# Patient Record
Sex: Female | Born: 1935 | Race: White | Hispanic: No | Smoking: Former smoker
Health system: Southern US, Community
[De-identification: ages and names within clinical notes are randomized; demographics above are authoritative.]

## PROBLEM LIST (undated history)

## (undated) DIAGNOSIS — Q2112 Patent foramen ovale: Secondary | ICD-10-CM

## (undated) DIAGNOSIS — M48061 Spinal stenosis, lumbar region without neurogenic claudication: Secondary | ICD-10-CM

## (undated) DIAGNOSIS — R55 Syncope and collapse: Secondary | ICD-10-CM

## (undated) DIAGNOSIS — I509 Heart failure, unspecified: Secondary | ICD-10-CM

## (undated) DIAGNOSIS — I499 Cardiac arrhythmia, unspecified: Secondary | ICD-10-CM

## (undated) DIAGNOSIS — E039 Hypothyroidism, unspecified: Secondary | ICD-10-CM

## (undated) DIAGNOSIS — K219 Gastro-esophageal reflux disease without esophagitis: Secondary | ICD-10-CM

## (undated) DIAGNOSIS — E119 Type 2 diabetes mellitus without complications: Secondary | ICD-10-CM

## (undated) DIAGNOSIS — I099 Rheumatic heart disease, unspecified: Secondary | ICD-10-CM

## (undated) DIAGNOSIS — Q211 Atrial septal defect: Secondary | ICD-10-CM

## (undated) DIAGNOSIS — I4891 Unspecified atrial fibrillation: Secondary | ICD-10-CM

## (undated) DIAGNOSIS — J449 Chronic obstructive pulmonary disease, unspecified: Secondary | ICD-10-CM

## (undated) HISTORY — PX: FOOT SURGERY: SHX648

## (undated) HISTORY — DX: Rheumatic heart disease, unspecified: I09.9

## (undated) HISTORY — DX: Unspecified atrial fibrillation: I48.91

## (undated) HISTORY — DX: Heart failure, unspecified: I50.9

## (undated) HISTORY — PX: EXPLORATORY LAPAROTOMY: SUR591

## (undated) HISTORY — DX: Chronic obstructive pulmonary disease, unspecified: J44.9

## (undated) HISTORY — DX: Patent foramen ovale: Q21.12

## (undated) HISTORY — DX: Atrial septal defect: Q21.1

## (undated) HISTORY — PX: ABDOMINAL HYSTERECTOMY: SHX81

## (undated) HISTORY — DX: Hypothyroidism, unspecified: E03.9

## (undated) HISTORY — PX: CHOLECYSTECTOMY: SHX55

## (undated) HISTORY — DX: Spinal stenosis, lumbar region without neurogenic claudication: M48.061

## (undated) HISTORY — PX: COLON SURGERY: SHX602

## (undated) HISTORY — DX: Cardiac arrhythmia, unspecified: I49.9

## (undated) HISTORY — DX: Gastro-esophageal reflux disease without esophagitis: K21.9

## (undated) HISTORY — PX: MITRAL VALVE REPLACEMENT: SHX147

---

## 1991-12-21 HISTORY — PX: PERCUTANEOUS BALLOON VALVULOPLASTY: SHX270

## 2006-10-11 ENCOUNTER — Ambulatory Visit: Payer: Self-pay | Admitting: Cardiology

## 2006-11-21 ENCOUNTER — Ambulatory Visit: Payer: Self-pay | Admitting: Cardiology

## 2006-11-24 ENCOUNTER — Ambulatory Visit: Payer: Self-pay | Admitting: Cardiology

## 2006-12-01 ENCOUNTER — Ambulatory Visit: Payer: Self-pay | Admitting: Cardiology

## 2006-12-08 ENCOUNTER — Ambulatory Visit: Payer: Self-pay | Admitting: Cardiology

## 2006-12-19 ENCOUNTER — Ambulatory Visit: Payer: Self-pay | Admitting: Cardiology

## 2007-01-02 ENCOUNTER — Ambulatory Visit: Payer: Self-pay | Admitting: Cardiology

## 2007-01-09 ENCOUNTER — Ambulatory Visit: Payer: Self-pay | Admitting: Cardiology

## 2007-01-17 ENCOUNTER — Ambulatory Visit: Payer: Self-pay | Admitting: Cardiology

## 2007-01-19 ENCOUNTER — Ambulatory Visit: Payer: Self-pay | Admitting: Cardiology

## 2007-01-30 ENCOUNTER — Ambulatory Visit: Payer: Self-pay | Admitting: Cardiology

## 2007-02-07 ENCOUNTER — Ambulatory Visit: Payer: Self-pay | Admitting: Cardiology

## 2007-02-15 ENCOUNTER — Ambulatory Visit: Payer: Self-pay | Admitting: Cardiology

## 2007-02-21 ENCOUNTER — Ambulatory Visit: Payer: Self-pay | Admitting: Cardiology

## 2007-03-01 ENCOUNTER — Ambulatory Visit: Payer: Self-pay | Admitting: Cardiology

## 2007-03-14 ENCOUNTER — Ambulatory Visit: Payer: Self-pay | Admitting: Cardiology

## 2007-03-17 ENCOUNTER — Ambulatory Visit: Payer: Self-pay | Admitting: Cardiology

## 2007-03-27 ENCOUNTER — Ambulatory Visit: Payer: Self-pay | Admitting: Cardiology

## 2007-04-11 ENCOUNTER — Ambulatory Visit: Payer: Self-pay | Admitting: Cardiology

## 2007-04-27 ENCOUNTER — Ambulatory Visit: Payer: Self-pay | Admitting: Cardiology

## 2007-05-04 ENCOUNTER — Ambulatory Visit: Payer: Self-pay | Admitting: Cardiology

## 2007-05-22 ENCOUNTER — Ambulatory Visit: Payer: Self-pay | Admitting: Cardiology

## 2007-05-31 ENCOUNTER — Ambulatory Visit: Payer: Self-pay | Admitting: Cardiology

## 2007-06-14 ENCOUNTER — Ambulatory Visit: Payer: Self-pay | Admitting: Cardiology

## 2007-06-20 ENCOUNTER — Ambulatory Visit: Payer: Self-pay | Admitting: Cardiology

## 2007-07-04 ENCOUNTER — Ambulatory Visit: Payer: Self-pay | Admitting: Cardiology

## 2007-07-11 ENCOUNTER — Ambulatory Visit: Payer: Self-pay | Admitting: Cardiology

## 2007-08-04 ENCOUNTER — Ambulatory Visit: Payer: Self-pay | Admitting: Cardiology

## 2007-08-30 ENCOUNTER — Ambulatory Visit: Payer: Self-pay | Admitting: Cardiology

## 2007-09-01 ENCOUNTER — Ambulatory Visit: Payer: Self-pay | Admitting: Cardiology

## 2007-09-27 ENCOUNTER — Ambulatory Visit: Payer: Self-pay | Admitting: Cardiology

## 2007-10-18 ENCOUNTER — Ambulatory Visit: Payer: Self-pay | Admitting: Cardiology

## 2007-11-14 ENCOUNTER — Ambulatory Visit: Payer: Self-pay | Admitting: Cardiology

## 2007-11-28 ENCOUNTER — Ambulatory Visit: Payer: Self-pay | Admitting: Cardiology

## 2007-12-20 ENCOUNTER — Ambulatory Visit: Payer: Self-pay | Admitting: Cardiology

## 2008-01-01 ENCOUNTER — Ambulatory Visit: Payer: Self-pay | Admitting: Cardiology

## 2008-01-02 ENCOUNTER — Inpatient Hospital Stay (HOSPITAL_COMMUNITY): Admission: AD | Admit: 2008-01-02 | Discharge: 2008-01-05 | Payer: Self-pay | Admitting: Internal Medicine

## 2008-01-02 ENCOUNTER — Ambulatory Visit: Payer: Self-pay | Admitting: Cardiology

## 2008-01-08 ENCOUNTER — Ambulatory Visit: Payer: Self-pay | Admitting: Internal Medicine

## 2008-01-12 ENCOUNTER — Ambulatory Visit: Payer: Self-pay | Admitting: Cardiology

## 2008-01-19 ENCOUNTER — Ambulatory Visit: Payer: Self-pay | Admitting: Cardiology

## 2008-02-02 ENCOUNTER — Ambulatory Visit: Payer: Self-pay | Admitting: Cardiology

## 2008-02-23 ENCOUNTER — Ambulatory Visit: Payer: Self-pay | Admitting: Cardiology

## 2008-03-18 ENCOUNTER — Ambulatory Visit: Payer: Self-pay | Admitting: Cardiology

## 2008-04-03 ENCOUNTER — Ambulatory Visit: Payer: Self-pay | Admitting: Cardiology

## 2008-05-01 ENCOUNTER — Ambulatory Visit: Payer: Self-pay | Admitting: Cardiology

## 2008-05-30 ENCOUNTER — Ambulatory Visit: Payer: Self-pay | Admitting: Cardiology

## 2008-07-18 ENCOUNTER — Ambulatory Visit: Payer: Self-pay | Admitting: Cardiology

## 2008-07-26 ENCOUNTER — Ambulatory Visit: Payer: Self-pay | Admitting: Cardiology

## 2008-08-16 ENCOUNTER — Ambulatory Visit: Payer: Self-pay | Admitting: Cardiology

## 2008-08-19 ENCOUNTER — Ambulatory Visit: Payer: Self-pay | Admitting: Cardiology

## 2008-09-03 ENCOUNTER — Encounter: Payer: Self-pay | Admitting: Physician Assistant

## 2008-09-03 ENCOUNTER — Ambulatory Visit: Payer: Self-pay | Admitting: Cardiology

## 2008-09-13 ENCOUNTER — Ambulatory Visit: Payer: Self-pay | Admitting: Cardiology

## 2008-09-26 ENCOUNTER — Ambulatory Visit: Payer: Self-pay | Admitting: Cardiology

## 2008-10-04 ENCOUNTER — Ambulatory Visit: Payer: Self-pay | Admitting: Cardiology

## 2008-10-08 ENCOUNTER — Ambulatory Visit: Payer: Self-pay | Admitting: Cardiology

## 2008-10-15 ENCOUNTER — Ambulatory Visit: Payer: Self-pay | Admitting: Cardiology

## 2008-10-29 ENCOUNTER — Ambulatory Visit: Payer: Self-pay | Admitting: Cardiology

## 2008-11-08 ENCOUNTER — Ambulatory Visit: Payer: Self-pay | Admitting: Cardiology

## 2008-11-26 ENCOUNTER — Ambulatory Visit: Payer: Self-pay | Admitting: Cardiology

## 2008-12-10 ENCOUNTER — Ambulatory Visit: Payer: Self-pay | Admitting: Cardiology

## 2009-01-03 ENCOUNTER — Ambulatory Visit: Payer: Self-pay | Admitting: Cardiology

## 2009-01-10 ENCOUNTER — Ambulatory Visit: Payer: Self-pay | Admitting: Cardiology

## 2009-01-30 ENCOUNTER — Encounter: Payer: Self-pay | Admitting: Cardiology

## 2009-02-04 ENCOUNTER — Ambulatory Visit: Payer: Self-pay | Admitting: Cardiology

## 2009-02-28 ENCOUNTER — Ambulatory Visit: Payer: Self-pay | Admitting: Cardiology

## 2009-03-17 ENCOUNTER — Ambulatory Visit: Payer: Self-pay | Admitting: Cardiology

## 2009-04-01 ENCOUNTER — Ambulatory Visit: Payer: Self-pay | Admitting: Cardiology

## 2009-04-03 ENCOUNTER — Ambulatory Visit: Payer: Self-pay | Admitting: Cardiology

## 2009-04-18 ENCOUNTER — Ambulatory Visit: Payer: Self-pay | Admitting: Cardiology

## 2009-04-24 ENCOUNTER — Ambulatory Visit: Payer: Self-pay | Admitting: Cardiology

## 2009-04-30 ENCOUNTER — Inpatient Hospital Stay (HOSPITAL_BASED_OUTPATIENT_CLINIC_OR_DEPARTMENT_OTHER): Admission: RE | Admit: 2009-04-30 | Discharge: 2009-04-30 | Payer: Self-pay | Admitting: Cardiology

## 2009-04-30 ENCOUNTER — Ambulatory Visit: Payer: Self-pay | Admitting: Cardiology

## 2009-05-02 ENCOUNTER — Encounter: Payer: Self-pay | Admitting: Cardiology

## 2009-05-06 ENCOUNTER — Ambulatory Visit: Payer: Self-pay | Admitting: Cardiology

## 2009-05-12 ENCOUNTER — Encounter
Admission: RE | Admit: 2009-05-12 | Discharge: 2009-05-12 | Payer: Self-pay | Admitting: Thoracic Surgery (Cardiothoracic Vascular Surgery)

## 2009-05-12 ENCOUNTER — Ambulatory Visit: Payer: Self-pay | Admitting: Thoracic Surgery (Cardiothoracic Vascular Surgery)

## 2009-05-12 ENCOUNTER — Encounter: Payer: Self-pay | Admitting: Cardiology

## 2009-05-15 ENCOUNTER — Ambulatory Visit: Payer: Self-pay | Admitting: Cardiology

## 2009-05-20 ENCOUNTER — Encounter: Payer: Self-pay | Admitting: Thoracic Surgery (Cardiothoracic Vascular Surgery)

## 2009-05-20 ENCOUNTER — Ambulatory Visit
Admission: RE | Admit: 2009-05-20 | Discharge: 2009-05-20 | Payer: Self-pay | Admitting: Thoracic Surgery (Cardiothoracic Vascular Surgery)

## 2009-05-20 ENCOUNTER — Ambulatory Visit (HOSPITAL_COMMUNITY)
Admission: RE | Admit: 2009-05-20 | Discharge: 2009-05-20 | Payer: Self-pay | Admitting: Thoracic Surgery (Cardiothoracic Vascular Surgery)

## 2009-05-20 ENCOUNTER — Encounter: Payer: Self-pay | Admitting: Cardiology

## 2009-05-23 ENCOUNTER — Ambulatory Visit: Payer: Self-pay | Admitting: Thoracic Surgery (Cardiothoracic Vascular Surgery)

## 2009-05-23 ENCOUNTER — Inpatient Hospital Stay (HOSPITAL_COMMUNITY)
Admission: RE | Admit: 2009-05-23 | Discharge: 2009-05-30 | Payer: Self-pay | Admitting: Thoracic Surgery (Cardiothoracic Vascular Surgery)

## 2009-05-23 ENCOUNTER — Encounter: Payer: Self-pay | Admitting: Thoracic Surgery (Cardiothoracic Vascular Surgery)

## 2009-05-24 ENCOUNTER — Encounter: Payer: Self-pay | Admitting: Cardiology

## 2009-05-26 ENCOUNTER — Encounter: Payer: Self-pay | Admitting: Cardiology

## 2009-05-27 ENCOUNTER — Encounter: Payer: Self-pay | Admitting: Cardiology

## 2009-05-29 ENCOUNTER — Encounter: Payer: Self-pay | Admitting: Cardiology

## 2009-05-30 ENCOUNTER — Encounter: Payer: Self-pay | Admitting: Cardiology

## 2009-06-10 ENCOUNTER — Encounter: Payer: Self-pay | Admitting: Cardiology

## 2009-06-10 ENCOUNTER — Ambulatory Visit: Payer: Self-pay | Admitting: Cardiology

## 2009-06-10 ENCOUNTER — Telehealth: Payer: Self-pay | Admitting: Cardiology

## 2009-06-13 ENCOUNTER — Encounter
Admission: RE | Admit: 2009-06-13 | Discharge: 2009-06-13 | Payer: Self-pay | Admitting: Thoracic Surgery (Cardiothoracic Vascular Surgery)

## 2009-06-13 ENCOUNTER — Ambulatory Visit: Payer: Self-pay | Admitting: Thoracic Surgery (Cardiothoracic Vascular Surgery)

## 2009-06-17 ENCOUNTER — Ambulatory Visit: Payer: Self-pay | Admitting: Cardiology

## 2009-06-20 ENCOUNTER — Ambulatory Visit: Payer: Self-pay | Admitting: Cardiology

## 2009-06-24 ENCOUNTER — Ambulatory Visit: Payer: Self-pay | Admitting: Cardiology

## 2009-07-15 ENCOUNTER — Ambulatory Visit: Payer: Self-pay | Admitting: Cardiology

## 2009-07-21 ENCOUNTER — Ambulatory Visit: Payer: Self-pay | Admitting: Thoracic Surgery (Cardiothoracic Vascular Surgery)

## 2009-08-01 ENCOUNTER — Ambulatory Visit: Payer: Self-pay | Admitting: Cardiology

## 2009-08-01 LAB — CONVERTED CEMR LAB: POC INR: 1.5

## 2009-08-04 ENCOUNTER — Encounter: Payer: Self-pay | Admitting: *Deleted

## 2009-08-08 ENCOUNTER — Ambulatory Visit: Payer: Self-pay | Admitting: Cardiology

## 2009-08-08 LAB — CONVERTED CEMR LAB: Prothrombin Time: 16.4 s

## 2009-08-20 DIAGNOSIS — R0989 Other specified symptoms and signs involving the circulatory and respiratory systems: Secondary | ICD-10-CM | POA: Insufficient documentation

## 2009-08-26 ENCOUNTER — Ambulatory Visit: Payer: Self-pay | Admitting: Cardiology

## 2009-09-19 ENCOUNTER — Ambulatory Visit: Payer: Self-pay | Admitting: Cardiology

## 2009-09-19 LAB — CONVERTED CEMR LAB: POC INR: 2.1

## 2009-10-06 ENCOUNTER — Encounter: Payer: Self-pay | Admitting: Cardiology

## 2009-10-07 ENCOUNTER — Telehealth (INDEPENDENT_AMBULATORY_CARE_PROVIDER_SITE_OTHER): Payer: Self-pay | Admitting: *Deleted

## 2009-10-07 ENCOUNTER — Encounter: Payer: Self-pay | Admitting: Physician Assistant

## 2009-10-12 ENCOUNTER — Encounter: Payer: Self-pay | Admitting: Cardiology

## 2009-10-13 ENCOUNTER — Ambulatory Visit: Payer: Self-pay | Admitting: Cardiology

## 2009-10-13 ENCOUNTER — Encounter: Payer: Self-pay | Admitting: Cardiology

## 2009-10-17 ENCOUNTER — Ambulatory Visit: Payer: Self-pay | Admitting: Cardiology

## 2009-10-17 LAB — CONVERTED CEMR LAB: POC INR: 1.3

## 2009-10-23 ENCOUNTER — Telehealth (INDEPENDENT_AMBULATORY_CARE_PROVIDER_SITE_OTHER): Payer: Self-pay | Admitting: *Deleted

## 2009-10-31 ENCOUNTER — Ambulatory Visit: Payer: Self-pay | Admitting: Cardiology

## 2009-11-10 ENCOUNTER — Ambulatory Visit: Payer: Self-pay | Admitting: Cardiology

## 2009-11-10 DIAGNOSIS — M4800 Spinal stenosis, site unspecified: Secondary | ICD-10-CM

## 2009-11-10 DIAGNOSIS — Z9889 Other specified postprocedural states: Secondary | ICD-10-CM

## 2009-11-10 DIAGNOSIS — J4489 Other specified chronic obstructive pulmonary disease: Secondary | ICD-10-CM | POA: Insufficient documentation

## 2009-11-10 DIAGNOSIS — E039 Hypothyroidism, unspecified: Secondary | ICD-10-CM | POA: Insufficient documentation

## 2009-11-10 DIAGNOSIS — I099 Rheumatic heart disease, unspecified: Secondary | ICD-10-CM | POA: Insufficient documentation

## 2009-11-10 DIAGNOSIS — I509 Heart failure, unspecified: Secondary | ICD-10-CM | POA: Insufficient documentation

## 2009-11-10 DIAGNOSIS — Z8679 Personal history of other diseases of the circulatory system: Secondary | ICD-10-CM | POA: Insufficient documentation

## 2009-11-10 DIAGNOSIS — J449 Chronic obstructive pulmonary disease, unspecified: Secondary | ICD-10-CM

## 2009-11-12 IMAGING — CR DG CHEST 2V
2 series · 2 of 2 positions shown · non-contrast
Comparison: Chest x-ray of 05/29/2009

CLINICAL DATA: Status post mitral valve replacement, follow

CHEST - 2 VIEW

[w chest ap]
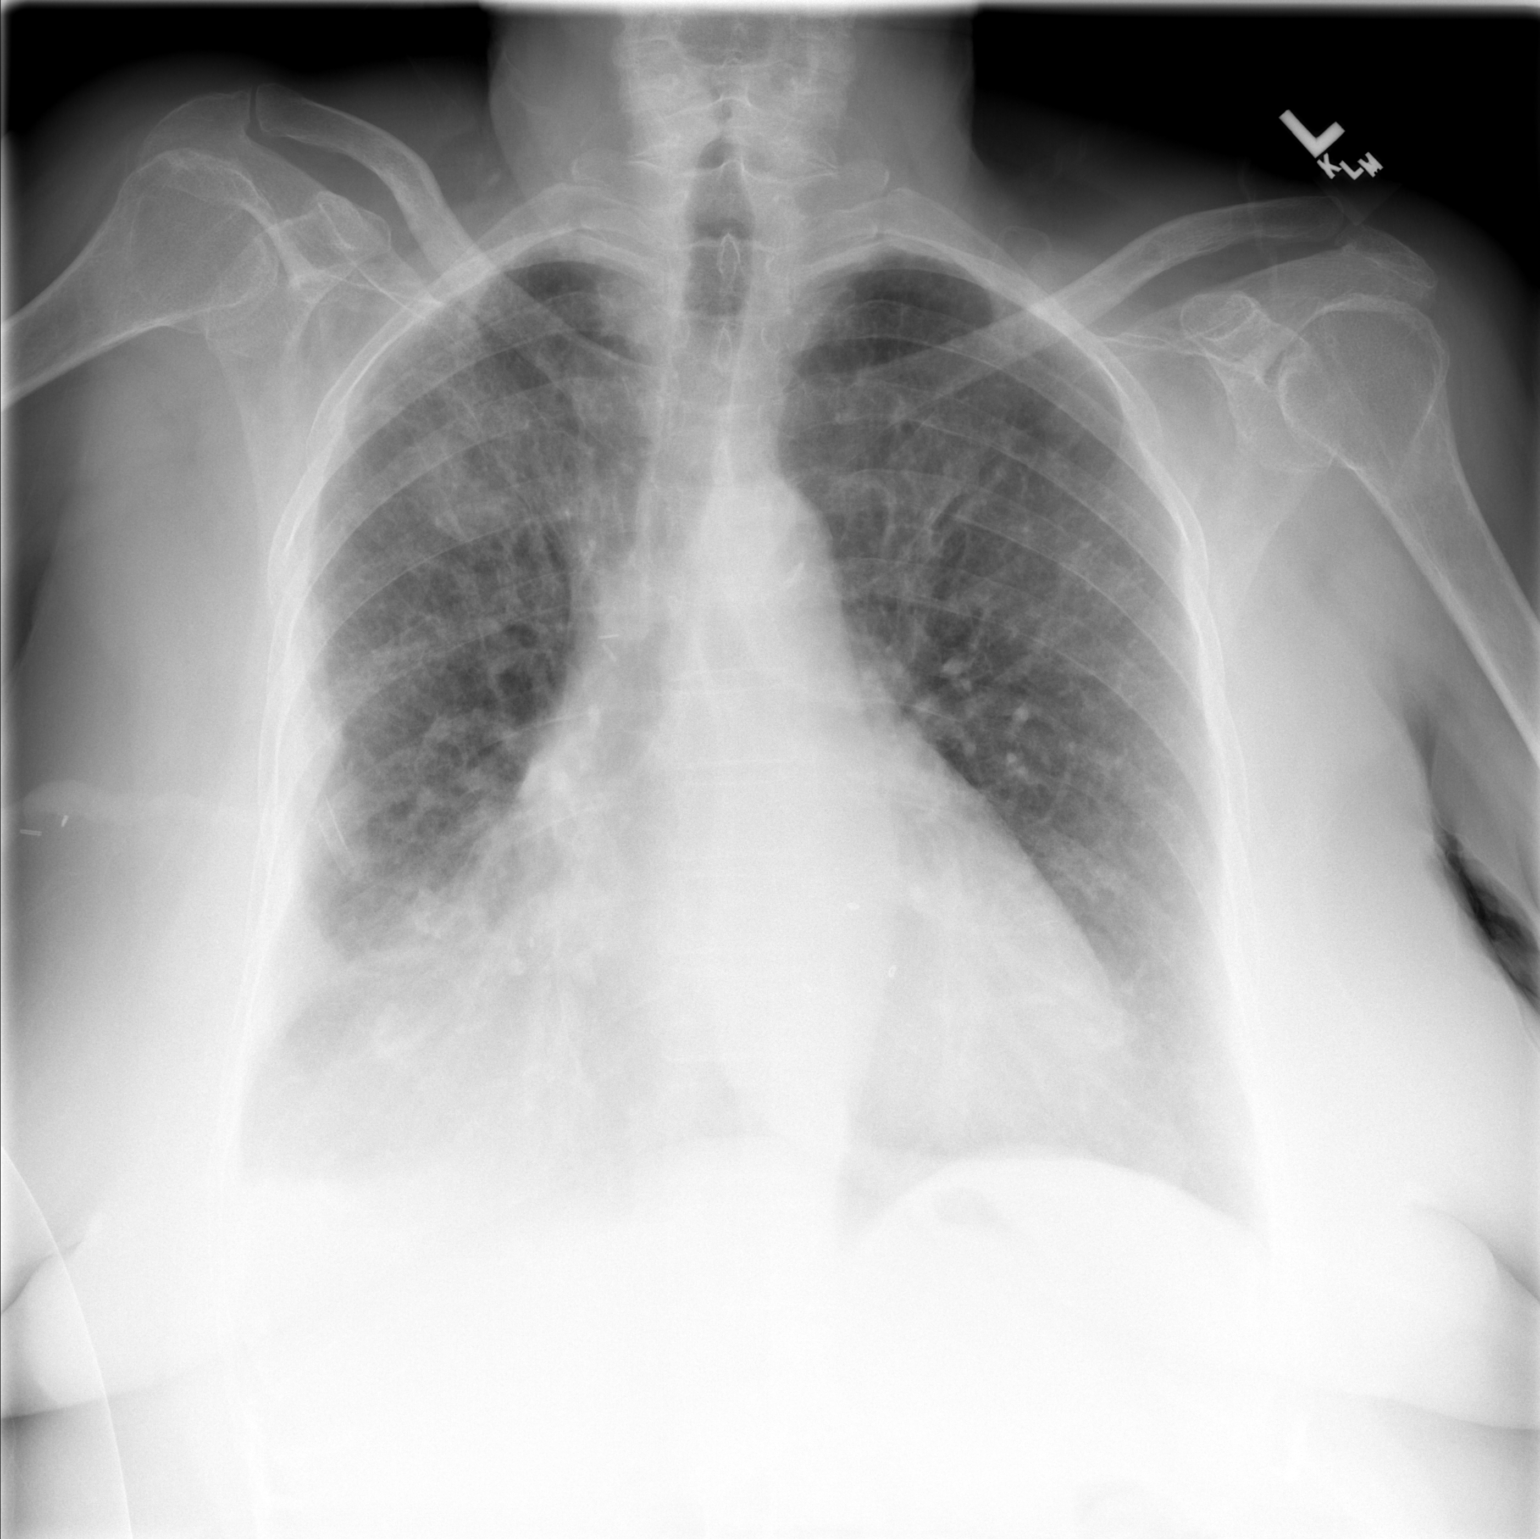

[w chest lat]
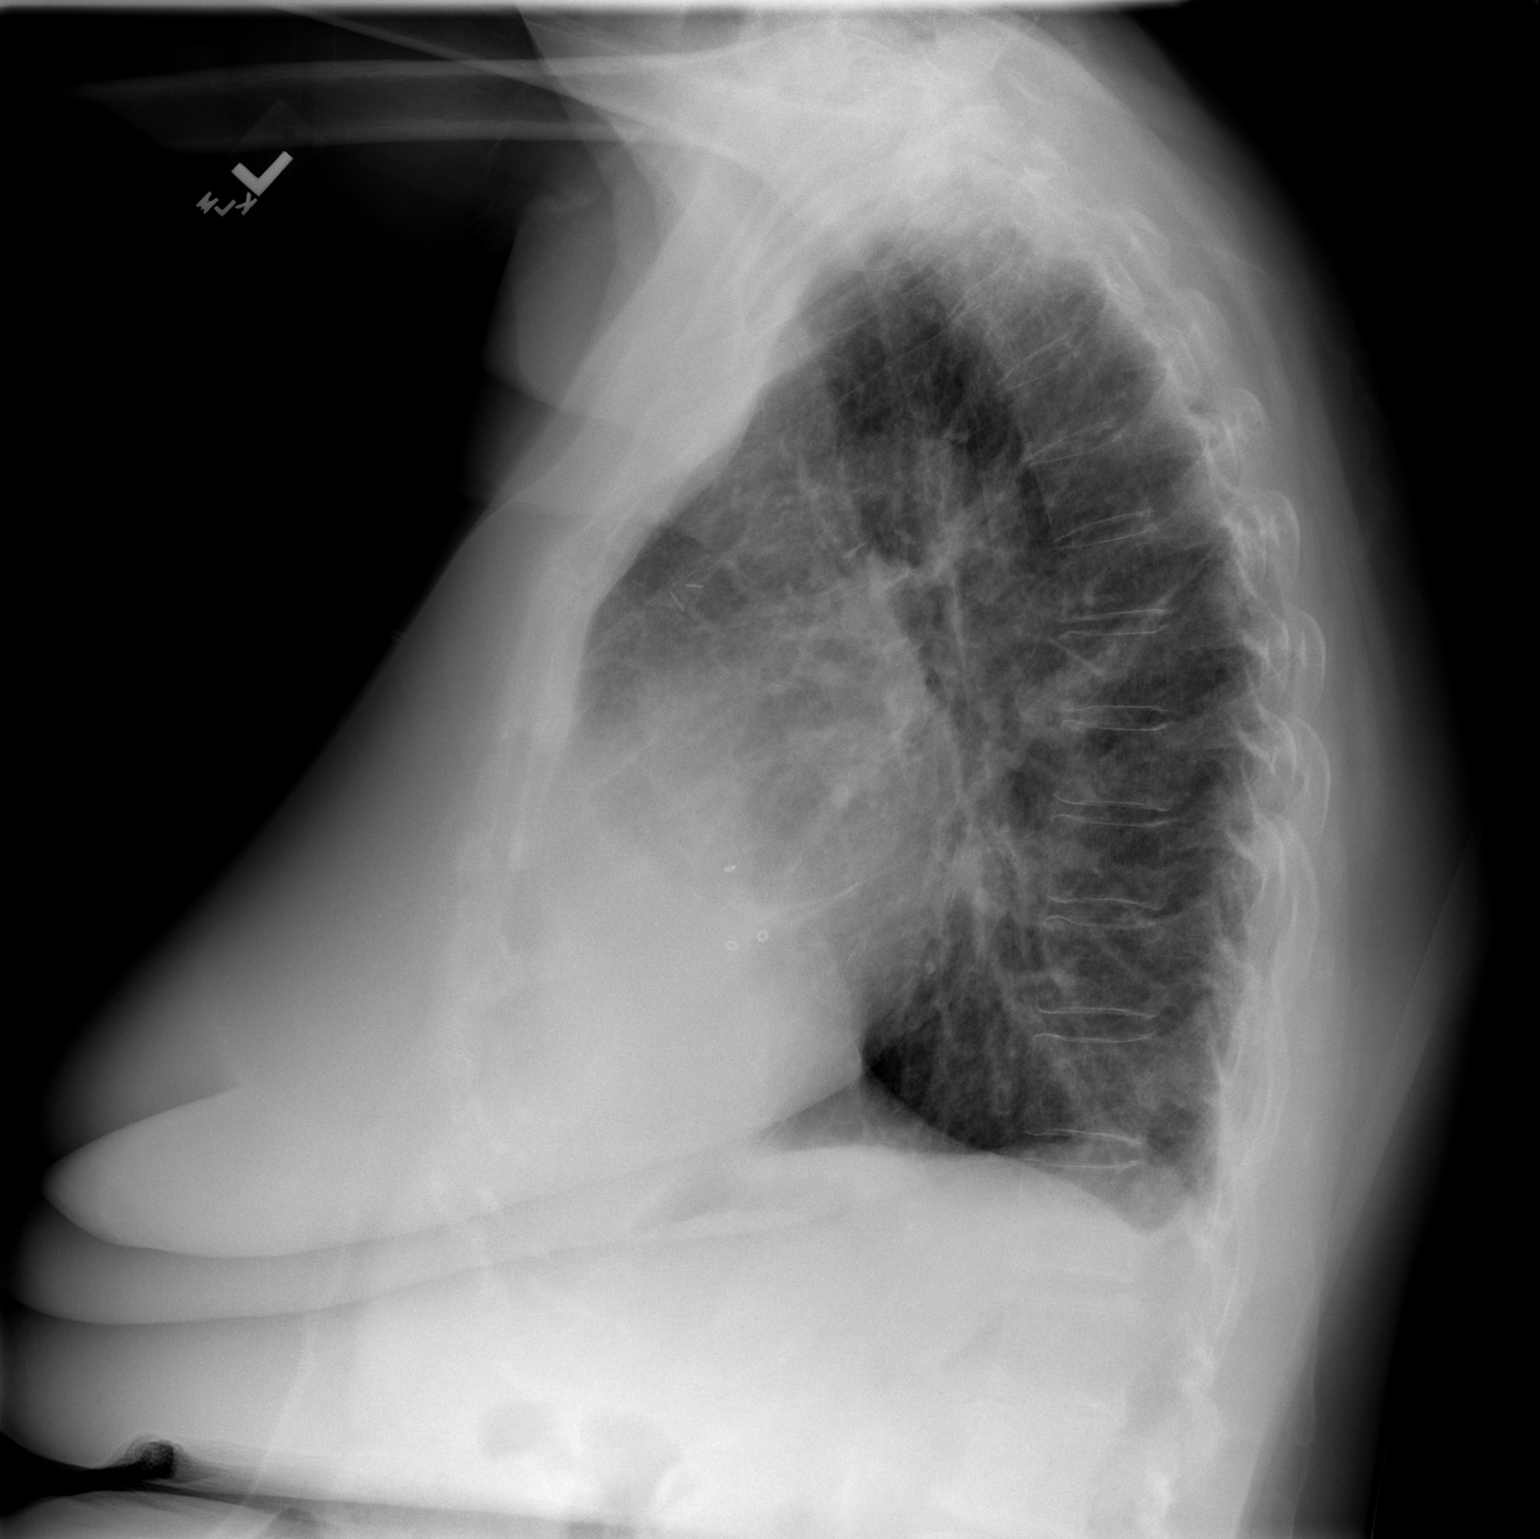

[2 of 2 positions shown; findings below may reference images not displayed]

FINDINGS: The edema pattern has improved although moderate
pulmonary vascular congestion remains.  Only small pleural
effusions are present.  Cardiomegaly is stable.  The bones are
osteopenic.
IMPRESSION: Improvement in pulmonary vascular congestion with decreasing small
effusions.

## 2009-11-16 ENCOUNTER — Encounter: Payer: Self-pay | Admitting: Cardiology

## 2009-11-21 ENCOUNTER — Encounter: Payer: Self-pay | Admitting: Cardiology

## 2009-11-26 ENCOUNTER — Telehealth (INDEPENDENT_AMBULATORY_CARE_PROVIDER_SITE_OTHER): Payer: Self-pay | Admitting: *Deleted

## 2009-12-02 ENCOUNTER — Ambulatory Visit: Payer: Self-pay | Admitting: Cardiology

## 2009-12-02 LAB — CONVERTED CEMR LAB: POC INR: 1.6

## 2009-12-23 ENCOUNTER — Ambulatory Visit: Payer: Self-pay | Admitting: Cardiology

## 2009-12-23 LAB — CONVERTED CEMR LAB: POC INR: 1.5

## 2010-01-02 ENCOUNTER — Ambulatory Visit: Payer: Self-pay | Admitting: Cardiology

## 2010-01-02 LAB — CONVERTED CEMR LAB: POC INR: 2.9

## 2010-01-23 ENCOUNTER — Ambulatory Visit: Payer: Self-pay | Admitting: Cardiology

## 2010-02-09 ENCOUNTER — Ambulatory Visit: Payer: Self-pay | Admitting: Thoracic Surgery (Cardiothoracic Vascular Surgery)

## 2010-02-14 ENCOUNTER — Encounter: Payer: Self-pay | Admitting: Cardiology

## 2010-02-20 ENCOUNTER — Ambulatory Visit: Payer: Self-pay | Admitting: Cardiology

## 2010-03-20 ENCOUNTER — Ambulatory Visit: Payer: Self-pay | Admitting: Cardiology

## 2010-04-14 ENCOUNTER — Ambulatory Visit: Payer: Self-pay | Admitting: Cardiology

## 2010-04-20 ENCOUNTER — Ambulatory Visit: Payer: Self-pay | Admitting: Cardiology

## 2010-04-20 DIAGNOSIS — F341 Dysthymic disorder: Secondary | ICD-10-CM | POA: Insufficient documentation

## 2010-05-12 ENCOUNTER — Ambulatory Visit: Payer: Self-pay | Admitting: Cardiology

## 2010-05-20 ENCOUNTER — Encounter (INDEPENDENT_AMBULATORY_CARE_PROVIDER_SITE_OTHER): Payer: Self-pay | Admitting: *Deleted

## 2010-06-12 ENCOUNTER — Ambulatory Visit: Payer: Self-pay | Admitting: Cardiology

## 2010-06-12 LAB — CONVERTED CEMR LAB: POC INR: 1.8

## 2010-07-07 ENCOUNTER — Ambulatory Visit: Payer: Self-pay | Admitting: Cardiology

## 2010-07-07 LAB — CONVERTED CEMR LAB: POC INR: 2

## 2010-08-04 ENCOUNTER — Ambulatory Visit: Payer: Self-pay | Admitting: Cardiology

## 2010-08-25 ENCOUNTER — Ambulatory Visit: Payer: Self-pay | Admitting: Cardiology

## 2010-08-25 LAB — CONVERTED CEMR LAB: POC INR: 2.7

## 2010-09-22 ENCOUNTER — Ambulatory Visit: Payer: Self-pay | Admitting: Cardiology

## 2010-09-22 LAB — CONVERTED CEMR LAB: POC INR: 2

## 2010-10-20 ENCOUNTER — Ambulatory Visit: Payer: Self-pay | Admitting: Cardiology

## 2010-10-20 LAB — CONVERTED CEMR LAB: POC INR: 1.9

## 2010-11-17 ENCOUNTER — Ambulatory Visit: Payer: Self-pay | Admitting: Cardiology

## 2010-12-18 ENCOUNTER — Ambulatory Visit: Payer: Self-pay | Admitting: Cardiology

## 2010-12-18 LAB — CONVERTED CEMR LAB: POC INR: 1.8

## 2011-01-11 ENCOUNTER — Encounter: Payer: Self-pay | Admitting: Thoracic Surgery (Cardiothoracic Vascular Surgery)

## 2011-01-12 ENCOUNTER — Ambulatory Visit: Admission: RE | Admit: 2011-01-12 | Discharge: 2011-01-12 | Payer: Self-pay | Source: Home / Self Care

## 2011-01-19 NOTE — Medication Information (Signed)
Summary: ccr-lr  Anticoagulant Therapy  Managed by: Vashti Hey, RN Referring MD: Lewayne Bunting PCP: Laural Roes MD: Andee Lineman MD, Michelle Piper Indication 1: Atrial Fibrillation (ICD-427.31) [facility] Site: [name] INR POC 2.2  Dietary changes: no    Health status changes: no    Bleeding/hemorrhagic complications: no    Recent/future hospitalizations: no    Any changes in medication regimen? no    Recent/future dental: no  Any missed doses?: no       Is patient compliant with meds? yes       Allergies: 1)  Sulfa 2)  Asa  Anticoagulation Management History:      The patient is taking warfarin and comes in today for a routine follow up visit.  Positive risk factors for bleeding include an age of 75 years or older.  The bleeding index is 'intermediate risk'.  Positive CHADS2 values include History of CHF and Age > 98 years old.  The start date was 11/19/2006.  Anticoagulation responsible provider: Andee Lineman MD, Michelle Piper.  INR POC: 2.2.  Cuvette Lot#: [id number].  Exp: 10/11.    Anticoagulation Management Assessment/Plan:      The patient's current anticoagulation dose is Warfarin sodium 5 mg tabs: Take by mouth as directed.  The target INR is 2.0-3.0.  The next INR is due 06/09/2010.  Anticoagulation instructions were given to patient.  Results were reviewed/authorized by Vashti Hey, RN.  She was notified by Vashti Hey RN.         Prior Anticoagulation Instructions: INR 3.5 Hold coumadin tonight then decrease dose to 5mg  once daily except 2.5mg  on Fridays  Current Anticoagulation Instructions: INR 2.2 Continue coumadin 5mg  once daily except 2.5mg  on Fridays

## 2011-01-19 NOTE — Medication Information (Signed)
Summary: ccr-lr  Anticoagulant Therapy  Managed by: Vashti Hey, RN Referring MD: Lewayne Bunting PCP: Laural Roes MD: Antoine Poche MD, Fayrene Fearing Indication 1: Atrial Fibrillation (ICD-427.31) Lab [facility] Site: [name] INR POC 2.7  Dietary changes: no    Health status changes: no    Bleeding/hemorrhagic complications: no    Recent/future hospitalizations: no    Any changes in medication regimen? no    Recent/future dental: no  Any missed doses?: no       Is patient compliant with meds? yes       Allergies: 1)  Sulfa 2)  Asa  Anticoagulation Management History:      The patient is taking warfarin and comes in today for a routine follow up visit.  Positive risk factors for bleeding include an age of 48 years or older.  The bleeding index is 'intermediate risk'.  Positive CHADS2 values include History of CHF and Age > 58 years old.  The start date was 11/19/2006.  Anticoagulation responsible provider: Antoine Poche MD, Fayrene Fearing.  INR POC: 2.7.  Cuvette Lot#: [id number].  Exp: 10/11.    Anticoagulation Management Assessment/Plan:      The patient's current anticoagulation dose is Warfarin sodium 5 mg tabs: Take by mouth as directed.  The target INR is 2.0-3.0.  The next INR is due 09/22/2010.  Anticoagulation instructions were given to patient.  Results were reviewed/authorized by Vashti Hey, RN.  She was notified by Vashti Hey RN.         Prior Anticoagulation Instructions: INR 1.6 Increase coumadin to 5mg  once daily except 7.5mg  on Tuesdays and Fridays  Current Anticoagulation Instructions: INR 2.7 Continue coumadin 5mg  once daily except 7.5mg  on Tuesdays and Fridays Prescriptions: WARFARIN SODIUM 5 MG TABS (WARFARIN SODIUM) Take by mouth as directed  #45 x 2   Entered by:   Vashti Hey RN   Authorized by:   Lewayne Bunting, MD, [facility]   Signed by:   Vashti Hey RN on 08/25/2010   Method used:   Electronically to        [facility] (retail)       [address]       [place], [place]  [address]       Ph: [phone]       Fax: [phone]   RxID:   [id number]

## 2011-01-19 NOTE — Medication Information (Signed)
Summary: ccr-lr  Anticoagulant Therapy  Managed by: Vashti Hey, RN Referring MD: Lewayne Bunting PCP: Laural Roes MD: Antoine Poche MD, Fayrene Fearing Indication 1: Atrial Fibrillation (ICD-427.31) Lab [facility] Site: [name] INR POC 1.6  Dietary changes: no    Health status changes: no    Bleeding/hemorrhagic complications: no    Recent/future hospitalizations: no    Any changes in medication regimen? no    Recent/future dental: no  Any missed doses?: no       Is patient compliant with meds? yes       Allergies: 1)  Sulfa 2)  Asa  Anticoagulation Management History:      The patient is taking warfarin and comes in today for a routine follow up visit.  Positive risk factors for bleeding include an age of 17 years or older.  The bleeding index is 'intermediate risk'.  Positive CHADS2 values include History of CHF and Age > 64 years old.  The start date was 11/19/2006.  Anticoagulation responsible Aaren [place]: Antoine Poche MD, Fayrene Fearing.  INR POC: 1.6.  Cuvette Lot#: [id number].  Exp: 10/11.    Anticoagulation Management Assessment/Plan:      The patient's current anticoagulation dose is Warfarin sodium 5 mg tabs: Take by mouth as directed.  The target INR is 2.0-3.0.  The next INR is due 08/25/2010.  Anticoagulation instructions were given to patient.  Results were reviewed/authorized by Vashti Hey, RN.  She was notified by Vashti Hey RN.         Prior Anticoagulation Instructions: INR 2.0 Continue coumadin 5mg  once daily   Current Anticoagulation Instructions: INR 1.6 Increase coumadin to 5mg  once daily except 7.5mg  on Tuesdays and Fridays

## 2011-01-19 NOTE — Medication Information (Signed)
Summary: ccr-lr  Anticoagulant Therapy  Managed by: Vashti Hey, RN Referring MD: Lewayne Bunting PCP: Laural Roes MD: Diona Browner MD, Remi Deter Indication 1: Atrial Fibrillation (ICD-427.31) [facility] Site: [name] INR POC 2.3  Dietary changes: no    Health status changes: no    Bleeding/hemorrhagic complications: no    Recent/future hospitalizations: no    Any changes in medication regimen? no    Recent/future dental: no  Any missed doses?: no       Is patient compliant with meds? yes       Allergies: 1)  Sulfa 2)  Asa  Anticoagulation Management History:      The patient is taking warfarin and comes in today for a routine follow up visit.  Positive risk factors for bleeding include an age of 75 years or older.  The bleeding index is 'intermediate risk'.  Positive CHADS2 values include History of CHF and Age > 75 years old.  The start date was 11/19/2006.  Anticoagulation responsible provider: Diona Browner MD, Remi Deter.  INR POC: 2.3.  Cuvette Lot#: [id number].  Exp: 10/11.    Anticoagulation Management Assessment/Plan:      The patient's current anticoagulation dose is Warfarin sodium 5 mg tabs: Take by mouth as directed.  The target INR is 2.0-3.0.  The next INR is due 02/20/2010.  Anticoagulation instructions were given to patient.  Results were reviewed/authorized by Vashti Hey, RN.  She was notified by Vashti Hey RN.         Prior Anticoagulation Instructions: INR 2.9 Continue coumadin 5mg  once daily   Current Anticoagulation Instructions: INR 2.3 Continue coumadin 5mg  once daily

## 2011-01-19 NOTE — Medication Information (Signed)
Summary: ccr-lr  Anticoagulant Therapy  Managed by: Vashti Hey, RN Referring MD: Lewayne Bunting PCP: Laural Roes MD: Andee Lineman MD, Michelle Piper Indication 1: Atrial Fibrillation (ICD-427.31) Lab [facility] [place] Site: [name] INR POC 2.2  Dietary changes: no    Health status changes: yes       Details: has shingles  Bleeding/hemorrhagic complications: no    Recent/future hospitalizations: no    Any changes in medication regimen? yes       Details: started on zovirax 800mg  1 tablet 5 x day  Recent/future dental: no  Any missed doses?: no       Is patient compliant with meds? yes       Allergies: 1)  Sulfa 2)  Asa  Anticoagulation Management History:      The patient is taking warfarin and comes in today for a routine follow up visit.  Positive risk factors for bleeding include an age of 75 years or older.  The bleeding index is 'intermediate risk'.  Positive CHADS2 values include History of CHF and Age > 31 years old.  The start date was 11/19/2006.  Anticoagulation responsible provider: Andee Lineman MD, Michelle Piper.  INR POC: 2.2.  Cuvette Lot#: [id number].  Exp: 10/11.    Anticoagulation Management Assessment/Plan:      The patient's current anticoagulation dose is Warfarin sodium 5 mg tabs: Take by mouth as directed.  The target INR is 2.0-3.0.  The next INR is due 03/20/2010.  Anticoagulation instructions were given to patient.  Results were reviewed/authorized by Vashti Hey, RN.  She was notified by Vashti Hey RN.         Prior Anticoagulation Instructions: INR 2.3 Continue coumadin 5mg  once daily   Current Anticoagulation Instructions: INR 2.2 Continue coumadin 5mg  once daily

## 2011-01-19 NOTE — Medication Information (Signed)
Summary: ccr-lr  Anticoagulant Therapy  Managed by: Vashti Hey, RN Referring MD: Lewayne Bunting PCP: Laural Roes MD: Andee Lineman MD, Michelle Piper Indication 1: Atrial Fibrillation (ICD-427.31) Lab [facility] Site: [name] INR POC 3.5  Dietary changes: no    Health status changes: no    Bleeding/hemorrhagic complications: no    Recent/future hospitalizations: no    Any changes in medication regimen? no    Recent/future dental: no  Any missed doses?: no       Is patient compliant with meds? yes       Allergies: 1)  Sulfa 2)  Asa  Anticoagulation Management History:      The patient is taking warfarin and comes in today for a routine follow up visit.  Positive risk factors for bleeding include an age of 75 years or older.  The bleeding index is 'intermediate risk'.  Positive CHADS2 values include History of CHF and Age > 75 years old.  The start date was 11/19/2006.  Anticoagulation responsible provider: Andee Lineman MD, Michelle Piper.  INR POC: 3.5.  Exp: 10/11.    Anticoagulation Management Assessment/Plan:      The patient's current anticoagulation dose is Warfarin sodium 5 mg tabs: Take by mouth as directed.  The target INR is 2.0-3.0.  The next INR is due 05/12/2010.  Anticoagulation instructions were given to patient.  Results were reviewed/authorized by Vashti Hey, RN.  She was notified by Vashti Hey RN.         Prior Anticoagulation Instructions: INR 3.4 Hold coumadin tonight then resume 5mg  once daily   Current Anticoagulation Instructions: INR 3.5 Hold coumadin tonight then decrease dose to 5mg  once daily except 2.5mg  on Fridays

## 2011-01-19 NOTE — Medication Information (Signed)
Summary: ccr-lr  Anticoagulant Therapy  Managed by: Weston Brass, PharmD Referring MD: Lewayne Bunting PCP: Laural Roes MD: Andee Lineman MD, Michelle Piper Indication 1: Atrial Fibrillation (ICD-427.31) [facility]  Site: [name] INR POC 1.8  Dietary changes: no    Health status changes: no    Bleeding/hemorrhagic complications: no    Recent/future hospitalizations: no    Any changes in medication regimen? no    Recent/future dental: no  Any missed doses?: no       Is patient compliant with meds? yes       Allergies: 1)  Sulfa 2)  Asa  Anticoagulation Management History:      The patient is taking warfarin and comes in today for a routine follow up visit.  Positive risk factors for bleeding include an age of 75 years or older.  The bleeding index is 'intermediate risk'.  Positive CHADS2 values include History of CHF and Age > 69 years old.  The start date was 11/19/2006.  Anticoagulation responsible provider: Andee Lineman MD, Michelle Piper.  INR POC: 1.8.  Cuvette Lot#: [id number].  Exp: 10/11.    Anticoagulation Management Assessment/Plan:      The patient's current anticoagulation dose is Warfarin sodium 5 mg tabs: Take by mouth as directed.  The target INR is 2.0-3.0.  The next INR is due 07/03/2010.  Anticoagulation instructions were given to patient.  Results were reviewed/authorized by Weston Brass, PharmD.  She was notified by Weston Brass PharmD.         Prior Anticoagulation Instructions: INR 2.2 Continue coumadin 5mg  once daily except 2.5mg  on Fridays  Current Anticoagulation Instructions: INR 1.8  Increase dose to 1 tablet every day.  Recheck in 3 weeks.

## 2011-01-19 NOTE — Medication Information (Signed)
Summary: ccr-lr  Anticoagulant Therapy  Managed by: Vashti Hey, RN Referring MD: Lewayne Bunting PCP: Laural Roes MD: Diona Browner MD, Remi Deter Indication 1: Atrial Fibrillation (ICD-427.31) Lab [facility] [place] [facility] Site: [name] INR POC 3.4  Dietary changes: no    Health status changes: no    Bleeding/hemorrhagic complications: no    Recent/future hospitalizations: no    Any changes in medication regimen? no    Recent/future dental: no  Any missed doses?: no       Is patient compliant with meds? yes       Allergies: 1)  Sulfa 2)  Asa  Anticoagulation Management History:      The patient is taking warfarin and comes in today for a routine follow up visit.  Positive risk factors for bleeding include an age of 75 years or older.  The bleeding index is 'intermediate risk'.  Positive CHADS2 values include History of CHF and Age > 72 years old.  The start date was 11/19/2006.  Anticoagulation responsible provider: Diona Browner MD, Remi Deter.  INR POC: 3.4.  Cuvette Lot#: [id number].  Exp: 10/11.    Anticoagulation Management Assessment/Plan:      The patient's current anticoagulation dose is Warfarin sodium 5 mg tabs: Take by mouth as directed.  The target INR is 2.0-3.0.  The next INR is due 04/10/2010.  Anticoagulation instructions were given to patient.  Results were reviewed/authorized by Vashti Hey, RN.  She was notified by Vashti Hey RN.         Prior Anticoagulation Instructions: INR 2.2 Continue coumadin 5mg  once daily   Current Anticoagulation Instructions: INR 3.4 Hold coumadin tonight then resume 5mg  once daily

## 2011-01-19 NOTE — Medication Information (Signed)
Summary: ccr-lr  Anticoagulant Therapy  Managed by: Vashti Hey, RN Referring MD: Lewayne Bunting PCP: Laural Roes MD: Andee Lineman MD, Michelle Piper Indication 1: Atrial Fibrillation (ICD-427.31) Lab [facility] [place] Site: [name] INR POC 1.5  Dietary changes: no    Health status changes: no    Bleeding/hemorrhagic complications: no    Recent/future hospitalizations: no    Any changes in medication regimen? no    Recent/future dental: no  Any missed doses?: yes     Details: pt denies missing doses but INR continues to drop inspite of increasing dose  Is patient compliant with meds? yes       Allergies: 1)  Sulfa 2)  Asa  Anticoagulation Management History:      The patient is taking warfarin and comes in today for a routine follow up visit.  Positive risk factors for bleeding include an age of 75 years or older.  The bleeding index is 'intermediate risk'.  Positive CHADS2 values include History of CHF and Age > 53 years old.  The start date was 11/19/2006.  Anticoagulation responsible provider: Andee Lineman MD, Michelle Piper.  INR POC: 1.5.  Cuvette Lot#: [id number].  Exp: 10/11.    Anticoagulation Management Assessment/Plan:      The patient's current anticoagulation dose is Warfarin sodium 5 mg tabs: Take by mouth as directed.  The target INR is 2.0-3.0.  The next INR is due 12/30/2009.  Anticoagulation instructions were given to patient.  Results were reviewed/authorized by Vashti Hey, RN.  She was notified by Vashti Hey RN.         Prior Anticoagulation Instructions: INR 1.6 Take coumadin 7.5mg  tonight then increase dose to 5mg  once daily except 2.5mg  on Mondays and Fridays  Current Anticoagulation Instructions: INR 1.5 Take coumadin 7.5mg  x 2 days then increase dose to 5mg  once daily and recheck 12/30/09

## 2011-01-19 NOTE — Medication Information (Signed)
Summary: ccr-lr  Anticoagulant Therapy  Managed by: Vashti Hey, RN Referring MD: Lewayne Bunting PCP: Laural Roes MD: Shirlee Latch MD, Dalton Indication 1: Atrial Fibrillation (ICD-427.31) [facility] [place] Site: [name] INR POC 1.9  Dietary changes: no    Health status changes: no    Bleeding/hemorrhagic complications: no    Recent/future hospitalizations: no    Any changes in medication regimen? no    Recent/future dental: no  Any missed doses?: no       Is patient compliant with meds? yes       Allergies: 1)  Sulfa 2)  Asa  Anticoagulation Management History:      The patient is taking warfarin and comes in today for a routine follow up visit.  Positive risk factors for bleeding include an age of 75 years or older.  The bleeding index is 'intermediate risk'.  Positive CHADS2 values include History of CHF and Age > 80 years old.  The start date was 11/19/2006.  Anticoagulation responsible provider: Shirlee Latch MD, [place].  INR POC: 1.9.  Cuvette Lot#: [id number].  Exp: 10/11.    Anticoagulation Management Assessment/Plan:      The patient's current anticoagulation dose is Warfarin sodium 5 mg tabs: Take by mouth as directed.  The target INR is 2.0-3.0.  The next INR is due 12/18/2010.  Anticoagulation instructions were given to patient.  Results were reviewed/authorized by Vashti Hey, RN.  She was notified by Vashti Hey RN.         Prior Anticoagulation Instructions: INR 1.9 Take coumadin 2 tablets tonight then resume 1 tablet once daily except 1 1/2 tablets on Tuesdays and Fridays  Current Anticoagulation Instructions: INR 1.9 Take coumadin 2 tablets tonight then increase dose to 1 tablet once daily except 1 1/2 tablets on T,Th,Sat

## 2011-01-19 NOTE — Medication Information (Signed)
Summary: rov/sp  Anticoagulant Therapy  Managed by: Vashti Hey, RN Referring MD: Lewayne Bunting PCP: Laural Roes MD: Myrtis Ser MD, Tinnie Gens Indication 1: Atrial Fibrillation (ICD-427.31) Lab [facility] [place] Site: [name] INR POC 2.0  Dietary changes: no    Health status changes: no    Bleeding/hemorrhagic complications: no    Recent/future hospitalizations: no    Any changes in medication regimen? no    Recent/future dental: no  Any missed doses?: no       Is patient compliant with meds? yes       Allergies: 1)  Sulfa 2)  Asa  Anticoagulation Management History:      The patient is taking warfarin and comes in today for a routine follow up visit.  Positive risk factors for bleeding include an age of 75 years or older.  The bleeding index is 'intermediate risk'.  Positive CHADS2 values include History of CHF and Age > 69 years old.  The start date was 11/19/2006.  Anticoagulation responsible provider: Myrtis Ser MD, Tinnie Gens.  INR POC: 2.0.  Cuvette Lot#: [id number].  Exp: 10/11.    Anticoagulation Management Assessment/Plan:      The patient's current anticoagulation dose is Warfarin sodium 5 mg tabs: Take by mouth as directed.  The target INR is 2.0-3.0.  The next INR is due 08/04/2010.  Anticoagulation instructions were given to patient.  Results were reviewed/authorized by Vashti Hey, RN.  She was notified by Vashti Hey RN.         Prior Anticoagulation Instructions: INR 1.8  Increase dose to 1 tablet every day.  Recheck in 3 weeks.   Current Anticoagulation Instructions: INR 2.0 Continue coumadin 5mg  once daily

## 2011-01-19 NOTE — Letter (Signed)
Summary: Appointment- Rescheduled  [facility] [address]   [place], [place] [address]   Phone: [phone]  Fax: [phone]     May 20, 2010 MRN: [id number]     [name] [address] [place], [place]  [address]     Dear [name],   Due to a change in our office schedule, your appointment on June 09, 2010 at 8:30 a.m. must be changed.  Your new appointment is June 12, 2010 at 8:30 a.m.   We look forward to participating in your health care needs.        Sincerely,  Glass blower/designer

## 2011-01-19 NOTE — Medication Information (Signed)
Summary: ccr-lr  Anticoagulant Therapy  Managed by: Vashti Hey, RN Referring MD: Lewayne Bunting PCP: Laural Roes MD: Myrtis Ser MD, Tinnie Gens Indication 1: Atrial Fibrillation (ICD-427.31) Lab [facility] [place] Site: [name] INR POC 2.0  Dietary changes: no    Health status changes: no    Bleeding/hemorrhagic complications: no    Recent/future hospitalizations: no    Any changes in medication regimen? no    Recent/future dental: no  Any missed doses?: no       Is patient compliant with meds? yes       Allergies: 1)  Sulfa 2)  Asa  Anticoagulation Management History:      The patient is taking warfarin and comes in today for a routine follow up visit.  Positive risk factors for bleeding include an age of 75 years or older.  The bleeding index is 'intermediate risk'.  Positive CHADS2 values include History of CHF and Age > 45 years old.  The start date was 11/19/2006.  Anticoagulation responsible provider: Myrtis Ser MD, Tinnie Gens.  INR POC: 2.0.  Cuvette Lot#: [id number].  Exp: 10/11.    Anticoagulation Management Assessment/Plan:      The patient's current anticoagulation dose is Warfarin sodium 5 mg tabs: Take by mouth as directed.  The target INR is 2.0-3.0.  The next INR is due 10/20/2010.  Anticoagulation instructions were given to patient.  Results were reviewed/authorized by Vashti Hey, RN.  She was notified by Vashti Hey RN.         Prior Anticoagulation Instructions: INR 2.7 Continue coumadin 5mg  once daily except 7.5mg  on Tuesdays and Fridays  Current Anticoagulation Instructions: INR 2.0 Continue coumadin 5mg  once daily except 7.5mg  on Tuesdays and Fridays

## 2011-01-19 NOTE — Assessment & Plan Note (Signed)
Summary: 6 MONTH FU   Visit Type:  Follow-up Primary Provider:  Hasanaj  CC:  follow-up visit.  History of Present Illness: the patient is an 77 year old female with a history of rheumatic heart disease, status post mitral valve replacement and Cox Maze procedure for atrial fibrillation. The patient has remained in normal sinus rhythm on amiodarone. She denies any chest pain, palpitations orthopnea or PND. She does state that she gets short of breath  or minimal exertion. She feels very depressed. She has frequent crying spells. She also significant insomnia. She has lost a son in [place] and another son in [place].   She denies any syncopal or syncopal spells. She reports that is becoming increasingly difficult for her to go to her doctor's visits due to limited financial means. She stated she is unable to drive to [facility] to see her cardiac surgeon in followup. She also states she will have difficulty coming to [facility].I told herr her that maybe we could consider a house call in the future. I also suggested to her to get a primary care physician in [place]. She does not want to see her doctor in [place] anymore.    Preventive Screening-Counseling & Management  Alcohol-Tobacco     Smoking Status: quit     Year Quit: 1990  Current Medications (verified): 1)  Warfarin Sodium 5 Mg Tabs (Warfarin Sodium) .... Take By Mouth As Directed 2)  Metoprolol Tartrate 25 Mg Tabs (Metoprolol Tartrate) .... Take 1/2 Tablet By Mouth Two Times A Day 3)  Synthroid 100 Mcg Tabs (Levothyroxine Sodium) .... Take 1 Tablet By Mouth Once A Day 4)  Fish Oil 1000 Mg Caps (Omega-3 Fatty Acids) .... Take 1 Tablet By Mouth Once A Day 5)  Lasix 40 Mg Tabs (Furosemide) .... Take 1 Tablet By Mouth Once A Day 6)  Humulin N 100 Unit/ml Susp (Insulin Isophane Human) .... Use As Directed 7)  Klor-Con 10 10 Meq Cr-Tabs (Potassium Chloride) .... Take 1 Tablet By Mouth Once A Day 8)  Ativan 0.5 Mg Tabs (Lorazepam) .... Take  1 Tablet By Mouth Two Times A Day 9)  Vitamin D 400 Unit Caps (Cholecalciferol) .... Take 2 Tablet By Mouth Once A Day 10)  Ranitidine Hcl 300 Mg Caps (Ranitidine Hcl) .... Take 1 Tablet By Mouth Two Times A Day 11)  Citalopram Hydrobromide 20 Mg Tabs (Citalopram Hydrobromide) .... Take 1 Tablet By Mouth Every Morning  Allergies (verified): 1)  Sulfa 2)  Asa  Comments:  Nurse/Medical Assistant: The patient's medications and allergies were reviewed with the patient and were updated in the Medication and Allergy Lists. List reviewed.  Past History:  Past Medical History: Last updated: 11/10/2009 1. Rheumatic heart disease status post right thoracotomy and mitral     valve replacement with a Medtronic Mosaic porcine tissue valve. 2. Status post Cox CryoMaze procedure with closure of patent foramen     ovale. 3. History of congestive heart failure. 4. Spinal stenosis with severe degenerative disk disease of the     lumbosacral spine. 5. Hypothyroidism. 6. Chronic obstructive pulmonary disease. 7. Gastroesophageal reflux disease. 8. Status post balloon mitral valvuloplasty in 1993. 9. Sinus tachycardia.  Past Surgical History: Last updated: 08/20/2009 Exploratory laparotomy Left foot surgery Balloon valvuloplasty, mitral valve 1993  Family History: Last updated: 11/10/2009 Negative FH of Diabetes, Hypertension, or Coronary Artery Disease  Social History: Last updated: 08/20/2009 Widowed  Tobacco Use - Former.  Alcohol Use - no  Risk Factors: Smoking Status: quit (04/20/2010)  Review of Systems       The patient complains of fatigue and depression.  The patient denies malaise, fever, weight gain/loss, vision loss, decreased hearing, hoarseness, chest pain, palpitations, shortness of breath, prolonged cough, wheezing, sleep apnea, coughing up blood, abdominal pain, blood in stool, nausea, vomiting, diarrhea, heartburn, incontinence, blood in urine, muscle weakness,  joint pain, leg swelling, rash, skin lesions, headache, fainting, dizziness, anxiety, enlarged lymph nodes, easy bruising or bleeding, and environmental allergies.    Vital Signs:  Patient profile:   77 year old female Height:      63 inches Weight:      165 pounds Pulse rate:   64 / minute BP sitting:   135 / 73  (left arm) Cuff size:   regular  Vitals Entered By: Carlye Grippe (Apr 20, 2010 8:56 AM) CC: follow-up visit   Physical Exam  Additional Exam:  General: Well-developed, well-nourished in no distress head: Normocephalic and atraumatic eyes PERRLA/EOMI intact, conjunctiva and lids normal nose: No deformity or lesions mouth normal dentition, normal posterior pharynx neck: Supple, no JVD.  No masses, thyromegaly or abnormal cervical nodes lungs: Normal breath sounds bilaterally without wheezing.  Normal percussion heart: regular rate and rhythm with normal S1 and S2, no S3 or S4.  PMI is normal.  No pathological murmurs abdomen: Normal bowel sounds, abdomen is soft and nontender without masses, organomegaly or hernias noted.  No hepatosplenomegaly musculoskeletal: Back normal, normal gait muscle strength and tone normal pulsus: Pulse is normal in all 4 extremities Extremities: No peripheral pitting edema neurologic: Alert and oriented x 3 skin: Intact without lesions or rashes cervical nodes: No significant adenopathy psychologic: Normal affect    Impression & Recommendations:  Problem # 1:  ATRIAL FIBRILLATION, PAROXYSMAL, HX OF (ICD-V12.50) the patient is status post Maze procedure. We will discontinue amiodarone. Orders: EKG w/ Interpretation (93000)  Problem # 2:  COUMADIN THERAPY (ICD-V58.61) continue Coumadin therapy.  Problem # 3:  SPINAL STENOSIS (ICD-724.00) Assessment: Comment Only  Problem # 4:  DEPRESSION, SITUATIONAL, ACUTE (ICD-300.4) the patient has significant depression. She has gone to hard times having lost 2 sons. Financially also she is  having a hard time. The patient will be started oncitalopram 20 mg p.o. q. daily  Patient Instructions: 1)  Stop Amiodarone 2)  Citalopram 20mg  daily 3)  Follow up in  as needed  Prescriptions: METOPROLOL TARTRATE 25 MG TABS (METOPROLOL TARTRATE) Take 1/2 tablet by mouth two times a day  #90 x 3   Entered by:   Hoover Brunette, LPN   Authorized by:   Lewayne Bunting, MD, [facility]   Signed by:   Hoover Brunette, LPN on 16/09/9603   Method used:   Electronically to        [facility] (retail)       [address]       [place], [place]  [address]       Ph: [phone]       Fax: [phone]   RxID:   [id number] CITALOPRAM HYDROBROMIDE 20 MG TABS (CITALOPRAM HYDROBROMIDE) Take 1 tablet by mouth every morning  #30 x 3   Entered by:   Hoover Brunette, LPN   Authorized by:   Lewayne Bunting, MD, [facility]   Signed by:   Hoover Brunette, LPN on 10/16/2535   Method used:   Electronically to        [facility] (retail)       [address]  [place], [place]  [address]       Ph: [phone]       Fax: [phone]   RxID:   [id number]   Handout requested.

## 2011-01-19 NOTE — Medication Information (Signed)
Summary: ccr-srs  Anticoagulant Therapy  Managed by: Vashti Hey, RN Referring MD: Lewayne Bunting PCP: Laural Roes MD: Diona Browner MD, Remi Deter Indication 1: Atrial Fibrillation (ICD-427.31) [name] Site: [name] INR POC 2.9  Dietary changes: no    Health status changes: no    Bleeding/hemorrhagic complications: no    Recent/future hospitalizations: no    Any changes in medication regimen? yes       Details: Taking tylenol on flu  Recent/future dental: no  Any missed doses?: no       Is patient compliant with meds? yes       Allergies: 1)  Sulfa 2)  Asa  Anticoagulation Management History:      The patient is taking warfarin and comes in today for a routine follow up visit.  Positive risk factors for bleeding include an age of 12 years or older.  The bleeding index is 'intermediate risk'.  Positive CHADS2 values include History of CHF and Age > 18 years old.  The start date was 11/19/2006.  Anticoagulation responsible provider: Diona Browner MD, Remi Deter.  INR POC: 2.9.  Cuvette Lot#: [id number].  Exp: 10/11.    Anticoagulation Management Assessment/Plan:      The patient's current anticoagulation dose is Warfarin sodium 5 mg tabs: Take by mouth as directed.  The target INR is 2.0-3.0.  The next INR is due 01/23/2010.  Anticoagulation instructions were given to patient.  Results were reviewed/authorized by Vashti Hey, RN.  She was notified by Vashti Hey RN.         Prior Anticoagulation Instructions: INR 1.5 Take coumadin 7.5mg  x 2 days then increase dose to 5mg  once daily and recheck 12/30/09  Current Anticoagulation Instructions: INR 2.9 Continue coumadin 5mg  once daily

## 2011-01-19 NOTE — Medication Information (Signed)
Summary: ccr-lr  Anticoagulant Therapy  Managed by: Vashti Hey, RN Referring MD: Lewayne Bunting PCP: Laural Roes MD: Andee Lineman MD, Michelle Piper Indication 1: Atrial Fibrillation (ICD-427.31) [facility] Site: [name] INR POC 1.9  Dietary changes: no    Health status changes: no    Bleeding/hemorrhagic complications: no    Recent/future hospitalizations: no    Any changes in medication regimen? no    Recent/future dental: no  Any missed doses?: no       Is patient compliant with meds? yes       Allergies: 1)  Sulfa 2)  Asa  Anticoagulation Management History:      The patient is taking warfarin and comes in today for a routine follow up visit.  Positive risk factors for bleeding include an age of 75 years or older.  The bleeding index is 'intermediate risk'.  Positive CHADS2 values include History of CHF and Age > 75 years old.  The start date was 11/19/2006.  Anticoagulation responsible provider: Andee Lineman MD, Michelle Piper.  INR POC: 1.9.  Cuvette Lot#: [id number].  Exp: 10/11.    Anticoagulation Management Assessment/Plan:      The patient's current anticoagulation dose is Warfarin sodium 5 mg tabs: Take by mouth as directed.  The target INR is 2.0-3.0.  The next INR is due 11/17/2010.  Anticoagulation instructions were given to patient.  Results were reviewed/authorized by Vashti Hey, RN.  She was notified by Vashti Hey RN.         Prior Anticoagulation Instructions: INR 2.0 Continue coumadin 5mg  once daily except 7.5mg  on Tuesdays and Fridays  Current Anticoagulation Instructions: INR 1.9 Take coumadin 2 tablets tonight then resume 1 tablet once daily except 1 1/2 tablets on Tuesdays and Fridays

## 2011-01-21 NOTE — Medication Information (Signed)
Summary: ccr-lr  Anticoagulant Therapy  Managed by: Vashti Hey, RN Referring MD: Lewayne Bunting PCP: Laural Roes MD: Diona Browner MD, Remi Deter Indication 1: Atrial Fibrillation (ICD-427.31) Lab [facility] Site: [name] INR POC 1.8  Dietary changes: no    Health status changes: no    Bleeding/hemorrhagic complications: no    Recent/future hospitalizations: no    Any changes in medication regimen? no    Recent/future dental: no  Any missed doses?: no       Is patient compliant with meds? yes       Allergies: 1)  Sulfa 2)  Asa  Anticoagulation Management History:      The patient is taking warfarin and comes in today for a routine follow up visit.  Positive risk factors for bleeding include an age of 23 years or older.  The bleeding index is 'intermediate risk'.  Positive CHADS2 values include History of CHF and Age > 81 years old.  The start date was 11/19/2006.  Anticoagulation responsible Rodrigus Kilker: Diona Browner MD, Remi Deter.  INR POC: 1.8.  Cuvette Lot#: [id number].  Exp: 10/11.    Anticoagulation Management Assessment/Plan:      The patient's current anticoagulation dose is Warfarin sodium 5 mg tabs: Take by mouth as directed.  The target INR is 2.0-3.0.  The next INR is due 01/12/2011.  Anticoagulation instructions were given to patient.  Results were reviewed/authorized by Vashti Hey, RN.  She was notified by Vashti Hey RN.         Prior Anticoagulation Instructions: INR 1.9 Take coumadin 2 tablets tonight then increase dose to 1 tablet once daily except 1 1/2 tablets on T,Th,Sat  Current Anticoagulation Instructions: INR 1.8 Take coumadin 2 tablets tonight then increase dose to 7.5mg  once daily except 5mg  on Mondays and Thursdays

## 2011-01-21 NOTE — Medication Information (Signed)
Summary: ccr-lr  Anticoagulant Therapy  Managed by: Vashti Hey, RN Referring MD: Lewayne Bunting PCP: Laural Roes MD: Diona Browner MD, Remi Deter Indication 1: Atrial Fibrillation (ICD-427.31) Lab [facility] Site: [name] INR POC 3.5  Dietary changes: no    Health status changes: no    Bleeding/hemorrhagic complications: no    Recent/future hospitalizations: no    Any changes in medication regimen? no    Recent/future dental: no  Any missed doses?: no       Is patient compliant with meds? yes       Allergies: 1)  Sulfa 2)  Asa  Anticoagulation Management History:      The patient is taking warfarin and comes in today for a routine follow up visit.  Positive risk factors for bleeding include an age of 75 years or older.  The bleeding index is 'intermediate risk'.  Positive CHADS2 values include History of CHF and Age > 19 years old.  The start date was 11/19/2006.  Anticoagulation responsible provider: Diona Browner MD, Remi Deter.  INR POC: 3.5.  Cuvette Lot#: [id number].  Exp: 10/11.    Anticoagulation Management Assessment/Plan:      The patient's current anticoagulation dose is Warfarin sodium 5 mg tabs: Take by mouth as directed.  The target INR is 2.0-3.0.  The next INR is due 02/09/2011.  Anticoagulation instructions were given to patient.  Results were reviewed/authorized by Vashti Hey, RN.  She was notified by Vashti Hey RN.         Prior Anticoagulation Instructions: INR 1.8 Take coumadin 2 tablets tonight then increase dose to 7.5mg  once daily except 5mg  on Mondays and Thursdays  Current Anticoagulation Instructions: INR 3.5 Hold coumadin tonight then decrease dose to 7.5mg  once daily except 5mg  on M,W,F

## 2011-02-09 ENCOUNTER — Encounter: Payer: Self-pay | Admitting: Cardiology

## 2011-02-09 ENCOUNTER — Encounter (INDEPENDENT_AMBULATORY_CARE_PROVIDER_SITE_OTHER): Payer: MEDICARE

## 2011-02-09 DIAGNOSIS — I4891 Unspecified atrial fibrillation: Secondary | ICD-10-CM

## 2011-02-09 LAB — CONVERTED CEMR LAB: POC INR: 4.2

## 2011-02-16 NOTE — Medication Information (Signed)
Summary: [facility]  Anticoagulant Therapy  Managed by: Vashti Hey, RN Referring MD: Lewayne Bunting PCP: Laural Roes MD: Diona Browner MD, Remi Deter Indication 1: Atrial Fibrillation (ICD-427.31) Lab [facility] Site: [name] INR POC 4.2  Dietary changes: no    Health status changes: no    Bleeding/hemorrhagic complications: no    Recent/future hospitalizations: no    Any changes in medication regimen? no    Recent/future dental: no  Any missed doses?: no       Is patient compliant with meds? yes       Allergies: 1)  Sulfa 2)  Asa  Anticoagulation Management History:      The patient is taking warfarin and comes in today for a routine follow up visit.  Positive risk factors for bleeding include an age of 45 years or older.  The bleeding index is 'intermediate risk'.  Positive CHADS2 values include History of CHF and Age > 76 years old.  The start date was 11/19/2006.  Anticoagulation responsible provider: Diona Browner MD, Remi Deter.  INR POC: 4.2.  Cuvette Lot#: [id number].  Exp: 10/11.    Anticoagulation Management Assessment/Plan:      The patient's current anticoagulation dose is Warfarin sodium 5 mg tabs: Take by mouth as directed.  The target INR is 2.0-3.0.  The next INR is due 02/23/2011.  Anticoagulation instructions were given to patient.  Results were reviewed/authorized by Vashti Hey, RN.  She was notified by Vashti Hey RN.         Prior Anticoagulation Instructions: INR 3.5 Hold coumadin tonight then decrease dose to 7.5mg  once daily except 5mg  on M,W,F  Current Anticoagulation Instructions: INR 4.2 Hold coumadin tonight then decrease dose to 5mg  once daily except 7.5mg  on Tuesdays Pt wants to start having coumadin managed at Dr Bartholomew Crews office due to co-pay Arranged with Dr Bartholomew Crews office    Next INR 02/23/11

## 2011-02-16 NOTE — Medication Information (Signed)
Summary: [facility]  Anticoagulant Therapy  Managed by: Inactive Referring MD: Lewayne Bunting PCP: Laural Roes MD: Diona Browner MD, Remi Deter Indication 1: Atrial Fibrillation (ICD-427.31) Lab [facility] Site: [name]          Comments: INR will start being managed by Dr Bartholomew Crews office due to co-pay.  Allergies: 1)  Sulfa 2)  Asa  Anticoagulation Management History:      Positive risk factors for bleeding include an age of 73 years or older.  The bleeding index is 'intermediate risk'.  Positive CHADS2 values include History of CHF and Age > 44 years old.  The start date was 11/19/2006.  Anticoagulation responsible provider: Diona Browner MD, Remi Deter.  Exp: 10/11.    Anticoagulation Management Assessment/Plan:      The patient's current anticoagulation dose is Warfarin sodium 5 mg tabs: Take by mouth as directed.  The target INR is 2.0-3.0.  The next INR is due 02/23/2011.  Anticoagulation instructions were given to patient.  Results were reviewed/authorized by Inactive.         Prior Anticoagulation Instructions: INR 4.2 Hold coumadin tonight then decrease dose to 5mg  once daily except 7.5mg  on Tuesdays Pt wants to start having coumadin managed at Dr Bartholomew Crews office due to co-pay Arranged with Dr Bartholomew Crews office    Next INR 02/23/11

## 2011-03-24 ENCOUNTER — Other Ambulatory Visit: Payer: Self-pay | Admitting: *Deleted

## 2011-03-24 MED ORDER — WARFARIN SODIUM 5 MG PO TABS: 5.0000 mg | ORAL_TABLET | ORAL | Status: DC

## 2011-03-29 LAB — GLUCOSE, CAPILLARY
Glucose-Capillary: 111 mg/dL — ABNORMAL HIGH (ref 70–99)
Glucose-Capillary: 116 mg/dL — ABNORMAL HIGH (ref 70–99)
Glucose-Capillary: 118 mg/dL — ABNORMAL HIGH (ref 70–99)
Glucose-Capillary: 119 mg/dL — ABNORMAL HIGH (ref 70–99)
Glucose-Capillary: 119 mg/dL — ABNORMAL HIGH (ref 70–99)
Glucose-Capillary: 121 mg/dL — ABNORMAL HIGH (ref 70–99)
Glucose-Capillary: 121 mg/dL — ABNORMAL HIGH (ref 70–99)
Glucose-Capillary: 126 mg/dL — ABNORMAL HIGH (ref 70–99)
Glucose-Capillary: 136 mg/dL — ABNORMAL HIGH (ref 70–99)
Glucose-Capillary: 137 mg/dL — ABNORMAL HIGH (ref 70–99)
Glucose-Capillary: 140 mg/dL — ABNORMAL HIGH (ref 70–99)
Glucose-Capillary: 144 mg/dL — ABNORMAL HIGH (ref 70–99)
Glucose-Capillary: 152 mg/dL — ABNORMAL HIGH (ref 70–99)
Glucose-Capillary: 156 mg/dL — ABNORMAL HIGH (ref 70–99)
Glucose-Capillary: 158 mg/dL — ABNORMAL HIGH (ref 70–99)
Glucose-Capillary: 164 mg/dL — ABNORMAL HIGH (ref 70–99)
Glucose-Capillary: 166 mg/dL — ABNORMAL HIGH (ref 70–99)
Glucose-Capillary: 174 mg/dL — ABNORMAL HIGH (ref 70–99)
Glucose-Capillary: 189 mg/dL — ABNORMAL HIGH (ref 70–99)
Glucose-Capillary: 87 mg/dL (ref 70–99)
Glucose-Capillary: 89 mg/dL (ref 70–99)

## 2011-03-29 LAB — TYPE AND SCREEN
ABO/RH(D): O POS
ABO/RH(D): O POS
Antibody Screen: NEGATIVE

## 2011-03-29 LAB — PREPARE FRESH FROZEN PLASMA

## 2011-03-29 LAB — CBC
HCT: 29 % — ABNORMAL LOW (ref 36.0–46.0)
HCT: 29.4 % — ABNORMAL LOW (ref 36.0–46.0)
HCT: 31.7 % — ABNORMAL LOW (ref 36.0–46.0)
HCT: 32.8 % — ABNORMAL LOW (ref 36.0–46.0)
Hemoglobin: 10.4 g/dL — ABNORMAL LOW (ref 12.0–15.0)
Hemoglobin: 10.8 g/dL — ABNORMAL LOW (ref 12.0–15.0)
Hemoglobin: 11.1 g/dL — ABNORMAL LOW (ref 12.0–15.0)
Hemoglobin: 9.9 g/dL — ABNORMAL LOW (ref 12.0–15.0)
Hemoglobin: 9.9 g/dL — ABNORMAL LOW (ref 12.0–15.0)
MCHC: 33.8 g/dL (ref 30.0–36.0)
MCHC: 34 g/dL (ref 30.0–36.0)
MCHC: 34 g/dL (ref 30.0–36.0)
MCHC: 34.1 g/dL (ref 30.0–36.0)
MCHC: 34.4 g/dL (ref 30.0–36.0)
MCHC: 34.7 g/dL (ref 30.0–36.0)
MCV: 93.4 fL (ref 78.0–100.0)
MCV: 93.5 fL (ref 78.0–100.0)
MCV: 95.1 fL (ref 78.0–100.0)
MCV: 95.3 fL (ref 78.0–100.0)
MCV: 95.7 fL (ref 78.0–100.0)
MCV: 97.4 fL (ref 78.0–100.0)
Platelets: 128 10*3/uL — ABNORMAL LOW (ref 150–400)
Platelets: 128 10*3/uL — ABNORMAL LOW (ref 150–400)
Platelets: 158 10*3/uL (ref 150–400)
Platelets: 159 10*3/uL (ref 150–400)
Platelets: 89 10*3/uL — ABNORMAL LOW (ref 150–400)
Platelets: 92 10*3/uL — ABNORMAL LOW (ref 150–400)
RBC: 3.04 MIL/uL — ABNORMAL LOW (ref 3.87–5.11)
RBC: 3.09 MIL/uL — ABNORMAL LOW (ref 3.87–5.11)
RBC: 3.21 MIL/uL — ABNORMAL LOW (ref 3.87–5.11)
RBC: 3.33 MIL/uL — ABNORMAL LOW (ref 3.87–5.11)
RBC: 3.46 MIL/uL — ABNORMAL LOW (ref 3.87–5.11)
RBC: 3.51 MIL/uL — ABNORMAL LOW (ref 3.87–5.11)
RDW: 14.4 % (ref 11.5–15.5)
RDW: 14.9 % (ref 11.5–15.5)
RDW: 15.6 % — ABNORMAL HIGH (ref 11.5–15.5)
RDW: 16.1 % — ABNORMAL HIGH (ref 11.5–15.5)
RDW: 16.5 % — ABNORMAL HIGH (ref 11.5–15.5)
WBC: 10.2 10*3/uL (ref 4.0–10.5)
WBC: 6.8 10*3/uL (ref 4.0–10.5)
WBC: 6.8 10*3/uL (ref 4.0–10.5)
WBC: 7.7 10*3/uL (ref 4.0–10.5)
WBC: 9.1 10*3/uL (ref 4.0–10.5)

## 2011-03-29 LAB — BASIC METABOLIC PANEL
BUN: 14 mg/dL (ref 6–23)
BUN: 18 mg/dL (ref 6–23)
CO2: 24 mEq/L (ref 19–32)
CO2: 30 mEq/L (ref 19–32)
CO2: 32 mEq/L (ref 19–32)
CO2: 33 mEq/L — ABNORMAL HIGH (ref 19–32)
Calcium: 8.3 mg/dL — ABNORMAL LOW (ref 8.4–10.5)
Calcium: 8.5 mg/dL (ref 8.4–10.5)
Calcium: 8.8 mg/dL (ref 8.4–10.5)
Chloride: 101 mEq/L (ref 96–112)
Chloride: 105 mEq/L (ref 96–112)
Chloride: 113 mEq/L — ABNORMAL HIGH (ref 96–112)
Chloride: 99 mEq/L (ref 96–112)
Creatinine, Ser: 0.78 mg/dL (ref 0.4–1.2)
Creatinine, Ser: 0.87 mg/dL (ref 0.4–1.2)
Creatinine, Ser: 0.91 mg/dL (ref 0.4–1.2)
Creatinine, Ser: 0.99 mg/dL (ref 0.4–1.2)
GFR calc Af Amer: >60 mL/min (ref >60)
GFR calc Af Amer: >60 mL/min (ref >60)
GFR calc Af Amer: >60 mL/min (ref >60)
GFR calc Af Amer: >60 mL/min (ref >60)
GFR calc non Af Amer: 54 mL/min — ABNORMAL LOW (ref >60)
GFR calc non Af Amer: 59 mL/min — ABNORMAL LOW (ref >60)
GFR calc non Af Amer: >60 mL/min (ref >60)
Glucose, Bld: 141 mg/dL — ABNORMAL HIGH (ref 70–99)
Glucose, Bld: 142 mg/dL — ABNORMAL HIGH (ref 70–99)
Glucose, Bld: 180 mg/dL — ABNORMAL HIGH (ref 70–99)
Potassium: 3.7 mEq/L (ref 3.5–5.1)
Potassium: 3.8 mEq/L (ref 3.5–5.1)
Potassium: 4.4 mEq/L (ref 3.5–5.1)
Sodium: 137 mEq/L (ref 135–145)
Sodium: 138 mEq/L (ref 135–145)
Sodium: 139 mEq/L (ref 135–145)
Sodium: 141 mEq/L (ref 135–145)
Sodium: 142 mEq/L (ref 135–145)

## 2011-03-29 LAB — PROTIME-INR
INR: 1 (ref 0.00–1.49)
INR: 1.2 (ref 0.00–1.49)
INR: 1.3 (ref 0.00–1.49)
INR: 1.3 (ref 0.00–1.49)
INR: 1.3 (ref 0.00–1.49)
INR: 1.5 (ref 0.00–1.49)
INR: 1.5 (ref 0.00–1.49)
Prothrombin Time: 16 seconds — ABNORMAL HIGH (ref 11.6–15.2)
Prothrombin Time: 16.8 seconds — ABNORMAL HIGH (ref 11.6–15.2)
Prothrombin Time: 16.8 seconds — ABNORMAL HIGH (ref 11.6–15.2)
Prothrombin Time: 18.6 seconds — ABNORMAL HIGH (ref 11.6–15.2)

## 2011-03-29 LAB — COMPREHENSIVE METABOLIC PANEL
ALT: 28 U/L (ref 0–35)
ALT: 36 U/L — ABNORMAL HIGH (ref 0–35)
AST: 30 U/L (ref 0–37)
AST: 31 U/L (ref 0–37)
Albumin: 2.7 g/dL — ABNORMAL LOW (ref 3.5–5.2)
Alkaline Phosphatase: 52 U/L (ref 39–117)
Alkaline Phosphatase: 64 U/L (ref 39–117)
BUN: 16 mg/dL (ref 6–23)
CO2: 22 mEq/L (ref 19–32)
CO2: 34 mEq/L — ABNORMAL HIGH (ref 19–32)
Calcium: 7.9 mg/dL — ABNORMAL LOW (ref 8.4–10.5)
Calcium: 9.1 mg/dL (ref 8.4–10.5)
Chloride: 110 mEq/L (ref 96–112)
Chloride: 98 mEq/L (ref 96–112)
Creatinine, Ser: 0.87 mg/dL (ref 0.4–1.2)
GFR calc Af Amer: >60 mL/min (ref >60)
GFR calc Af Amer: >60 mL/min (ref >60)
GFR calc non Af Amer: 56 mL/min — ABNORMAL LOW (ref >60)
GFR calc non Af Amer: >60 mL/min (ref >60)
Glucose, Bld: 119 mg/dL — ABNORMAL HIGH (ref 70–99)
Glucose, Bld: 87 mg/dL (ref 70–99)
Potassium: 3.4 mEq/L — ABNORMAL LOW (ref 3.5–5.1)
Sodium: 137 mEq/L (ref 135–145)
Sodium: 140 mEq/L (ref 135–145)
Total Bilirubin: 0.5 mg/dL (ref 0.3–1.2)
Total Bilirubin: 1.1 mg/dL (ref 0.3–1.2)
Total Protein: 5.3 g/dL — ABNORMAL LOW (ref 6.0–8.3)

## 2011-03-29 LAB — URINALYSIS, ROUTINE W REFLEX MICROSCOPIC
Bilirubin Urine: NEGATIVE
Glucose, UA: NEGATIVE mg/dL
Hgb urine dipstick: NEGATIVE
Specific Gravity, Urine: 1.016 (ref 1.005–1.030)
Urobilinogen, UA: 1 mg/dL (ref 0.0–1.0)
pH: 7 (ref 5.0–8.0)

## 2011-03-29 LAB — POCT I-STAT, CHEM 8
BUN: 15 mg/dL (ref 6–23)
Chloride: 103 mEq/L (ref 96–112)
Creatinine, Ser: 1 mg/dL (ref 0.4–1.2)
Potassium: 3.7 mEq/L (ref 3.5–5.1)
Sodium: 141 mEq/L (ref 135–145)

## 2011-03-29 LAB — POCT I-STAT 4, (NA,K, GLUC, HGB,HCT)
Glucose, Bld: 122 mg/dL — ABNORMAL HIGH (ref 70–99)
Hemoglobin: 9.5 g/dL — ABNORMAL LOW (ref 12.0–15.0)
Potassium: 3 mEq/L — ABNORMAL LOW (ref 3.5–5.1)

## 2011-03-29 LAB — POCT I-STAT 3, ART BLOOD GAS (G3+)
Bicarbonate: 19.7 mEq/L — ABNORMAL LOW (ref 20.0–24.0)
Bicarbonate: 21.7 mEq/L (ref 20.0–24.0)
O2 Saturation: 98 %
Patient temperature: 37.2
TCO2: 21 mmol/L (ref 0–100)
TCO2: 24 mmol/L (ref 0–100)
pCO2 arterial: 28.7 mmHg — ABNORMAL LOW (ref 35.0–45.0)
pCO2 arterial: 34 mmHg — ABNORMAL LOW (ref 35.0–45.0)
pH, Arterial: 7.372 (ref 7.350–7.400)
pH, Arterial: 7.375 (ref 7.350–7.400)
pO2, Arterial: 77 mmHg — ABNORMAL LOW (ref 80.0–100.0)

## 2011-03-29 LAB — URINE MICROSCOPIC-ADD ON

## 2011-03-29 LAB — PREPARE PLATELETS

## 2011-03-29 LAB — HEMOGLOBIN A1C: Hgb A1c MFr Bld: 5.9 % (ref 4.6–6.1)

## 2011-03-29 LAB — BLOOD GAS, ARTERIAL
Acid-Base Excess: 0.8 mmol/L (ref 0.0–2.0)
Drawn by: 313941
FIO2: 0.21 %
O2 Saturation: 97.6 %
pO2, Arterial: 88.9 mmHg (ref 80.0–100.0)

## 2011-03-29 LAB — MAGNESIUM: Magnesium: 2.1 mg/dL (ref 1.5–2.5)

## 2011-03-29 LAB — CREATININE, SERUM: GFR calc Af Amer: >60 mL/min (ref >60)

## 2011-03-29 LAB — APTT: aPTT: 35 seconds (ref 24–37)

## 2011-03-30 LAB — POCT I-STAT 3, VENOUS BLOOD GAS (G3P V)
Acid-base deficit: 2 mmol/L (ref 0.0–2.0)
Bicarbonate: 22.7 mEq/L (ref 20.0–24.0)
pCO2, Ven: 39.8 mmHg — ABNORMAL LOW (ref 45.0–50.0)
pH, Ven: 7.348 — ABNORMAL HIGH (ref 7.250–7.300)
pH, Ven: 7.372 — ABNORMAL HIGH (ref 7.250–7.300)
pO2, Ven: 34 mmHg (ref 30.0–45.0)

## 2011-03-30 LAB — POCT I-STAT GLUCOSE
Glucose, Bld: 100 mg/dL — ABNORMAL HIGH (ref 70–99)
Operator id: 221371

## 2011-03-30 LAB — POCT I-STAT 3, ART BLOOD GAS (G3+)
O2 Saturation: 93 %
TCO2: 22 mmol/L (ref 0–100)
pCO2 arterial: 35.7 mmHg (ref 35.0–45.0)
pH, Arterial: 7.383 (ref 7.350–7.400)
pO2, Arterial: 66 mmHg — ABNORMAL LOW (ref 80.0–100.0)

## 2011-04-13 ENCOUNTER — Other Ambulatory Visit: Payer: Self-pay | Admitting: *Deleted

## 2011-04-13 MED ORDER — METOPROLOL TARTRATE 25 MG PO TABS: ORAL_TABLET | ORAL | Status: DC

## 2011-05-04 NOTE — Op Note (Signed)
[name], [name]           ACCOUNT NO.:  [id number]   MEDICAL RECORD NO.:  [id number]          PATIENT TYPE:  INP   LOCATION:  2311                         FACILITY:  [facility]   PHYSICIAN:  Salvatore Decent. Cornelius Moras, M.D. DATE OF BIRTH:  Jan 19, 1925   DATE OF PROCEDURE:  05/23/2009  DATE OF DISCHARGE:                               OPERATIVE REPORT   PREOPERATIVE DIAGNOSES:  1. Severe mitral regurgitation.  2. Mild mitral stenosis.  3. Persistent atrial fibrillation, status post direct current      cardioversion.   POSTOPERATIVE DIAGNOSES:  1. Severe mitral regurgitation.  2. Mild mitral stenosis.  3. Persistent atrial fibrillation, status post direct current      cardioversion.   PROCEDURE:  Right thoracotomy for mitral valve replacement (27-mm  Medtronic Mosaic porcine tissue valve) and Cox CryoMaze procedure with  closure of patent foramen ovale.   SURGEON:  Salvatore Decent. Cornelius Moras, MD   ASSISTANT:  Doree Fudge, PA   ANESTHESIA:  General.   BRIEF CLINICAL NOTE:  The patient is an 77 year old female with  longstanding history of rheumatic heart disease with congestive heart  failure and atrial fibrillation.  The patient underwent balloon  valvuloplasty for mitral stenosis in 1993 at [facility].  She developed persistent atrial fibrillation for which she  underwent DC cardioversion on two previous occasions.  Recently, the  patient has presented with progressive symptoms of congestive heart  failure.  Transesophageal echocardiogram reveals mild-to-moderate mitral  stenosis with severe mitral regurgitation.  There is normal left  ventricular function.  Cardiac catheterization demonstrates no  significant coronary artery disease.  Full consultation has been  dictated previously.  The patient and her family have been counseled  regarding the indications, risks, and potential benefits of surgery.  Alternative treatment strategies have been discussed.  They  understand  and accept all potential associated risks and desire to proceed with  surgery as described.   OPERATIVE FINDINGS:  1. Normal left ventricular systolic function.  2. 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  3. Mild-to-moderate mitral stenosis.  4. Severe mitral regurgitation.  5. Small patent foramen ovale with left-to-right shunt.  6. Severe adhesions surrounding the surrounding the right lung      requiring enlarged thoracotomy incision for exposure.   OPERATIVE NOTE IN DETAIL:  The patient was brought to the operating room  on the above-mentioned date and central monitoring was established by  the anesthesia team under the care and direction of Dr. Kipp Brood.  Specifically, a Swan-Ganz catheter was placed through the left internal  jugular approach.  A radial arterial line was placed.  Intravenous  antibiotics were administered.  The patient was placed in the supine  position on the operating table.  General endotracheal anesthesia was  induced uneventfully.  The patient was initially intubated using a dual-  lumen endotracheal tube.  Baseline transesophageal echocardiogram was  performed by Dr. Noreene Larsson.  This confirms the findings of severe  restriction of the anterior and posterior leaflets of the mitral valve  during  both systole and diastole with thickening of the mitral valve  leaflets and foreshortening of the subvalvular apparatus, all consistent  with rheumatic mitral valve disease.  There was mild-to-moderate mitral  stenosis with severe mitral regurgitation.  The aortic valve was normal.  Left ventricular systolic function was normal.  There was a small patent  foramen ovale with left-to-right shunt.  No other abnormalities were  noted.  The patient at the following Foley catheter placement, the  patient was positioned with a soft roll behind the right scapula and the  neck gently  extended and turned towards the left.  The patient's right  neck, chest, abdomen, both groins, and both lower extremities were  prepared and draped in sterile manner.   A small incision was made in the right inguinal crease.  The anterior  surface of the right common femoral artery and right common femoral vein  were dissected through this incision.  Dissection was performed just to  adequately to expose the anterior surface of these vessels.  There were  some chronic scar in the anterior surface of the femoral artery related  to previous cardiac catheterization.  The artery was otherwise normal.   Right anterior thoracotomy incision was performed.  Single lung  ventilation was begun.  The right pleural space was entered through the  fourth intercostal space.  There were adhesions surrounding the lung  between the visceral and parietal pleura.  These adhesions were  painstakingly divided under direct vision using electrocautery and sharp  dissection.  Adhesions were severe enough that the thoracotomy incision  must be extended in both directions to facilitate adequate exposure.  Ultimately, the lung was adequately mobilized to proceed with surgery as  described.  Pledgeted stitch was placed in the dome of the right  hemidiaphragm and retracted inferiorly to facilitate exposure.  A  longitudinal incision was made in the pericardium 2 cm anterior to the  phrenic nerve.  The pericardium was somewhat thickened, but there were  no adhesions within the pericardial sac.   The patient was heparinized systemically.  Pursestring sutures were  placed on the anterior surface of the right common femoral vein.  Two  concentric small pursestring sutures were placed on the anterior surface  of the right common femoral artery.  The right common femoral vein was  cannulated with Seldinger technique and a long flexible guidewire was  advanced up through the femoral vein, through the inferior vena cava,   through the right atrium into the superior vena cava, using  transesophageal echocardiogram for guidance.  The femoral vein was then  dilated with serial dilators and a 22-French femoral venous cannula was  advanced over the guidewire until the tip of the cannula passes through  the right atrium into the superior vena cava.  The right common femoral  artery was now cannulated with a Seldinger technique.  The femoral  artery was dilated with serial dilators and a 16-French femoral arterial  cannula was advanced uneventfully into the femoral artery.  The small  stab incision was made low in the right neck.  The right internal  jugular vein was cannulated through this incision using the Seldinger  technique and a flexible guidewire was advanced into the right atrium.  The internal jugular vein was dilated with serial dilators and a 14-  [place] pediatric femoral venous cannula was advanced over the guidewire  into the superior vena cava.   Cardiopulmonary bypass was begun.  Vacuum-assisted venous drainage was  utilized.  Drainage  was felt to be excellent and exposure was excellent.  The pericardial incision was extended in both directions.  Silk sutures  were placed for pericardial traction, stitches and retracted in either  direction to facilitate exposure.  The ascending aorta was normal in  appearance.  The undersurface of the ascending aorta was dissected away  from the anterior surface of the right pulmonary artery.  Two concentric  pursestring sutures were placed on the anterolateral surface of the  ascending aorta for subsequent placement of antegrade cardioplegic  cannula.  A retrograde cardioplegic cannula was placed through the right  atrium into the coronary sinus, using transesophageal echocardiogram for  guidance.  Antegrade cardioplegic cannula was now placed in the  ascending aorta.   The patient was placed in Trendelenburg position.  The aortic crossclamp  was applied and  cold blood cardioplegia was delivered initially in an  antegrade fashion through the aortic root.  Supplemental cardioplegia  was administered retrograde through the coronary sinus catheter, and the  patient was cooled systemically to 28-degree centigrade.  The initial  cardioplegic arrest was rapid with rapid diastolic arrest.  Repeat doses  of cardioplegia were administered every 20 minutes throughout the  crossclamp portion of the operation retrograde through the coronary  sinus catheter, then completely flat electrocardiogram.  The right chest  was insufflated with carbon dioxide gas throughout the operation through  a 10-mm thoracoscopic port was placed through a separate stab incision  in the inframammary crease.   A left atriotomy incision was performed posteriorly through the  interatrial groove.  The incision was completed partway across the back  wall of the left atrium through the oblique sinus.  The floor of the  left atrium in the mitral valve was exposed using retractor blade, which  was attached to a side arm placed through a small stab incision just to  the right side of the sternum through the fourth intercostal space.  Exposure was felt to be excellent.  The mitral valve was carefully  examined.  The mitral valve has gross appearance, classic goal for  chronic rheumatic mitral valve disease.  There was mild mitral stenosis  with severe mitral regurgitation.  Both the anterior and posterior  leaflets were severely thickened and restricted do not coapt at all in  the middle.  There was moderate thickening and foreshortening of the  subvalvular apparatus.  Mitral valve repair was felt not to be feasible  nor appropriate under the circumstances.   The ATS cryothermy device was utilized to perform the Cox CryoMaze  procedure.  All lesions were completed using 2 minutes freeze.  Initially, a lesion was placed across the dome of the left atrium  extending from the cephalad  apex of the atriotomy incision around the  anterior and cephalad rim of the left-sided pulmonary veins.  The  ellipse around the pulmonary veins was completed by mirror image lesion  placed across the back wall of the left atrium from the caudad apex of  the atriotomy incision to reach the caudad apex of the left-sided  pulmonary veins.  A lesion was then placed from the caudad apex of the  atriotomy incision across the back wall of the left atrium to reach the  mitral valve annulus posteriorly.  A mirror image lesion was placed  along the epicardial surface of the left atrium underneath this to cross  onto the coronary sinus posteriorly.  This completes the left side  lesion set of the Cox CryoMaze procedure.  The left atrial appendage was oversewn from within the left atrium using  a two-layer closure of running 3-0 Prolene suture.   The mitral valve was excised sharply.  Two paddles of the anterior  mitral leaflet with associated chordae tendineae were preserved and  retracted laterally to be reincorporated into the valve replacement  suture line.  The posterior mitral leaflet was preserved and split in  the midline, preserving the majority of the subvalvular apparatus.  The  mitral valve annulus was sized to accept a 27-mm bioprosthetic tissue  valve.  Mitral valve replacement was performed using interrupted 2-0  Ethibond horizontal mattress pledgeted sutures with pledgets in the  supra-annular position.  The Medtronic Mosaic porcine bioprosthetic  tissue valve (serial number [id number]) was secured in place  uneventfully.  After completion of the valve replacement, the left  ventricle was filled with saline solution and the valve appears to be  perfectly competent.  Rewarming was begun.   The left atriotomy incision was closed posteriorly using a two-layer  closure of running 3-0 Prolene suture after placement of the sump drain  across the mitral valve to serve as a left  ventricular vent.  Epicardial  pacing wires were fixed to the undersurface of the right ventricular  free wall.  One final dose of warm retrograde hot shot cardioplegia was  administered.  The aortic crossclamp was removed after total crossclamp  time of 130 minutes.   The heart began to beat spontaneously without need for cardioversion.  The right-sided lesions of the Cox CryoMaze procedure were now performed  along the epicardial surface of the right atrium.  This consists of  longitudinal lesion extending from the superior to the inferior vena  cava laterally and another oblique incision extending onto the acute  margin.  The retrograde cardioplegic cannula was removed.  The left  ventricular vent was removed.  Atrial pacing wires were fixed to the  right atrial appendage.  The patient was rewarmed to 37-degree  centigrade temperature.  The lungs were ventilated and heart allowed to  fill to evacuate any residual carbon dioxide gas bubbles.  At this  juncture, the valve appears to be functioning normally.  The antegrade  cardioplegic cannula was then removed.   The patient was weaned from cardiopulmonary bypass without difficulty.  The patient's rhythm at separation from bypass was a junctional rhythm  with narrow complex.  AV sequential pacing was employed.  No inotropic  support was required.  Total cardiopulmonary bypass time for the  operation was 198 minutes.   Followup transesophageal echocardiogram performed by Dr. Noreene Larsson after  separation from bypass demonstrates normal left ventricular function.  There was a well-seated bioprosthetic tissue valve in the mitral  position.  The valve was functioning normally.  There was no mitral  regurgitation.  There was no sign of any perivalvular leak.  The patent  foramen ovale has been closed.   The mediastinum was drained using a 28-French Bard drain exited through  the anterior port incision.  Pericardium was reapproximated  laterally  using a patch of CorMatrix Bovine submucosa tissue matrix.   The femoral venous cannula was removed uneventfully.  The femoral  arterial cannula was removed uneventfully.  After securing pursestring  sutures, there was a palpable pulse in the distal right femoral artery.  The right internal jugular cannula was removed.  Protamine was administered to reverse the anticoagulation.   The right chest was irrigated with saline solution.  Meticulous  hemostasis was ascertained.  The patient was transfused four packs of  adult platelets and 4 units of fresh frozen plasma due to  thrombocytopenia and coagulopathy.  CoSeal tissue sealant was placed to  the raw surface area of the visceral surface of the lung where previous  adhesions have been divided.  The right chest was now drained using one  chest tube and 1-French Bard drain, each exited through separate stab  incisions under the right breast.  The thoracotomy incision was closed  in multiple layers in routine fashion.  The thoracotomy skin incision  was closed with interrupted vertical mattress Prolene skin sutures.  The  right groin incision was inspect for hemostasis and then subsequently  closed in multiple layers in routine fashion.  The groin incision  closure was completed with the subcuticular skin closure.  The chest  tubes were fixed to closed suction drainage device.   The patient tolerated the procedure well.  The patient was reintubated  in the operating room using a single-lumen endotracheal tube and  subsequently transported to the surgical intensive care unit in stable  condition.  There were no intraoperative complications.  All sponge,  instrument, and needle counts were verified and correct at completion of  the operation.      Salvatore Decent. Cornelius Moras, M.D.  Electronically Signed     CHO/MEDQ  D:  05/23/2009  T:  05/23/2009  Job:  [id number]   cc:   Learta Codding, MD,FACC  Donn Pierini, M.D.

## 2011-05-04 NOTE — Assessment & Plan Note (Signed)
[facility]                          [name] OFFICE NOTE   [name], [name]                  MRN:          [id number]  DATE:03/18/2008                            DOB:          04-26-1925    PRIMARY CARDIOLOGIST:  Learta Codding, MD, [facility].   REASON FOR VISIT:  Scheduled followup.  Please refer to my previous  office note of January 30, for full details.   The patient returns to our clinic now complaining of persistent  intermittent dizziness over the preceding 2 weeks.  She has not  experienced any falls or frank syncope, however.  She denies any tachy  palpitations, which she clearly felt prior to undergoing successful DC  cardioversion in October 2007.  She is on chronic Coumadin and her INR  today is 3.8.   The patient also denies any PND or orthopnea, but does refer to  worsening chronic lower extremity edema.  She is on maintenance Lasix.   She also refers to having had one episode of chest pain last week, at  rest, for which she took one sublingual nitroglycerin with relief.  Her  cardiac catheterization this past January, however, suggested minimal  nonobstructive CAD, but with a 70% ostial stenosis of the diagonal  branch.   Notably, the patient was discharged on an increased dose of Synthroid  when she left [facility] in mid January.  Her TSH at that time was 24,  but she has not had a followup level.   Electrocardiogram in our office today reveals continued maintenance of  normal sinus rhythm at a rate of 80 beats per minute with normal axis,  no ischemic changes.   CURRENT MEDICATIONS:  1  Lasix 40 daily.  1. K-Dur 10 daily.  2. Metoprolol 12.5 b.i.d.  3. Prevacid 30 b.i.d.  4. Humulin 40 units b.i.d.  5. Digoxin 0.125 daily.  6. Coumadin 5 mg as directed.  7. Synthroid 0.1 mg daily.  8. Fish oil 1000 daily.  9. Tricor 145 daily.   PHYSICAL EXAMINATION:  VITAL SIGNS:  Blood pressure 114/66, pulse 82,  regular  weight 131 (up 1).  GENERAL:  An 77 year old female sitting upright in no distress.  HEENT:  Normocephalic, atraumatic.  NECK:  Palpable carotid pulses without bruits.  LUNGS:  Clear to auscultation all fields.  HEART:  Regular rhythm (S1, S2). No significant murmurs.  No rubs.  ABDOMEN:  Soft, nontender, intact bowel sounds.  EXTREMITIES:  2+ bilateral pitting edema.  NEUROLOGIC:  No focal deficit.   IMPRESSION:  1. Intermittent dizziness.      a.     No documented orthostatic changes.      b.     Maintaining normal sinus rhythm by electrocardiogram.  2. Rheumatic heart disease.      a.     Status post remote mitral valve commissurotomy [facility]       [facility]).      b.     Mild mitral stenosis, moderate mitral regurgitation by 2-D       echocardiogram, March 2008.  3. Chronic diastolic heart failure.  4.  History of paroxysmal atrial fibrillation.      a.     Maintaining normal sinus rhythm.      b.     Status post successful TEE - DCCV, October 2007.  5. Chronic Coumadin.  6. Nonobstructive coronary artery disease by cardiac catheterization,      January 2009.  7. Normal ventricular function.  8. Insulin-dependent diabetes mellitus.  9. Dyslipidemia.  10.Hypothyroidism.   PLAN:  1. Extensive blood work with complete metabolic profile, CBC, BMP, and      TSH level.  Regarding the latter, she had a level of approximately      25 when she was at [facility] earlier this year.  Her Synthroid was      up titrated to 100 mcg at that time, but it does not appear that      she has had a followup level.  Will discontinue Digoxin, given her      continued maintenance of NSR and normal left ventricular function.  2. Patient advised to wear compression stockings during the day, when      her chronic lower extremity edema is at its worst.  She is      otherwise to continue on maintenance Lasix.  3. Defer proceeding with a CardioNet monitor at this time to exclude       intermittent dysrhythmia as possible etiology for her dizziness.      She appears to be maintaining normal sinus rhythm by history, as      well as by documented electrocardiogram.  4. Schedule return clinic followup with myself and Dr. Andee Lineman in 2      months for reassessment      of her clinical status.  We will schedule a surveillance 2-D      echocardiogram at that time as well, for continued close monitoring      of her known mitral valve disease.      Gene Serpe, PA-C  Electronically Signed      Learta Codding, MD,FACC  Electronically Signed   GS/MedQ  DD: 03/18/2008  DT: 03/18/2008  Job #: [id number]   cc:   Lia Hopping

## 2011-05-04 NOTE — Assessment & Plan Note (Signed)
[facility]                          [name] NOTE   [name], [name]                  MRN:          [id number]  DATE:01/19/2008                            DOB:          [birth year]    PRIMARY CARDIOLOGIST:  Learta Codding, MD   REASON FOR VISIT:  Post-hospital follow-up.   [name] returns to our clinic after we recently transferred her  from [facility] to [facility] for further evaluation of symptoms  worrisome for unstable angina pectoris.  Of note, her troponins were  marginally elevated with a peak of 0.08.  She had no documented history  of coronary artery disease, but did present with several cardiac risk  factors.  She was otherwise not in congestive heart failure and  continued to maintain normal sinus rhythm, after undergoing successful  TEE-DC cardioversion, by Dr. Andee Lineman, in October 2007.  Coumadin was  placed on hold and she was bridged with unfractionated heparin.   Upon arrival to [facility], the patient continued to report severe chest  pain and follow-up EKGs suggested worsening ST-segment changes.  The  patient was therefore taken directly to the cardiac catheterization lab.  This, however, revealed only mild, nonobstructive CAD with normal left  ventricular function.   The patient was subsequently evaluated for possible pulmonary embolus,  despite having a therapeutic INR level.  A D-dimer was normal and a CT  angiogram of the chest was negative for pulmonary embolus.   The patient was then referred for a GI evaluation and found to have mild  reflux esophagitis with a hiatal hernia, by upper endoscopy.  Recommendations consisted of up-titrating Prevacid to b.i.d. dosing.   The patient has not had any recurrent chest pain since her  hospitalization.  She has not noted any oozing from her right groin  incision site, but does complain of some residual soreness.   She is also concerned that she has  a recurrent UTI.  Of note, I placed  her on ciprofloxacin 250 mg b.i.d. (x3) days at time of her transfer to  [facility], following an admission urinalysis positive for bacteria.   CURRENT MEDICATIONS:  1. Coumadin 5 mg as directed.  2. Synthroid 0.1 mg daily.  3. Tricor 48 mg daily.  4. Digoxin 0.125 mg daily.  5. Humulin 40 units b.i.d.  6. Metoprolol 12.5 mg b.i.d.  7. Lasix 40 mg daily.  8. K-Dur 10 mg daily.  9. Prevacid 30 mg b.i.d.  10.Fish oil 1000 mg daily.   PHYSICAL EXAM:  Blood pressure 118/68, pulse 66, regular, weight 130.  GENERAL:  An 77 year old of female sitting upright, in no distress.  HEENT:  Normocephalic, atraumatic.  NECK:  Palpable bilateral carotid pulses with soft carotid bruits (left  greater right); no JVD at 90 degrees.  LUNGS:  Clear to auscultation in all fields.  HEART:  Regular rate and rhythm (S1 and S2). No significant murmurs.  ABDOMEN:  Soft, nontender.  EXTREMITIES:  Soft right groin with no ecchymosis or hematoma; some  tenderness to palpation; high-pitched bruit on auscultation; no bruit on  the left.  Pedal edema  1+ with minimally palpable right dorsalis pedis  pulse.  NEUROLOGIC:  No focal deficit.   IMPRESSION:  1. Noncardiac chest pain.      a.     Suspect secondary to reflux esophagitis.      b.     Nonobstructive coronary artery disease by recent cardiac       catheterization.      c.     Negative CT angiogram of chest for pulmonary embolus.  2. Rheumatic heart disease.      a.     Status post remote mitral valve commissurotomy [facility]).      b.     Mild mitral stenosis, moderate mitral regurgitation by 2-D       echo, March 2008.  3. Chronic diastolic heart failure.  4. History of paroxysmal atrial fibrillation.      a.     Maintaining normal sinus rhythm.      b.     Status post successful TEE-DC cardioversion, October 2007.  5. Chronic Coumadin.  6. Right groin bruit.  7. Probable recurrent urinary tract infection.  8.  Insulin-dependent diabetes mellitus.  9. Dyslipidemia.  10.Hypothyroidism.      a.     Synthroid recently up titrated, secondary to elevated TSH.   PLAN:  1. Schedule right groin ultrasound today for further evaluation of      bruit and rule out of a pseudoaneurysm.  Check a CBC level, as      well.  2. Repeat urinalysis and rechallenge with a second trial of      ciprofloxacin, this time at 500 mg b.i.d. x3 days.  3. In light of significant financial constraints, I recommended      starting Zantac 300 mg b.i.d. for treatment of GERD, following      completion of current samples of Prevacid which were      provided by Dr. Olena Leatherwood.  4. Schedule return clinic follow-up with myself and Dr. Andee Lineman in 2      months.      Gene Serpe, PA-C  Electronically Signed      Learta Codding, MD,FACC  Electronically Signed   GS/MedQ  DD: 01/19/2008  DT: 01/19/2008  Job #: [id number]   cc:   Lia Hopping

## 2011-05-04 NOTE — Assessment & Plan Note (Signed)
OFFICE VISIT   [name], [name]  DOB:  03-Jul-1925                                        May 20, 2009  CHART #:  [id number]   The patient returns to the office today for further preoperative  discussion prior to elective surgery scheduled for later this week.  She  was originally seen on consultation on May 12, 2009, and a full history  and physical examination was dictated at that time.  Since then, she  underwent CT angiogram of the thoracic and abdominal aorta.  This  reveals no significant flow-limiting atherosclerosis in the descending  thoracic aorta or iliac vessels.  No other significant pathologic  abnormalities are noted.  There was evidence for chronic obstructive  pulmonary disease as well as tiny nodularity in the right lower lobe,  although nothing with any suggestion of pathologic significance.  The  patient has also been seen in followup by Dr. Trey Sailors from  Neurosurgery.  She is known to have significant spinal stenosis that may  ultimately require surgical intervention, but this would need to be  delayed at least 2 or 3 months following her heart surgery.   I again reviewed matters briefly with the patient and her son here in  the short stay area at [facility] this afternoon.  We  tentatively plan to proceed with right miniature thoracotomy for mitral  valve repair or replacement and crown maze procedure on Thursday June 3.  This highly likely that the patient's valve will not be repairable, and  under the circumstances, we will plan to replace her valve using a  bioprosthetic tissue valve.  She understands and accepts all associated  risks of surgery and desires to proceed as described.   Salvatore Decent. Cornelius Moras, M.D.  Electronically Signed   CHO/MEDQ  D:  05/20/2009  T:  05/21/2009  Job:  [id number]   cc:   Learta Codding, MD,FACC  Payton Doughty, M.D.

## 2011-05-04 NOTE — Op Note (Signed)
[name], [name]           ACCOUNT NO.:  [id number]   MEDICAL RECORD NO.:  [id number]          PATIENT TYPE:  INP   LOCATION:  2311                         FACILITY:  [facility]   PHYSICIAN:  Guadalupe Maple, M.D.  DATE OF BIRTH:  1925/01/14   DATE OF PROCEDURE:  05/23/2009  DATE OF DISCHARGE:                               OPERATIVE REPORT   PROCEDURE:  Intraoperative transesophageal echocardiography.   Ms. [name] is an 77 year old white female with a history of  mitral stenosis and severe mitral regurgitation with congestive heart  failure and paroxysmal atrial fibrillation who is scheduled to undergo a  mitral valve repair or replacement by Dr. Cornelius Moras using the minimally  invasive technique.  The intraoperative transesophageal echocardiography  was requested to evaluate the mitral valve and to assist with the  procedure and to determine if any other valvular pathology is present  and to serve as a monitor for intraoperative volume status.   The patient was brought to the operating room of [facility].  General anesthesia was induced without difficulty.  The trachea was  intubated without difficulty.  The transesophageal echocardiography  probe was inserted into the esophagus without difficulty.   IMPRESSION:  Prebypass Findings:  1. Aortic valve.  Aortic valve leaflets were slightly thickened and      open normally.  There was no aortic insufficiency.  No      calcification of leaflets noted.  2. Mitral valve.  The mitral leaflets were thickened, and the valve      appears stenotic with a characteristic hockey stick deformation in      diastole.  Continuous wave Doppler of mitral valve inflow revealed      a mean gradient of 5 mmHg and the mitral valve area of 1.5 cm      squared by pressure halftime.  There was moderate-to-severe mitral      regurgitation with the central jet.  This appeared to be localized      in the area of the anterolateral commissure  and to the posterior      medial commissure to a lesser extent.  3. Left ventricle.  The left ventricular function appeared vigorous      with left ventricular ejection fraction estimated at 65-70%.  Left      ventricular wall thickness measured 1.1 cm, concentric.  Left      ventricular end-diastole diameter measured 5.1 cm at the mid      papillary level.  4. Right ventricle.  The right ventricular walls appeared to have some      increased thickness.  There was good contractility of the right      ventricular free wall and normal appearing right ventricular      systolic function.  5. Tricuspid valve.  The tricuspid valve appeared totally intact.      Leaflets were not thickened and there was no tricuspid      insufficiency that could be appreciated.  6. Interatrial septum.  There was a patent foramen ovale present by      color Doppler, which was in the superior section of  the foramen      ovale.  The foramen ovale also appeared to bow from left to right      suggestive of increased pressure in the left atrium.  7. Left atrium.  The left atrial cavity was enlarged and measured 5.8      cm in superior inferior diameter and 5.6 cm in lateral to medial      dimension.  There was no thrombus noted in the left atrium or left      atrial appendage.  8. Ascending aorta.  The ascending aorta showed increased thickness of      the aortic wall with some calcification noted at the sinotubular      ridge anteriorly.  There were no mobile plaques noted.  9. Descending aorta.  There was mild atheromatous disease of the      descending aorta.  Aortic diameter measured 2.4 to 2.5 cm in      diameter.   Postbypass Findings:  1. Aortic valve.  Aortic valve is unchanged from the prebypass study.      There was no aortic insufficiency and the valve opened normally.  2. Mitral valve.  There was a bioprosthetic valve in the mitral      position, which appeared to open normally.  There was no mitral       insufficiency appreciated.  The sewing ring was well seated.  There      was no perivalvular insufficiency.  The continuous wave Doppler      inflow of the mitral valve revealed a mean gradient of 1-2 mmHg.  3. Left ventricle.  The left ventricular function again showed a good      contractility in all segments interrogated.  Ejection fraction is      65-70%.  4. Right ventricle.  The right ventricular function again showed good      systolic function and good contractility of the right ventricular      free wall.  5. Interatrial septum.  There was no residual patent foramen ovale      that could be appreciated.           ______________________________  Guadalupe Maple, M.D.     DCJ/MEDQ  D:  05/23/2009  T:  05/24/2009  Job:  [id number]

## 2011-05-04 NOTE — Assessment & Plan Note (Signed)
[place] [facility]                          [name] NOTE   [name], [name]                  MRN:          [id number]  DATE:03/18/2008                            DOB:          16-Nov-1925    ADDENDUM   PLAN:  Will discontinue Digoxin, given her continued maintenance of NSR  and normal left ventricular function.      Gene Serpe, PA-C       Bruce R. Juanda Chance, MD, [facility]    GS/MedQ  DD: 03/18/2008  DT: 03/18/2008  Job #: [id number]

## 2011-05-04 NOTE — Assessment & Plan Note (Signed)
[facility]                          [name] NOTE   ELNOR, RENOVATO                  MRN:          [id number]  DATE:07/18/2008                            DOB:          [birth year]    CARDIOLOGIST:  Learta Codding, MD, [facility].   PRIMARY CARE PHYSICIAN:  Dr. Lia Hopping   REASON FOR VISIT:  A 65-month followup.   HISTORY OF PRESENT ILLNESS:  Ms. Larocque is a very pleasant 77 year old  female patient with a history of rheumatic heart disease status post  mitral valve commissurotomy at [facility] in 1993,  who has recently documented mild mitral stenosis and moderate mitral  regurgitation by echocardiogram in March 2008.  She is on chronic  Coumadin therapy secondary to paroxysmal atrial fibrillation.  She  underwent a TEE-guided cardioversion in October 2007.  She has chronic  diastolic congestive heart failure.  She had chest pain back in January  2009 and underwent cardiac catheterization that revealed mild  nonobstructive disease.  She did have a 70% lesion in the ostial  diagonal branch of the LAD.  This was treated medically.   The patient was last seen in the office in March 2009.  At that point in  time, she was doing well.  She did note some intermittent dizziness.  She had no tachy palpitations.  Blood work was performed.  She had a  CMET, TSH, BNP, and a CBC.  Her labs were unremarkable, except for  glucose of 314, BUN 24, sodium 134, and BNP 116.  Of note, her TSH was  normal at 4.31.   During her stay, the patient notes she is doing well.  She denies chest  pain, shortness of breath, syncope, near-syncope, orthopnea, PND, or  pedal edema.   CURRENT MEDICATIONS:  1. Lasix 40 mg daily.  2. K-Dur 10 mEq daily.  3. Metoprolol 25 mg half-tablet b.i.d.  4. Humulin insulin 40 units b.i.d.  5. Coumadin as directed.  6. Synthroid 0.1 mg daily.  7. Fish oil 1 g daily.  8. Tricor 145 mg daily.  9.  Ranitidine 300 mg b.i.d.   PHYSICAL EXAMINATION:  GENERAL:  She is a well-nourished, well-developed  female, in no acute distress.  VITAL SIGNS:  Blood pressure is 104/59, pulse 83, and weight 139.4  pounds.  HEENT:  Normal.  NECK:  Without JVD.  CARDIAC:  Normal S1 and S2.  Regular rate and rhythm.  LUNGS:  Clear to auscultation bilaterally.  ABDOMEN:  Soft and nontender.  EXTREMITIES:  Without edema.  NEUROLOGIC:  She is alert and oriented x3.  Cranial nerves II-XII are  grossly intact.   IMPRESSION:  1. Rheumatic heart disease.      a.     Status post mitral valve commissurotomy at [facility] in 1993.      b.     Mild mitral stenosis and moderate mitral regurgitation by       echocardiogram in March 2008.  2. Chronic diastolic congestive heart failure.  3. Good left ventricular function.  4. Nonobstructive coronary artery disease by catheterization in June  2009.  5. Paroxysmal atrial fibrillation.      a.     Coumadin therapy.  6. Diabetes mellitus.  7. Dyslipidemia.  8. Treated hypothyroidism.   PLAN:  Ms. Holbein is doing well overall.  By her history and exam, she  is maintaining normal sinus rhythm.  She is walking on a daily basis  without symptoms of chest pain or shortness of breath.  No medication  changes will be made today.  She is in need of followup echocardiography  to reassess her mitral valve.  That will be accomplished in the next  couple of months.  I will bring her back in followup with Dr. Andee Lineman in  6 months.  She is to return sooner or p.r.n.      Tereso Newcomer, PA-C  Electronically Signed      Jonelle Sidle, MD  Electronically Signed   SW/MedQ  DD: 07/18/2008  DT: 07/19/2008  Job #: [id number]   cc:   Lia Hopping

## 2011-05-04 NOTE — Assessment & Plan Note (Signed)
[facility]ALTHC[facility]                          [name] NOTE   [name], [name]                  MRN:          [id number]  DATE:12/10/2008                            DOB:          June 05, 1925     __________ with exertion.  Of note, she has gained 11 pounds since her  last visit, but attributes this to increased caloric intake.   With respect to her medications, she has cut back on her Pacerone to  current dose of 200 mg daily, as previously recommended by Dr. Andee Lineman.   The patient does prefer to having reflux disease, and is currently on  Zantac.   CURRENT MEDICATIONS:  1. Lasix 40 mg daily.  2. Pacerone 1 tablet daily.   PHYSICAL EXAMINATION:  NECK:  Bilateral carotid pulses without bruits;  no JVD.  LUNGS:  Clear to auscultation in all fields.  HEART:  Regular rate and rhythm.  No significant murmurs.  No rubs.  ABDOMEN:  Benign.  EXTREMITIES:  Trace pedal edema.  NEURO:  Alert and oriented.   IMPRESSION:  1. Atypical chest discomfort.      a.     Nonobstructive coronary artery disease by cardiac       catheterization, January 2009.  2. Normal left ventricular function.  3. Valvular heart disease.      a.     Moderate mitral stenosis/mitral regurgitation.  4. Chronic Coumadin.  5. History of atrial fibrillation/flutter.      a.     Status post successful direct current cardioversion.  6. Insulin-dependent diabetes mellitus.  7. Dyslipidemia.  8. Gastroesophageal reflux disease.  9. Volume overload.  10.Treated hypothyroidism.   1. Up titrate Lasix to 40 mg b.i.d. x5 days, then resume maintenance      level of 40 mg daily.  I suspect that the patient's chest pressure      that she feels when lying supine maybe due to development of mild      volume overload.  We will also double her potassium dose during      this same time frame.  We will check a followup BMET in 1 week.  2. Add omeprazole 20 mg daily to her regimen, in  the event that this      supine chest discomfort      is, in fact, due to breakthrough, nocturnal reflux.  3. Schedule return clinic followup with myself and Dr. Andee Lineman in 4      months.      Gene Serpe, PA-C       Learta Codding, MD,FACC    GS/MedQ  DD: 12/10/2008  DT: 12/11/2008  Job #: [id number]   cc:   Lia Hopping

## 2011-05-04 NOTE — Assessment & Plan Note (Signed)
OFFICE VISIT   KAELEY, VINJE  DOB:  Mar 15, 1925                                        June 13, 2009  CHART #:  [id number]   The patient returns for routine followup, status post right miniature  thoracotomy for mitral valve replacement with a bioprosthetic tissue  valve and Cox-CryoMaze procedure with closure of patent foramen ovale on  May 23, 2009.  Her postoperative recovery has been uneventful.  She was  discharged to skilled nursing rehab facility in [place] at [facility] where she has continued to recover uneventfully.  Since then  she was seen by a nurse in followup at the [facility]  where her prothrombin time has been checked and Coumadin dose monitored.  She returns to our office today and reports that she is doing quite  well.  She has acute occasional pains across her right chest, but she  has not taken any sort of pain medication and this has really not been  problematic or bothersome at all.  She has no shortness of breath.  Her  appetite is good.  Her physical activity has improved nicely and she is  getting around much better despite her pre-existing limitations.  Overall, she has no complaints.  Medications remain unchanged from the  time of hospital discharge.   PHYSICAL EXAMINATION:  GENERAL:  A well-appearing female.  VITAL SIGNS:  Blood pressure 126/71, pulse 76 and somewhat irregular and  two-channel telemetry rhythm strip demonstrates what appears to be  atrial bigeminy with sinus rhythm and ectopic atrial beats.  This could  represent atrial flutter.  The rate is stable.  Oxygen saturation is 95%  on room air.  CHEST:  All of her surgical incisions are healing nicely.  All of her  skin sutures have been removed.  CARDIOVASCULAR:  Regular rhythm with no murmurs, rubs, or gallops noted.  ABDOMEN:  Soft and nontender.  EXTREMITIES:  Warm and well perfused.  There is no lower extremity  edema.   DIAGNOSTIC  TEST:  Chest x-ray performed today at the [facility] is reviewed.  This demonstrates her chest x-ray is clear with  trivial pleural effusions.  The cardiac silhouette is stable.  No other  abnormalities are noted.   IMPRESSION:  The patient looks quite good at this time.  She is  recovering nicely from surgery.  Her heart rhythm appears to be either  sinus rhythm with atrial bigeminy or perhaps atrial flutter.  Her rate  is well controlled.  She remains on Coumadin and amiodarone.  I think we  should probably continue that indefinitely for now.  She can continue to  increase her physical activity as tolerated without any particular  limitations at this point in time.   PLAN:  I have encouraged the patient to start increasing her activity  and it is possible that she will be able to get out of the skilled  nursing facility and go home within the next week or so.  She may need  some home health physical therapy once she makes that transition.  We  have not made any changes in her current medications and will ultimately  leave that to the discretion of Dr. Andee Lineman and colleagues.  We will plan  to see her back in 6 weeks.   Marilu Favre  Zadie Cleverly, M.D.  Electronically Signed   CHO/MEDQ  D:  06/13/2009  T:  06/14/2009  Job:  [id number]   cc:   Learta Codding, MD,FACC  Donn Pierini, M.D.

## 2011-05-04 NOTE — Assessment & Plan Note (Signed)
[facility]                          [name] OFF[facility] NOTE   [name], [name]                  MRN:          [id number]  DATE:09/26/2008                            DOB:          February 01, 1925    REFERRING PHYSICIAN:  Dr. Olena Leatherwood.   HISTORY OF PRESENT ILLNESS:  The patient is a very pleasant 77 year old  female with a history of atrial fibrillation, flutter, and moderate  mitral stenosis.  She was recently hospitalized with congestive heart  failure with recurrence of atrial fibrillation.  She underwent DE-guided  cardioversion successfully.  There were clear instructions given by me  to place her on amiodarone 400 mg p.o. b.i.d. x5 days and to get back to  400 mg p.o. daily, and this was on September 06, 2008.  Unfortunately,  Dr. Olena Leatherwood on his discharge papers wrote 400 mg p.o. b.i.d. without any  time limit.  Also, the patient on her nursing medication sheet had to  fill in herself the dosage of the medication.  However, it was clearly  written in my last progress note that amiodarone only could be used at  the dose of 400 mg p.o. b.i.d. for 5 days and then to cut to 400 mg a  day for 30 days and then to 200 mg a day.  I am not exactly sure why  this was not done at discharge.  Anyway, I spoke with the patient about  this and told her that we did not write those orders.  Therefore, this  was not our responsibility.   Overall, the patient feels well.  She has no chest pain, no shortness of  breath.   MEDICATIONS:  1. Lorazepam 0.5 mg p.o. t.i.d.  2. Coumadin as directed.  3. Lasix 20 mg p.o. daily.  4. Micro-K.  5. Levothyroxine.  6. Metoprolol 12.5 mg p.o. b.i.d.  7. Fish oil.  8. Humulin N.  9. Pacerone 400 mg p.o. b.i.d.   PHYSICAL EXAMINATION:  VITAL SIGNS:  Blood pressure 121/71, heart rate  53, weight 145 pounds.  NECK:  Normal carotid upstroke, no carotid bruits.  LUNGS:  Clear breath sounds bilaterally.  HEART:   Regular rate and rhythm, was somewhat bradycardic with normal S1  and S2.  Soft diastolic murmur at the left lower sternal border.  ABDOMEN:  Soft and nontender.  No rebound or guarding.  Good bowel  sounds.  EXTREMITIES:  No cyanosis, clubbing, or edema.  NEUROLOGIC:  The patient is alert and oriented, grossly nonfocal.   PROBLEMS:  1. Status post atrial fibrillation and atrial flutter with direct      current cardioversion.  2. Moderate mitral stenosis.  3. Mitral regurgitation.  4. Normal left ventricular ejection fraction.  5. Nonobstructive coronary artery disease.   PLAN:  The patient has been cut back to 200 mg p.o. b.i.d. on her  amiodarone until September 06, 2008, and then she can take 200 mg p.o.  daily.  Cardizem was already previously stopped.  Her metoprolol can be  continued.  We will check the PT/INR today, which fortunately was not  tremendously elevated, but only at  4.1, and we made some minor changes  in her Coumadin regimen.  I reviewed the patient's EKG and she is in  normal sinus rhythm with strong P-wave activity.  The patient can follow  up with [place] in the next couple of months.     Learta Codding, MD,FACC  Electronically Signed    GED/MedQ  DD: 09/26/2008  DT: 09/26/2008  Job #: [id number]   cc:   Lia Hopping

## 2011-05-04 NOTE — Cardiovascular Report (Signed)
NAMECLAUDIE, [name]           ACCOUNT NO.:  [id number]   MEDICAL RECORD NO.:  [id number]          PATIENT TYPE:  OIB   LOCATION:  1966                         FACILITY:  [facility]   PHYSICIAN:  Arturo Morton. Riley Kill, MD, FACCDATE OF BIRTH:  [birth year]   DATE OF PROCEDURE:  DATE OF DISCHARGE:                            CARDIAC CATHETERIZATION   INDICATIONS:  [name] is an 77 year old on Coumadin.  She has had  prior valvuloplasty.  She has had recent TEE suggesting moderate mitral  stenosis and moderate severe mitral regurgitation without evidence of  shunt or PFO.  She has had stable lower extremity edema.  She has had  increasing shortness of breath and needs back surgery.  Current study  was done to assess both coronary anatomy and the severity of her mitral  disease.   PROCEDURES:  1. Right and left heart catheterization.  2. Selective coronary arteriography.  3. Selective left ventriculography.  4. Hand injection in the aortic root.   DESCRIPTION OF PROCEDURE:  The patient was brought to the  catheterization laboratory after informed consent and prepped and draped  in the usual fashion.  Through an anterior puncture, the femoral vein  was entered.  We then placed a 7-French sheath.  A Swan-Ganz catheter  was then taken to the superior vena cava where a saturation was  obtained.  Serial right heart pressures were then recorded and the PA  sat obtained.  Thermodilution cardiac outputs were then performed.  Following this, the right femoral artery was entered and a 4-French  sheath was placed.  A pigtail was then placed in the central aorta and  left ventricle.  Simultaneous wedge LV was then performed.  Ventriculography was then performed in both the RAO and LAO projections.  Following a pressure pullback, hand injection was done into the aorta.  Coronary arteriography was then performed without complication.  The  patient tolerated the procedure well.  She was taken to the  holding area  in satisfactory clinical condition.   HEMODYNAMIC DATA:  1. Aortic saturation 93%.  2. PA saturation 65%.  3. SVC saturation 61%.  4. Right atrial pressure 14/11/10.  5. RV 46/10.  6. Pulmonary artery 46/21, mean 34.  7. Pulmonary capillary wedge 30.  8. V wave 46.  9. Aortic 141/66, mean 96.  10.LV 151/27.  11.Thermodilution cardiac output 3.7 liters per minute.  12.Thermodilution cardiac index 2.1 liters per minute per meter      squared.  13.Fick cardiac output 4.1 liters per minute.  14.Fick cardiac index 2.5 liters per minute per meter squared.  15.Mitral valve area by thermodilution technique 1.05 centimeter      squared.  16.Mitral valve area by thermodilution 1.29 centimeter squared.   ANGIOGRAPHIC DATA:  1. Ventriculography done in the RAO projection reveals vigorous global      systolic function without segmental wall motion abnormality.  This      was done in both the RAO and LAO projections.  There appeared to be      at least 3+ MR with some flow probably into the pulmonary veins.  2. Hand injection to the  aortic root did not reveal significant aortic      regurgitation.  3. The left main is free of critical disease.  4. The left anterior descending artery courses to the apex.  There was      a somewhat diabetic appearance to the vessel.  There was a tiny      first diagonal branch with probably less than 50%, although this      was called 70% in the previous study.  The second diagonal was      large and without significant narrowing.  5. The circumflex probably has about 30% ostial narrowing.  There was      a small marginal that has diabetic appearance, the larger marginal      system without critical narrowing.  6. The right coronary is fairly large-caliber vessel with posterior      descending and posterolateral system.  High-grade focal stenosis is      not noted.   CONCLUSIONS:  1. Moderately-to-severe mitral stenosis and mitral  regurgitation.  2. V wave 46.  3. Scattered coronary irregularities with diabetic appearing arteries      without significant focal high-grade stenosis.   DISPOSITION:  Follow up with Dr. Andee Lineman will be recommended next week.  The patient does have advanced age, so options will have to be  considered.      Arturo Morton. Riley Kill, MD, [facility]  Electronically Signed     TDS/MEDQ  D:  04/30/2009  T:  05/01/2009  Job:  [id number]   cc:   Learta Codding, MD,FACC  Donz[facility] Sprung  [facility]

## 2011-05-04 NOTE — Discharge Summary (Signed)
[name], [name]           ACCOUNT NO.:  [id number]   MEDICAL RECORD NO.:  [id number]          PATIENT TYPE:  INP   LOCATION:  2005                         FACILITY:  [facility]   PHYSICIAN:  Everardo Beals. Juanda Chance, MD, FACCDATE OF BIRTH:  03-23-1925   DATE OF ADMISSION:  01/02/2008  DATE OF DISCHARGE:  01/05/2008                               DISCHARGE SUMMARY   PRIMARY CARDIOLOGIST:  Learta Codding, MD,FACC.   PRIMARY CARE PHYSICIAN:  Dr. Lia Hopping.   DISCHARGE DIAGNOSES:  Chest pain.   SECONDARY DIAGNOSES:  1. Nonobstructive coronary artery disease by catheterization this      admission.  2. Paroxysmal atrial fibrillation, status post cardioversion in      October 2007.  3. Chronic Coumadin anticoagulation.  4. History of mild mitral stenosis.  5. Moderate mitral regurgitation status post mitral valve      commissurotomy in 1993.  6. Insulin-dependent-diabetes mellitus.  7. Hypothyroidism.  8. Chronic diastolic congestive heart failure.  9. Small hiatal hernia documented by esophagogastroduodenoscopy this      admission.  10.Gastroesophageal reflux disease.  11.Mild esophagitis documented by esophagogastroduodenoscopy this      admission.  12.History of esophageal stricture status post dilation.   ALLERGIES:  1. ASPIRIN.  2. CODEINE.  3. SULFA.  4. MORPHINE.   PROCEDURES:  1. Left heart cardiac catheterization.  2. CT angio of the chest which was negative for PE.  3. EGD.   HISTORY OF PRESENT ILLNESS:  An 77 year old Caucasian female with the  above problem list.  She was admitted to [facility] on January 01, 2008, with squeezing type chest discomfort.  This occurred at rest  at home.  She was evaluated by cardiology after having ruled out for MI  and the decision was made to transfer her to [facility] for  further evaluation and cardiac catheterization.   HOSPITAL COURSE:  Shortly after transfer to [facility] on  January 13th, she  complained of 10/10 chest pain and ECG showed more  pronounced anterolateral ST segment depression.  She was taken urgently  to the cardiac cath lab, revealing nonobstructive coronary artery  disease with normal LV function and an EF of 60%.  Post procedure D-  dimer was checked and this was normal.  She continued to have  intermittent epigastric and lower chest discomfort that was actually  reproducible with palpation and we obtained a CT angio of the chest  which was negative for pulmonary embolism.  As her symptoms were  reproducible, we asked for a gastroenterology consult as we felt perhaps  GERD could be playing a role.  An EGD was performed on January 16th,  revealing mild reflux esophagitis as well as a hiatal hernia.  GI  recommended switching her Prevacid from daily to b.i.d.  We will plan to  discharge her home this evening in satisfactory condition.   DISCHARGE LABORATORY:  Hemoglobin 10.2, hematocrit 29.8, WBC 5.8,  platelets 165.  INR 1.1.  Sodium 140, potassium 4.2, chloride 104, CO2  27, BUN 18, creatinine 1.15, glucose 165.  Total bilirubin 0.4, alkaline  phosphatase 41,  AST 21, ALT 18, total protein 5.9, albumin 3.1, calcium  8.9, lipase 13.  CK 46, MB 1.6, troponin I 0.01.  Total cholesterol 139,  triglycerides 336, HDL 26, LDL 46.  TSH 24.783.   DISPOSITION:  The patient will be discharged home today in good  condition.   FOLLOWUP PLANS AND APPOINTMENTS:  1. She is to follow up in the [facility] in      [place] on January 20th at 9:45 a.m.  2. She is to follow up with Dr. Andee Lineman on January 30th at 11:30 a.m.   DISCHARGE MEDICATIONS:  1. Prevacid 30 mg b.i.d. (increased from previous dose).  2. Synthroid 100 mcg daily (increased secondary to elevated TSH).  3. Lasix 40 mg daily.  4. K-Dur 10 mEq daily.  5. Metoprolol 25 mg half a tablet b.i.d.  6. Humulin N 40 units b.i.d.  7. Digoxin 0.125 mg daily.  8. TriCor 48 mg daily.  9. Coumadin 5  mg q.h.s.  10.Fish oil 1 tablet daily.  11.Lorazepam 0.5 mg t.i.d. as previously prescribed.   OUTSTANDING LABORATORY STUDIES:  None.   DURATION OF DISCHARGE ENCOUNTER:  Forty-eight minutes including  physician time.      Nicolasa Ducking, ANP      Bruce R. Juanda Chance, MD, [facility]  Electronically Signed    CB/MEDQ  D:  01/05/2008  T:  01/05/2008  Job:  [id number]   cc:   Lia Hopping

## 2011-05-04 NOTE — Assessment & Plan Note (Signed)
[facility]                          [name]ICE NOTE   [name], [name]                  MRN:          [id number]  DATE:06/17/2009                            DOB:          1925/04/25    REFERRING PHYSICIAN:  Lia Hopping   HISTORY OF PRESENT ILLNESS:  The patient is an 77 year old female with a  history of rheumatic heart disease and prior mitral commissurotomy.  The  patient has developed severe mitral regurgitation and moderate mitral  stenosis.  The patient also was persistent atrial fibrillation and she  was referred for mitral valve replacement and Cox maze procedure.  The  patient received a right thoracotomy for mitral valve replacement in 27  mm Medtronic Mosaic porcine tissue valve.  She also received a Cox  CryoMaze procedure closure of patent foramen ovale.  She has done  extremely well postoperatively.  She stated that she feels so much  better.  She has no shortness of breath on exertion.  She has no  dyspnea at rest, orthopnea, or PND.  She denies any fever or chills.  She reports minimal pain around the right thoracotomy site.  Underneath  the right breast, however, there are 2 areas from chest tubes that are  slightly draining purulent material.  This has been inaccessible to the  patient because it is located right under the submammary tissue.  The  patient states that she is fatigued, but she has done extremely well  after her surgery and she has remained in normal sinus rhythm, as a  matter of fact we were called by the nursing home that they were  concern about low heart rates.  We applied a 48-hour Holter monitor and  the heart rate was consistently around 92-100 beats per minute, if  anything more tachycardic.  Certainly no atrial fibrillation was noted.   MEDICATIONS:  1. Coumadin as directed.  2. Synthroid 100 mcg.  3. Fish oil 1000 mg p.o. daily.  4. Lasix 40 mg p.o. daily.  5. Pacerone 200 p.o.  daily.  6. Lyrica 50 mg p.o. daily.  7. Humulin N 40 units q.a.m.  8. Humulin N 30 units q.p.m.  9. Ambien 5 mg p.o. at bedtime.  10.Potassium.  11.Ranitidine.  12.Os-Cal plus vitamin D.   PHYSICAL EXAMINATION:  VITAL SIGNS:  Blood pressure 116/64, heart rate  100, and weight 160 pounds.  GENERAL:  Well-nourished white female in no apparent distress.  HEENT:  Pupils, eyes clear; conjunctivae clear.  NECK:  Supple.  Normal carotid stroke and no carotid bruits.  LUNGS:  Clear breath sounds bilaterally.  HEART:  Regular rate and rhythm with normal S1 and S2, tachycardic.  No  S3.  ABDOMEN:  Soft and nontender.  No rebound or guarding.  Good bowel  sound.  EXTREMITIES:  No cyanosis, clubbing or edema.  NEUROLOGIC:  The patient is alert, oriented and grossly nonfocal.   PROBLEMS:  1. Rheumatic heart disease status post right thoracotomy and mitral      valve replacement with a Medtronic Mosaic porcine tissue valve.  2. Status post Cox CryoMaze procedure with closure of  patent foramen      ovale.  3. History of congestive heart failure.  4. Spinal stenosis with severe degenerative disk disease of the      lumbosacral spine.  5. Hypothyroidism.  6. Chronic obstructive pulmonary disease.  7. Gastroesophageal reflux disease.  8. Status post balloon mitral valvuloplasty in 1993.  9. Sinus tachycardia.   PLAN:  1. The patient did very well after surgery.  She can continue on her      current medical regimen.  The patient will need referral in the      next couple weeks with Dr. Trey Sailors regarding her spinal stenosis      that will need to be dealt with postoperatively.  2. I have given the patient prescription for metoprolol 12.5 p.o.      b.i.d.  We did orthostatics and she was not orthostatic.  She also      needs laboratory work to include CBC, TSH, BMET, and a BNP level.      We obtained a PT/INR, which was markedly elevated today and we will      hold her Coumadin.  3. I also  gave the patient prescription mupirocin topical 2% to apply      to the right submammary area.  4. The patient will follow up with [place] in next couple of weeks and we      will make sure that Dr. Trey Sailors is notified to set a date for her      surgery for spinal stenosis.     Learta Codding, MD,FACC  Electronically Signed    GED/MedQ  DD: 06/17/2009  DT: 06/18/2009  Job #: [id number]   cc:   Lia Hopping

## 2011-05-04 NOTE — Assessment & Plan Note (Signed)
OFFICE VISIT   [name], [name]  DOB:  September 16, 1925                                        July 21, 2009  CHART #:  [id number]   The patient returns to the office today, now 2 months status post right  miniature thoracotomy for mitral valve replacement with a bioprosthetic  tissue valve and Cox-CryoMaze procedure.  She was last seen here in the  office on June 13, 2009.  Since then, she has done well.  She is back  home and independent, and getting along reasonably well under the  circumstances.  Her biggest problem is severe chronic back pain which  predated her heart surgery.  She has not yet gone back to see the  neurosurgeons to reconsider surgical intervention.  She reports that she  has not had any problems with her breathing.  She has not had any  tachypalpitations.  She reports occasional twinge of chest pain, but  overall this has been minimal and her biggest problem is related to  chronic back pain.  The remainder of her review of systems is  unremarkable.   PHYSICAL EXAMINATION:  GENERAL:  A well-appearing female.  VITAL SIGNS:  Blood pressure 135/72, pulse 74 and regular.  Two-channel  telemetry rhythm strip demonstrates normal sinus rhythm.  Oxygen  saturation 97% on room air.  CHEST:  Her thoracotomy incision has healed completely.  LUNGS:  Auscultation reveals clear breath sounds which are symmetrical.  No wheezes or rhonchi are noted.  CARDIOVASCULAR:  Regular rate and rhythm.  No murmurs, rubs, or gallops  are noted.  ABDOMEN:  Soft and nontender.  EXTREMITIES:  Warm and well perfused.  There is no lower extremity  edema.   IMPRESSION:  The patient is doing quite well, now 2 months status post  right miniature thoracotomy for mitral valve replacement with a  bioprosthetic tissue valve and Cox-CryoMaze procedure.  She appears to  be maintaining sinus rhythm.   PLAN:  The patient plans to return to see Dr. Andee Lineman for followup  within  the next 2 weeks.  I think it would be reasonable to consider stopping  amiodarone at this time.  If she continues to do well off amiodarone,  CardioNet monitor demonstrates no occult atrial arrhythmias, it would be  reasonable to consider stopping Coumadin after that.  We will leave this  decision to Dr. Margarita Mail discretion.  We will plan to see the patient  back for routine followup and rhythm check in 4 months.   Salvatore Decent. Cornelius Moras, M.D.  Electronically Signed   CHO/MEDQ  D:  07/21/2009  T:  07/22/2009  Job:  [id number]   cc:   Learta Codding, MD,FACC  Donn Pierini, M.D.

## 2011-05-04 NOTE — Assessment & Plan Note (Signed)
[facility]                          [name]   [name], [name]                  MRN:          [id number]  DATE:04/24/2009                            DOB:          [birth year]    PRIMARY CARDIOLOGIST:  Learta Codding, MD, [facility]   REASON FOR VISIT:  Scheduled followup.  Please refer to Dr. Margarita Mail  recent note of March 29, for complete details.   The patient returns for scheduled followup, after being recently  referred for both a routine treadmill test, for assessment of baseline  exercise tolerance, as well as a transesophageal echocardiogram.  The  patient has known mitral stenosis, status post prior valvuloplasty, as  well as history of atrial fibrillation/flutter, status post prior  successful cardioversion.  However, she continues to suffer from  significant shortness of breath which has progressed over the past 6  months, or so.   During her treadmill she reported no chest pain, but did experience  significant shortness of breath and fatigue.  She was only able to  attain 70% of PMHR, with peak heart rate of 98 bpm and a maximum blood  pressure of 140/61.  There were no diagnostic EKG changes.   The TEE, reviewed by Dr. Andee Lineman, indicated normal LVF (EF 55 - 60%).  There was no thrombus visualized in the LA.  Regarding the mitral valve,  there was mild/moderate mitral stenosis and moderate/severe mitral  regurgitation.  There was no evidence of a shunt or PFO.   Clinically, [name] reports no significant worsening from her  baseline, since last seen in the office.  Although she has gained 2  pounds, she reports no PND, has stable lower extremity edema (worse on  the left), and chronically sleeps on 2 pillows.  She has some mild  shortness of breath upon completing a flight of stairs.  She also has  occasional chest pain, which is very brief in duration and not  precipitated by any exertion or activity.   Ms.  [name] was recently referred by Dr. Earnestine Leys (who was suspicious of  nerve entrapment syndrome or spinal stenosis ) for evaluation of severe  back pain, and was diagnosed with severe spinal stenosis involving L3-  L5.  She has been referred to Dr. Trey Sailors, here in [place], on May 27.  In  the meanwhile, she was placed on Lyrica 50 mg to help ameliorate her  severe pain.   An EKG in our office today indicates NSR at 72 bpm with normal axis and  no ischemic changes.   ALLERGIES:  SULFA, CODEINE, BACTRIM, and ASPIRIN intolerance.   CURRENT MEDICATIONS:  1. Coumadin 5 mg as directed.  2. Lasix 40 mg daily.  3. K-Dur 10 mEq daily.  4. Metoprolol 12.5 mg b.i.d.  5. Humulin N 40 units b.i.d.  6. Synthroid 0.1 mg daily.  7. Pacerone 200 mg daily.  8. Prevacid 20 mg daily.  9. Lyrica 50 mg daily.  10.Fish oil 1000 mg daily.   PAST MEDICAL HISTORY:  1. Rheumatic heart disease.      a.  Status post mitral valve commissurotomy in 1993 [facility]).      b.     Mild/moderate mitral stenosis; moderate/severe mitral       regurgitation.  2. Paroxysmal atrial fibrillation.      a.     Status post successful TEE-guided DC cardioversion, October       2007.      b.     Chronic Coumadin.  3. Nonobstructive coronary artery disease.      a.     By cardiac catheterization, January 2009.  4. Chronic diastolic heart failure.  5. Hypothyroidism.  6. IDDM.  7. Dyslipidemia.  8. COPD/history of tobacco.  9. GERD.  10.History of small bowel obstruction.  11.History of recurrent bilateral pleural effusions.      a.     Status post multiple thoracentesis and pleurodesis       procedures.  12.Severe spinal stenosis.   SURGICAL HISTORY:  1. Status post lumbar surgery.  2. Status post hysterectomy.  3. Status post cholecystectomy.  4. Status post hemorrhoidectomy.  5. Status post appendectomy.  6. Status post sigmoid colectomy.  7. Status post bilateral cataract surgery.   SOCIAL HISTORY:  The  patient lives alone in [place], [place].  She  denies any tobacco or alcohol use.   FAMILY HISTORY:  Noncontributory for premature coronary artery disease.   REVIEW OF SYSTEMS:  Denies any recent overt bleeding, but aside to brief  episodes of epistaxis several weeks ago.  She continues to experience  severe back pain, and has to do her kitchen chores while seated on a  stool.  She continues to have occasional dizziness, but no frank  syncope.  Otherwise as cited per HPI, all other remaining systems  negative.   PHYSICAL EXAMINATION:  VITAL SIGNS:  Blood pressure 114/67, pulse 71,  regular weight 165 (up 2 pounds).  GENERAL:  A 77 year old female, sitting upright, no distress.  HEENT:  Normocephalic, atraumatic.  PERRLA.  EOMI.  NECK:  Palpable carotid pulse without bruits; no JVD.  LUNGS:  Clear to auscultation bilaterally.  HEART:  Regular rate and rhythm.  A soft grade 1-2/holosystolic murmur  at the base.  No rubs.  ABDOMEN:  Soft, nontender with intact bowel sounds.  EXTREMITIES:  A 1+ left lower extremity edema; trace edema on the right.  SKIN:  Warm and dry.  MUSCULOSKELETAL:  No gross deformity.  NEURO:  No focal deficit.   IMPRESSION:  [name] is an 77 year old female with complex medical  history notable for rheumatic heart disease, history of atrial  fibrillation/ flutter, chronic diastolic heart failure, and  nonobstructive coronary artery disease.   A recent TEE was suggestive of mild/moderate mitral stenosis with  moderate/severe mitral regurgitation, and preserved left ventricular  function.  A recent routine treadmill test indicated limited exercise  capacity, and the patient has continued to be plagued by persistent  exertional dyspnea.  Of note, she continues to maintain normal sinus  rhythm, on amiodarone, and is on chronic Coumadin.   [name] has also recently been diagnosed with severe spinal  stenosis, after referral by Dr. Earnestine Leys, and is  awaiting an evaluation  by Dr. Trey Sailors with respect to possible surgery.   PLAN:  Following review with Dr. Andee Lineman, recommendation is to schedule  the patient for a repeat heart catheterization, including right-sided  heart pressures, for reassessment of her coronary arteries, as well as  further clarification of the severity of her mitral valve.  This is  in  preparation for possible mitral valve surgery in the near future.  However, given the severity of her spinal stenosis, and significant  limitation on her lifestyle, we are waiting further recommendations with  respect to possible surgical repair for this.  In the meanwhile, we will  continue to monitor her closely and to manage her medically.  We will  schedule for an early clinic follow up, following completion of her  elective catheterization.      Gene Serpe, PA-C  Electronically Signed      Learta Codding, MD,FACC  Electronically Signed   GS/MedQ  DD: 04/24/2009  DT: 04/25/2009  Job #: [id number]   cc:   Payton Doughty, M.D.  Lia Hopping

## 2011-05-04 NOTE — Assessment & Plan Note (Signed)
[facility]                          [name] NOTE   PIPPA, HANIF                  MRN:          [id number]  DATE:05/06/2009                            DOB:          1925/11/01    HISTORY OF PRESENT ILLNESS:  Please see my note of March 17, 2009, for  complete details as well as the note of Apr 24, 2009.  In essence, Mrs.  Zahn returns now post-cardiac catheterization which we did for  assessment of her mitral stenosis and mitral regurgitation as well as  coronary artery disease and right-sided pressures.  She has a cardiac  index of 2.5 by Fick equation.  She had a V-wave of 46 mmHg and a wedge  of 30.  Pulmonary arterial pressure is 46/21.  The patient had no  significant coronary artery disease.  Her mitral valve area was  calculated 1.2 consistent with moderate mitral stenosis.  Dr. Riley Kill  did say that on e-gram, there was severe mitral regurgitation consistent  with the TEE.  The patient has also in the interim been diagnosed with  spinal stenosis after I referred her to the orthopedic surgeons.  She  now has a referral for Dr. Channing Mutters pending.  The patient has symptoms of  pseudoclaudication related to a spinal stenosis.  I gave Dr. Cornelius Moras a call  today explaining the somewhat complicated situation this patient is in  with her back problem.  We will coordinate in the next couple of weeks  what the plan will be as far as the timing of the surgery based on Dr.  Temple Pacini evaluation.  The patient continues to complain of significant  dyspnea on minimal exertion.  She is really in NYHA class III.   MEDICATIONS:  1. K-Dur 10 mg p.o. daily.  2. Metoprolol 12.5 mg p.o. b.i.d.  3. Humulin N 40 units b.i.d.  4. Coumadin as directed.  5. Synthroid 100 mcg p.o. daily.  6. Fish oil 1000 mg p.o. daily.  7. Lasix 40 mg a day.  8. Pacerone 200 mg a day.  9. Prevacid 20 mg daily.  10.Levaquin 250 mg a day.  11.Lidoderm patch.  12.Vicodin p.r.n.  13.Dilaudid prescription I gave last time and the patient states that      she does get pain relief with Dilaudid drug.   PHYSICAL EXAMINATION:  VITAL SIGNS:  Blood pressure 130/72, heart rate  69, weight 165 pounds.  NECK:  Normal carotid stroke with no carotid bruits.  No thyromegaly.  LUNGS:  Clear.  HEART:  Regular rate and rhythm with a 4/6 holosystolic murmur with  faint diastolic murmur.  There is no definite opening sound that I can  hear.  No definite S3.  ABDOMEN:  Soft, nontender.  No rebound or guarding.  Good bowel sounds.  EXTREMITIES:  No cyanosis, clubbing, or edema.   PROBLEM LIST:  1. Rheumatic heart.      a.     Status post mitral valve commissurotomy in 1993 at [facility].      b.     Mild to  moderate mitral stenosis and moderate to severe       mitral regurgitation with NYHA class III symptoms.      c.     Status post TEE.  See details regarding valve disease.      d.     Status post cardiac catheterization confirming the above       data with no significant coronary artery disease.  2. Paroxysmal atrial fibrillation.      a.     Status post CT-guided cardioversion October 2007.      b.     Chronic Coumadin therapy.      c.     Maintaining normal sinus rhythm on amiodarone.  3. Nonobstructive coronary artery disease by catheterization in      January 2009 and more recently couple of weeks ago.  4. Chronic diastolic heart failure.  5. Hypothyroidism.  6. Insulin-dependent diabetes mellitus.  7. Dyslipidemia.  8. Chronic obstructive pulmonary disease/tobacco use.  9. Gastroesophageal reflux disease.  10.Small bowel obstruction.  11.History of recurrent bilateral pleural effusions in the setting of      atrial fibrillation and mitral stenosis, resolved.  12.Severe spinal stenosis.   PLAN:  1. I called Dr. Cornelius Moras today and discussed with him the need to proceed      with a valve surgery on this patient.  Of note  that we also did a      treadmill test and the patient has very poor exercise tolerance.  2. We will forward the TEE images to Dr. Cornelius Moras.  3. I will also wait for a consultation with Dr. Trey Sailors regarding the      patient's spinal stenosis which will be an issue that we will have      to deal with perioperatively.  I am not sure if any noninvasive      procedures can be done to at least relieve the patient from her      pain syndrome, otherwise it may be difficult to rehab her after the      surgery.  We will coordinate this with Dr. Channing Mutters and see what the      best timing would be for her mitral valve surgery and maze      procedure.  4. I explained everything very carefully to the patient exhibits full      understanding and we will schedule the consultations.     Learta Codding, MD,FACC  Electronically Signed   GED/MedQ  DD: 05/06/2009  DT: 05/07/2009  Job #: [id number]   cc:   Lona Kettle Cornelius Moras, M.D.

## 2011-05-04 NOTE — Assessment & Plan Note (Signed)
[place] [facility]                          [name]   NAME:[name], [name]                  MRN:          [id number]  DATE:08/30/2007                            DOB:          1925/06/23    HISTORY OF PRESENT ILLNESS:  The patient is an 77 year old female with a  history of rheumatic valvular heart disease by recent echo with mild to  moderate mitral stenosis and moderate mitral regurgitation. The patient  has a prior history of mitral valve commissurotomy at [facility]. The patient has done quite well after and remains in  normal sinus rhythm post cardioversion. The patient remains in normal  sinus rhythm. She reports no palpitations or shortness of breath. She  reports no orthopnea, PND. She does report that over the last several  weeks she has noticed a left shoulder pain and left arm pain as well as  some neck pain which occurs at rest, not worsened by exertion and she  states that it does not really occur during the daytime. There is no  associated nausea or diaphoresis. The patient describes a numbness  radiating down the left arm. INR today in the office was 2.2.   The patient also was told by Dr.  Olena Leatherwood that her triglycerides were  markedly elevated. She was placed on Tricor. I also advised her to add  fish oil to her regimen.   MEDICATIONS:  1. Ambien 5 mg p.o. nightly.  2. Advair.  3. Digoxin 0.125 q daily.  4. Prilosec.  5. Lasix 40 mg p.o. daily.  6. K-Dur 10 mEq p.o. daily.  7. Metoprolol 25 mg one-half tablet b.i.d.  8. Biotin.  9. Synthroid 88 mcg p.o. daily.  10.Lorazepam 0.5 daily.  11.Tricor 48 mg p.o. daily.   PHYSICAL EXAMINATION:  VITAL SIGNS: Blood pressure is 100/50, heart rate  60 beats per minute. Weight 129 pounds.  NECK: Normal neck examination. No carotid bruits.  LUNGS:  Clear breath sounds bilaterally with no wheezing or crackles.  HEART: Regular rate and rhythm. Normal  S1, S2. No rubs or gallops, but  there is a soft holosystolic murmur at the apex. I cannot hear a  diastolic murmur. I also do not hear an opening snap.  ABDOMEN: Soft and nontender. No rebound or guarding. Good bowel sounds.  EXTREMITIES: No cyanosis, clubbing or edema.  NEURO: The patient is alert, oriented and grossly nonfocal.   PROBLEM LIST:  1. Atypical chest pain.      a.     Rule out coronary artery disease.  2. Paroxysmal atrial fibrillation (in normal sinus rhythm).      a.     Status post successful transesophageal guided cardioversion.  3. Coumadin therapy.  4. Rheumatic heart disease.      a.     Mild mitral stenosis.      b.     Moderate mitral regurgitation.      c.     Status post mitral valve commissurotomy at [facility].  5. Diabetes mellitus.  6. Hypertriglyceridemia.  7. Hypothyroidism.  PLAN:  1. The patient reports new onset left arm pain and shoulder pain with      some element of chest pain. The symptoms however vary and typically      not exertionally related. The patient does have cardiac risk      factors and therefore we will proceed with an adenosine Cardiolite      study to rule out ischemic heart disease. This will be done later      this week on Friday.  2. The patient will need to adhere to risk factor modification and has      already been initiated by Dr.  Olena Leatherwood with the addition of Tricor.      I also recommended to add fish oil to her medical regimen.  3. INR was measured in the office today and was at goal at 2.2. She      will be followed in the Coumadin clinic.  4. I have given the patient a prescription for p.r.n. nitroglycerine,      but I told her to only use it in extreme circumstances when she has      significant substernal chest pain and I also advised the patient to      lay down given her relatively low blood pressures.  5. The patient can followup with me in six months. She is scheduled to       undergo dental extraction and certainly her Coumadin can be stopped      five days prior to dental work. I do not think the patient needs      bridging with Lovenox as her acute risk for thromboembolic      complications is quite low.     Learta Codding, MD,FACC  Electronically Signed    GED/MedQ  DD: 08/30/2007  DT: 08/30/2007  Job #: [id number]   cc:   Lia Hopping

## 2011-05-04 NOTE — Cardiovascular Report (Signed)
NAMEMARVINA, DANNER           ACCOUNT NO.:  [id number]   MEDICAL RECORD NO.:  [id number]          PATIENT TYPE:  INP   LOCATION:  2005                         FACILITY:  [facility]   PHYSICIAN:  Everardo Beals. Juanda Chance, MD, FACCDATE OF BIRTH:  09-09-1925   DATE OF PROCEDURE:  01/02/2008  DATE OF DISCHARGE:                            CARDIAC CATHETERIZATION   CLINICAL HISTORY:  Mrs. Hegna is 77 years old and has rheumatic heart  disease and has had a previous mitral valve commissurotomy at [facility] and has paroxysmal atrial fibrillation and has been on  Coumadin.  She came to [facility] yesterday with chest pain and  today had recurrent chest pain with marked ST depression in her anterior  precordial leads as well as the limb leads.  Her initial troponin was  0.09.  Her pain was not relieved with morphine and beta blockers and  because of persistent pain and ST changes, she was transferred here for  further evaluation.  Her INR was 3.5.   DESCRIPTION OF PROCEDURE:  The procedure was performed via the right  femoral using arterial sheath and 6-French preformed coronary catheters.  A front wall arterial puncture and  Omnipaque contrast was used.  The  right femoral artery was closed with Angio-Seal at the end of the  procedure.  The patient tolerated the procedure well and left the  laboratory in satisfactory condition.   RESULTS:  The left main coronary artery was free of significant disease.   Left anterior descending artery gave rise to a large diagonal branch,  four septal perforators and small diagonal branch.  There was 70% ostial  stenosis in the first large diagonal branch.   The circumflex artery gave rise to a ramus branch, a small marginal  branch and two posterolateral branches.  There was 40% ostial stenosis  in the circumflex artery.   The right coronary artery was a moderately large vessel that gave rise  to a right ventricle branch, posterior descending  branch, two small and  one large posterolateral branches.  These vessels were free of  significant disease.   The left ventriculogram performed in the RAO projection showed good wall  motion with no areas of hypokinesis.  The estimated ejection fraction  was 60%.   CONCLUSION:  Minimal nonobstructive coronary artery disease with 70%  ostial stenosis in the diagonal branch of the left anterior descending  coronary artery, 40% ostial narrowing in the circumflex artery, no  significant obstruction of the right artery coronary and normal left  ventricular function.   RECOMMENDATIONS:  The patient has only mild nonobstructive coronary  artery disease and normal LV function.  The etiology of her chest pain  and abnormal ECG is not clear.  Pulmonary embolism is a possibility  despite her therapeutic INR.  Will plan to do a CT angio of the chest  tomorrow.  Will treat her with aspirin and hold her Coumadin for 1 day  pending the results of her CT angio.      Bruce Elvera Lennox Juanda Chance, MD, [facility]  Electronically Signed     BRB/MEDQ  D:  01/02/2008  T:  01/03/2008  Job:  [id number]   cc:   Learta Codding, MD,FACC  Everardo Beals Juanda Chance, MD, [facility]  Cardiopulmonary Lab

## 2011-05-04 NOTE — Assessment & Plan Note (Signed)
OFFICE VISIT   [name], [name]  DOB:  Sep 17, 1925                                        February 09, 2010  CHART #:  [id number]   HISTORY:  The patient returns for followup now more than 6 months status  post right mini thoracotomy for mitral valve replacement using  bioprosthetic tissue valve and Cox CryoMaze procedure with closure of  patent foramen ovale.  She was last seen here in our office on July 21, 2009.  Since then, she reports that overall she has done fairly well.  She still has problems with severe degenerative arthritis.  She had her  left knee injected by Dr. Olena Leatherwood, and since that time she has done  better in terms of getting around on her legs and walking.  However, she  states that she is still somewhat unsteady on her feet because of her  severe arthritis, and she is prone to falling.  She has not had any  severe falls and she has not had any problems with respect to this due  to the fact that she remains on Coumadin.  She states that her breathing  is quite good and she does not have any further significant shortness of  breath.  Her appetite is normal.  She has no other complaints.  She is  not aware of having any tachy palpitations.  The remainder of her review  of systems is unrevealing.   CURRENT MEDICATIONS:  Metoprolol, amiodarone, Lasix, Synthroid,  Coumadin, potassium, Humulin insulin, Prevacid, fish oil capsule, and  lorazepam.   PHYSICAL EXAMINATION:  Notable for well-appearing obese female who  appears her stated age.  Blood pressure 138/70, pulse is 70 and regular.  Two-channel telemetry rhythm strip is a bit difficult to interpret.  The  rhythm is regular clearly and appears to be sinus rhythm, although the P-  waves appears somewhat abnormal.  It is unclear if this could represent  an atrial flutter rhythm that is regular versus sinus rhythm with  unusual P-waves.  Formal 12-lead electrocardiogram is not  practical or  available here in our office.  Examination of the chest reveals clear  breath sounds which are symmetrical.  No wheezes or rhonchi are noted.  The miniature thoracotomy incision has healed nicely.  Cardiovascular  exam is notable for regular rate and rhythm.  No murmurs, rubs, or  gallops are noted.  The abdomen is soft, nontender.  The extremities are  warm and well perfused.  There is mild bilateral lower extremity edema.   IMPRESSION:  The patient looks quite good, now over 6 months status post  mitral valve replacement using a bioprosthetic tissue valve and Cox  CryoMaze procedure via right mini thoracotomy approach.  She seems to be  doing well, although her primary limitation relates to her severe  arthritis.  Because of this, she has a tendency to stumble and fall, in  long term, this could potentially pose problems given the fact that she  remains anticoagulated on Coumadin.  Her rhythm here in the office today  is regular.  I cannot tell for certain whether or not this represents  sinus rhythm or an ectopic atrial rhythm or atrial flutter.  The P-waves  are undoubtedly abnormal on her two-channel telemetry rhythm strip.   PLAN:  We have not made any  recommendations for any changes in her  current medications.  At some point, it might be reasonable to consider  stopping amiodarone.  She states that she is supposed to be seeing Dr.  Andee Lineman within the next month or two for routine follow up in the office.  Formal 12-lead electrocardiogram and/or CardioNet monitoring might be  indicated.  We will plan to see the patient back in 6 months' time.   Salvatore Decent. Cornelius Moras, M.D.  Electronically Signed   CHO/MEDQ  D:  02/09/2010  T:  02/09/2010  Job:  [id number]   cc:   Learta Codding, MD,FACC  Lia Hopping

## 2011-05-04 NOTE — H&P (Signed)
HISTORY AND PHYSICAL EXAMINATION   May 12, 2009   Re:  [name], [name]           DOB:  May 21, 1925   DATE OF PLANNED HOSPITAL ADMISSION AND SURGERY:  May 22, 2009.   PRESENTING CHIEF COMPLAINT:  Exertional shortness of breath.   HISTORY OF PRESENT ILLNESS:  The patient is an 77 year old widowed white  female from [place], [place], with known history of rheumatic mitral  valve disease, congestive heart failure, and atrial fibrillation.  The  patient was first diagnosed with significant mitral stenosis nearly 20  years ago, and she underwent balloon valvuloplasty in 1993 at [facility].  The patient also developed persistent atrial  fibrillation for which she underwent DC cardioversion on two previous  occasions, originally at [facility] and more  recently in October 2007 at [facility] in [place].  For the last  several years, she has been followed by Dr. Andee Lineman and colleagues at the  [facility] Cardiology office in [place].  In March, she was hospitalized  briefly at [facility] with atypical chest pain and shortness of  breath.  She was seen in followup after this visit by Dr. Andee Lineman, and  she subsequently underwent a stress test and transesophageal  echocardiogram.  Nuclear stress test revealed no sign of significant  myocardial ischemia, but transesophageal echocardiogram revealed severe  mitral regurgitation with mild-to-moderate mitral stenosis.  Left  ventricular systolic function remains normal.  The patient subsequently  underwent elective left and right heart catheterization by Dr. Riley Kill  on Apr 30, 2009.  This documented the presence of moderate-to-severe  mitral stenosis with severe mitral regurgitation, pulmonary  hypertension, normal left ventricular systolic function.  There was no  significant coronary artery disease.  The patient has now been referred  for possible elective surgical  intervention.   REVIEW OF SYSTEMS:  General:  The patient reports normal appetite.  She  has not been gaining or losing weight recently.  She is 5 feet 3 inches  tall and weighs approximately 162 pounds.  Cardiac:  The patient  describes a 39-month history of progressive exertional shortness of  breath.  The patient sleeps on 2 pillows at night.  She has had  worsening bilateral lower extremity edema.  She denies any resting  shortness of breath.  She has had some atypical chest pain that comes  and goes and seems to be worse when she is short of breath.  She has had  some occasional palpitations.  Respiratory:  Notable only for shortness  of breath.  The patient denies productive cough, hemoptysis, wheezing.  Gastrointestinal:  Notable for some chronic constipation which is  stable.  The patient has no difficulty swallowing.  She denies  hematochezia, hematemesis, melena.  Genitourinary:  Negative.  Peripheral vascular:  Notable that the patient does have pain in both  legs that begins at the hip and extends all way down on both sides with  ambulation and standing.  This has been attributed to her degenerative  disk disease and spinal stenosis.  Neurologic:  Notable for the absence  of transient monocular blindness or transient numbness or weakness.  The  patient does have chronic pain afflicting both hips and legs as  described, worse on the right than the left.  The patient has chronic  back pain that is significant, made worse by lying flat in bed.  Psychiatric:  Notable for some nervousness and anxiety.  HEENT:  Negative.  The patient has no ongoing dental problems.  It has been  approximately 1 year since her last dental visit and cleaning.  She has  one tooth that is slightly sensitive, but no loose teeth or problems  suggestive of ongoing dental infections.   PAST MEDICAL HISTORY:  1. 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  2. Congestive heart failure, chronic diastolic.  3. Persistent atrial fibrillation, status post DC cardioversion x2,      maintaining normal sinus rhythm, on amiodarone.  4. Type 2 diabetes mellitus, insulin dependent.  5. Hypothyroidism.  6. Spinal stenosis with severe degenerative disk disease of the      lumbosacral spine.  7. Hypothyroidism.  8. Hyperlipidemia.  9. Chronic obstructive pulmonary disease.  10.Remote tobacco use.  11.GE reflux disease.  12.Remote history of small bowel obstruction.   PAST SURGICAL HISTORY:  1. Balloon valvuloplasty, mitral valve 1993.  2. Cholecystectomy.  3. Appendectomy.  4. Hysterectomy.  5. Back surgery.  6. Left foot surgery.  7. Tonsillectomy.  8. Exploratory laparotomy for small-bowel obstruction.   FAMILY HISTORY:  Noncontributory.   SOCIAL HISTORY:  The patient is widowed and lives alone in [place].  She has 2 grown children, one of whom is her son who accompanies her to  the office today.  She remains functionally independent, drives an  automobile and cares for herself at home, but she has had to slow down  considerably over the last several months due to progressive shortness  of breath as well as chronic back pain and lower leg pain.  The patient  has a remote history of tobacco use, but she quit smoking in 1990.  She  denies alcohol consumption.   CURRENT MEDICATIONS:  1. Potassium chloride 10 mEq daily.  2. Metoprolol 12.5 mg twice daily.  3. Humulin NPH insulin 40 units twice daily.  4. Coumadin as directed.  5. Synthroid 100 mcg daily.  6. Fish oil 1000 mg daily.  7. Lasix 80 mg daily.  8. Amiodarone 200 mg daily.  9. Prevacid 20 mg daily.  10.Lidoderm patch.  11.Vicodin as needed.  12.Lyrica 50 mg daily.   DRUG ALLERGIES:  Aspirin causes GI upset and sulfa causes pneumonia.   PHYSICAL EXAMINATION:  GENERAL:  The patient is a well-appearing elderly  female, who appears his stated  age in no acute distress.  VITAL SIGNS:  Blood pressure 122/64, pulse 64, and oxygen saturation 96%  on room air.  HEENT:  Grossly unrevealing.  NECK:  Supple.  There is no jugular venous distention.  There are no  carotid bruits.  LUNGS:  Auscultation of the chest reveals clear breath sounds which are  symmetrical bilaterally.  No wheezes or rhonchi are noted.  CARDIOVASCULAR:  Regular rate and rhythm.  There is a grade 2-3/6  systolic murmur heard best at the apex with radiation to the axilla.  They are sounds like an opening snap.  No diastolic murmurs are noted.  ABDOMEN:  Soft and nontender.  EXTREMITIES:  Warm and well perfused.  There is mild bilateral lower  extremity edema, left greater than right.  Femoral pulses are thready  and somewhat diminished bilaterally.  Distal pulses are thready and  perhaps palpable only in the dorsalis pedis position.  RECTAL/GU:  Both deferred.  SKIN:  Clean, dry, and healthy-appearing throughout.  NEUROLOGIC:  Grossly nonfocal.   DIAGNOSTIC TEST:  Transesophageal echocardiogram performed by  Dr. Andee Lineman  on April 03, 2009, at [facility] in [place] is reviewed.  There is  normal left ventricular size and function.  The mitral valve leaflets  are moderately thickened with mild calcification in both systolic and  diastolic restriction of leaflet mobility.  There is severe mitral  regurgitation.  There is moderate mitral stenosis.  Findings were  consistent with underlying rheumatic mitral valve disease.  The aortic  valve is normal.  There is no aortic insufficiency.  There is trace  tricuspid regurgitation.  There are no other significant abnormalities  noted.   Left and right heart catheterization performed by Dr. Riley Kill on Apr 30, 2009, is reviewed.  This demonstrates no significant coronary artery  disease with normal coronary artery anatomy.  There is severe mitral  regurgitation.  Pulmonary artery pressure is measured 46/21 with   pulmonary capillary wedge pressure of 30 and a large V-wave 46 mmHg.  Mitral valve area using thermodilution technique was 1.0 cm2.  Central  venous pressure was 10.   IMPRESSION:  Rheumatic mitral valve disease with moderate-to-severe  mitral stenosis and severe mitral regurgitation and worsening symptoms  of congestive heart failure, functional class III.  The patient has  history of persistent atrial fibrillation requiring DC cardioversion on  two previous occasions.  She has been maintaining sinus rhythm on  amiodarone.  Despite her advanced age, I believe that the patient is a  reasonable candidate for elective surgical intervention for treatment of  her underlying mitral valve disease and atrial fibrillation.  Mitral  valve will almost certainly require replacement, and based upon her age,  I would favor using a bioprosthetic tissue valve.  She does have severe  spinal stenosis and is quite symptomatic.  This may significantly impact  her postoperative recovery.  In addition, she is a widow and lives  alone, and although she has family support, she will likely need at  least temporary placement in some type of nursing facility or rehab  facility during her postoperative convalescence.   PLAN:  I have discussed options at length with the patient and her son  here in the office today.  Alternative treatment strategies have been  discussed.  She is scheduled to see Dr. Trey Sailors later this week for  further evaluation of her severe spinal stenosis.  However, I suspect  that she will need to have her heart valve problems taking care prior to  proceeding with any sort of elective spinal surgery.  We will  tentatively plan to proceed with surgery on Thursday, May 22, 2009.  We  will obtain CT angiogram of the thoracic and abdominal aorta to evaluate  the presence of possible significant peripheral vascular disease based  upon her physical exam here in the office today.  Assuming that no   significant flow-limiting lesions are identified, we can consider  minimally invasive approach with femoral artery cannulation for surgery.  I have outlined the indications, risks, and potential benefits of  surgery at length.  The patient and her son understand and accept all  potential associated risks of surgery including but not limited to risk  of death, stroke, myocardial infarction, congestive heart failure,  respiratory failure, pneumonia, bleeding requiring blood transfusion,  arrhythmia, heart block or bradycardia requiring permanent pacemaker  placement.  They understand the rationale for use of a bioprosthetic  tissue valve for valve replacement, and they understand that there is a  small but significant risk of late structural valve deterioration and  failure.  However, based upon her age in the likelihood of valve failure  requiring repeat surgical intervention would be low during her lifetime.  We will plan to proceed  with surgery on Thursday, May 22, 2009.  She has been instructed to stop  taking Coumadin after her dose on Thursday, May 15, 2009, in  anticipation for surgery.   Salvatore Decent. Cornelius Moras, M.D.  Electronically Signed   CHO/MEDQ  D:  05/12/2009  T:  05/13/2009  Job:  [id number]   cc:   Learta Codding, MD,FACC  Donn Pierini, M.D.

## 2011-05-04 NOTE — Discharge Summary (Signed)
[name], [name]           ACCOUNT NO.:  [id number]   MEDICAL RECORD NO.:  [id number]          PATIENT TYPE:  INP   LOCATION:  2001                         FACILITY:  [facility]   PHYSICIAN:  Salvatore Decent. Cornelius Moras, M.D. DATE OF BIRTH:  April 04, 1925   DATE OF ADMISSION:  05/23/2009  DATE OF DISCHARGE:                               DISCHARGE SUMMARY   FINAL DIAGNOSES:  1. Severe mitral regurgitation.  2. Mild mitral stenosis.  3. Persistent atrial fibrillation, status post direct current      cardioversion.   IN-HOSPITAL DIAGNOSES:  1. Volume overload postoperatively.  2. Acute blood loss anemia postoperatively.  3. Postoperative bronchitis.  4. Postoperative thrombocytopenia.   SECONDARY DIAGNOSES:  1. Congestive heart failure, chronic, diastolic.  2. Type 2 diabetes mellitus, insulin dependent.  3. Hypothyroidism.  4. Spinal stenosis with severe degenerative disk disease of the      lumbosacral spine.  5. Hyperlipidemia.  6. Chronic obstructive pulmonary disease.  7. Remote tobacco use.  8. Gastroesophageal reflux disease.  9. Remote history of small bowel obstruction.  10.Status post balloon valvuloplasty, mitral valve, 1993.  11.Status post cholecystectomy.  12.Status post appendectomy.  13.Status post hysterectomy.  14.Status post back surgery.  15.Status post left foot surgery.  16.Status post tonsillectomy.  17.Status post exploratory laparotomy for small bowel obstruction.   IN-HOSPITAL OPERATIONS/PROCEDURES:  1. Right thoracotomy for mitral valve replacement using a 27-mm      Medtronic Mosaic porcine tissue valve.  2. Cox CryoMaze procedure with closure of patent foramen ovale.  3. Intraoperative transesophageal echocardiogram.   HISTORY AND PHYSICAL AND HOSPITAL COURSE:  Patient is an 77 year old  female with longstanding history of rheumatic heart disease with  congestive heart failure and atrial fibrillation.  Patient underwent  balloon valvuloplasty for  mitral stenosis in [facility] at [name]  Patient developed persistent atrial fibrillation for  which she underwent DC cardioversion on 2 previous occasions.  Recently,  patient has presented with progressive symptoms of congestive heart  failure.  Transesophageal echocardiogram done revealed mild to moderate  mitral stenosis and severe mitral regurgitation.  There is normal left  ventricular function.  Cardiac catheterization done demonstrates no  significant coronary artery disease.  Dr. Cornelius Moras was consulted.  Dr. Cornelius Moras  saw and evaluated patient.  He discussed with patient undergoing mitral  valve repair/replacement.  He also discussed with patient undergoing  MAZE procedure.  Risks and benefits were discussed with patient.  Patient noted her understanding and agreed to proceed.  For further  details of patient's past medical history and physical exam, please see  dictated H and P.   Patient was taken to the operating room, May 23, 2009, where she  underwent right thoracotomy for mitral valve replacement using a 27-mm  Medtronic Mosaic porcine tissue valve.  She also had Cox CryoMaze  procedure and closure of patent foramen ovale.  Patient tolerated this  procedure and was transferred to the intensive care unit in stable  condition.  Postoperatively, patient was noted to be hemodynamically  stable.  She was extubated early morning following surgery.  Post  extubation, patient  noted to be alert and oriented x4 and neuro intact.  Postoperatively, patient was noted to develop acute blood loss anemia  with hemoglobin at 7.0.  She received 2 units of packed red blood cells.  Patient's hemoglobin and hematocrit continued to be followed closely.  This did improve and remain stable.  Patient required no further  transfusions.  Postoperatively, patient was noted to be in junctional  rhythm.  Blood pressure was stable.  All drips were weaned and  discontinued.  Patient was  started on amiodarone, as well as Coumadin.  Heart rate was continued to be monitored.  Patient remained in  junctional for most of her postoperative course.  She did have several  runs of normal sinus rhythm back into junctional.  Beta-blocker was  held.  Patient currently is in junctional rhythm in the 90s.  Daily  PT/INRs were obtained for Coumadin dosing.  Coumadin was adjusted  appropriately.  Postoperatively, chest x-ray done postop day 1 showed  airspace disease in the right lung.  Patient was felt to have  significant volume overload and she was started on diuretics.  Her  mediastinal chest tube was discontinued postop day 1.  Her pleural chest  tube continued to be monitored and record drainage.  Repeat chest x-rays  showed mild pulmonary edema.  She remained on the Lasix drip.  Patient's  chest tube drainage decreased and by postop day 3 pleural chest tube was  able to be discontinued.  Chest x-ray remained stable with no change.  Lasix drip was switched to Lasix p.o.  She was encouraged to use her  incentive spirometer.  She was attempted to be weaned off oxygen but  currently remains on 2 L at 93%.  On May 29, 2009, patient noted to be  complaining of cough, nonproductive.  Noted to have rales on exam.  Recent chest x-ray was actually showing improved aeration.  Her  diuretics, she was given an extra dose and started on empiric  antibiotics for probable bronchitis.  We will continue to monitor  patient's symptoms.  Again, we will continue to try and wean off oxygen  to maintain saturations greater than 90%.  Daily weights were followed  closely and she was 2 kg above her preoperative weight on June 10. 2010.  Her peripheral edema had improved slowly.  Postoperatively, patient did  have some mild thrombocytopenia.  Her platelet count was followed  closely.  It did stabilize and started to increase back to normal prior  to discharge home.   Patient was felt stable and ready  for transfer out to 2000 postop day 3.  During patient's hospitalization, cardiac rehab was working with  patient.  She was up ambulating with assistance.  Physical Therapy was  consulted for further evaluation.  Physical Therapy felt that patient  would require skilled nursing facility secondary to no one at home with  patient and her assistance needed with ambulation.  Case management was  consulted and providing workup for skilled nursing facility.  Placement  choices were given to patient and she chose [facility].  Currently, they have a bed available for patient when ready  for discharge  Postoperatively, patient was tolerating diet well.  No  nausea or vomiting noted.  All incisions were clean, dry, and intact and  healing well.   May 29, 2009, patient's vital signs noted to be stable.  She is  afebrile.   MOST RECENT LAB WORK:  Shows an INR of  1.5, sodium of 130, potassium of  3.7, chloride of 99, bicarbonate 31, BUN of 18, creatinine 0.87, glucose  of 106.  White blood cell count 7.7, hemoglobin 9.9, hematocrit 29.4,  platelet count of 89.  Most recent chest x-ray shows small bilateral  pleural effusions.  There is interstitial edema and cardiomegaly which  is stable.  Patient is tentatively ready for transfer to skilled nursing  facility in the next 24 hours pending patient remained stable.   FOLLOWUP APPOINTMENTS:  A followup appointment has been arranged with  Dr. Cornelius Moras for June 09, 2009, at 1:30 p.m.  Patient will need to obtain PA  and lateral chest x-ray 30 minutes prior to this appointment.  Patient  will need to follow up with Dr. Andee Lineman in 2 weeks.  She will need to  contact his office to make these arrangements.  Patient will need to  have PT/INR blood work done June 02, 2009, at [name] and results to be faxed to [facility].   ACTIVITY:  Patient instructed no driving until released to do so, no  heavy  lifting over 10 pounds.  She is told to ambulate 3 to 4 times per  day, progress as tolerated, and to continue her breathing exercises.   INCISIONAL CARE:  Patient is told to shower washing her incisions using  soap and water.  She is to contact the office if she develops any  drainage or opening from any of her incision sites.   DIET:  Patient's diet to be low fat, low salt, as well as diabetic diet.   DISCHARGE MEDICATIONS:  1. Humulin N 40 units b.i.d.  2. Synthroid 100 mcg daily.  3. Fish oil 1000 mg daily.  4. Amiodarone 200 mg daily.  5. Prevacid 20 mg daily.  6. Lidoderm patch p.r.n.  7. Vicodin 5/500 one to 2 tabs every 4 to 6 hours p.r.n. pain.  8. Lyrica 50 mg daily.  9. Coumadin will be dosed based on patient's discharge PT/INR level.  10.Lasix 40 mg b.i.d.  11.Potassium chloride 40 mEq daily.  12.Guaifenesin 1200 mg b.i.d.  13.Avelox 400 mg daily, length will be decided on time of discharge.      Sol Blazing, PA      Salvatore Decent. Cornelius Moras, M.D.  Electronically Signed    KMD/MEDQ  D:  05/29/2009  T:  05/29/2009  Job:  [id number]   cc:   Salvatore Decent. Cornelius Moras, M.D.  Learta Codding, MD,FACC  Dr. Channing Mutters  Dr. _____

## 2011-05-04 NOTE — Assessment & Plan Note (Signed)
[facility]                          [name]   [name], [name]                  MRN:          [id number]  DATE:03/17/2009                            DOB:          [birth year]    REFERRING PHYSICIAN:  Lia Hopping   HISTORY OF PRESENT ILLNESS:  The patient is a very pleasant elderly  female (77 year old) with a history of mitral stenosis and mitral  regurgitation.  She is status post prior valvuloplasty.  She also has a  history of atrial fibrillation and flutter and is status post successful  cardioversion several years ago, at which time she presented with heart  failure.  She has nonobstructive coronary artery disease by  catheterization in January 2009 with normal LV function.  The patient  was recently hospitalized to [facility] with chest pain to ruled  out for myocardial infarction.  She remained in normal sinus rhythm.  A  clinical diagnosis of congestive heart failure was made, but this was  unlikely given her BNP of 198.  Cardiology was not consulted during this  admission.   The patient reports in the office that she is markedly short of breath  over the last 6 months.  She states that just even washing her dishes,  it makes her very short of breath and she has to sit down.  She can  really walk short distance before she becomes very dyspneic.  She denies  having any chest pain.  She has no orthopnea, PND, palpitations, or  syncope.   MEDICATIONS:  1. K-Dur 10 mg p.o. daily.  2. Metoprolol 25 mg half tablet p.o. b.i.d.  3. Humulin insulin 40 units b.i.d.  4. Coumadin as directed.  5. Synthroid 100 mcg p.o. daily.  6. Fish oil 1000 mg p.o. daily.  7. Lasix 20 mg p.o. daily.  8. Pacerone 200 mg p.o. daily.  9. Prevacid 20 mg p.o. daily.   PHYSICAL EXAMINATION:  VITAL SIGNS:  Blood pressure is 113/67, heart  rate 59, respirations 20, weight 163 pounds, up from 156 during the last  visit.  GENERAL:   An 77 year old female in no distress.  HEENT:  Pupils, eyes are equal.  Conjunctivae are clear.  NECK:  Supple.  Normal carotid upstroke.  No carotid bruits.  JVD is  approximately 7 cm.  LUNGS:  Clear to auscultation with no wheezing.  HEART:  Regular rate and rhythm.  There is a soft holosystolic murmur at  the apex.  There is a opening snap, but the timing is difficult to  determine.  ABDOMEN:  Soft and nontender.  EXTREMITIES:  Trace pitting edema.  NEUROLOGIC:  The patient is alert, oriented, and grossly nonfocal.  She  does report sciatic pain in the right leg.   PROBLEMS:  1. History of atypical chest pain.      a.     Nonobstructive coronary artery disease by catheterization in       January 2009.      b.     Recent hospitalization at [facility] with negative troponin.  2. Normal left ventricular function.  3. Dyspnea x6 months.  a.     Likely secondary to valvular heart disease.      b.     Moderate mitral stenosis, moderate mitral regurgitation       without recent evaluation.  4. Chronic Coumadin therapy.  5. History of atrial fibrillation/flutter.      a.     Status post successful cardioversion.      b.     Maintaining normal sinus rhythm.  6. Insulin-dependent diabetes mellitus.  7. Dyslipidemia.  8. Gastroesophageal reflux disease.  9. History of volume overload.  10.Treated hypothyroidism.   PLAN:  1. I will not make any changes in the patient's diuretic regimen.  Her      weight is indeed up, but I do not think this is volume overload,      the BNP of 198 recently is consistent with his.  2. The patient's dyspnea is likely secondary to worsening valvular      heart disease, now suspect the patient has now developed      significant mitral stenosis and/or mitral regurgitation.  3. The patient will be scheduled for TEE as well as an exercise      treadmill test.  I suspect that the patient is NYHA class II-III.      If she has significant mitral stenosis and  or mitral regurgitation,      likely need valvular replacement.  4. The patient will follow up and we will make final decisions and      pending results of the above tests.     Learta Codding, MD,FACC  Electronically Signed    GED/MedQ  DD: 03/17/2009  DT: 03/17/2009  Job #: [id number]   cc:   Lia Hopping

## 2011-05-07 NOTE — Assessment & Plan Note (Signed)
[place] [facility]                          [name]   [name], [name]                  MRN:          [id number]  DATE:02/15/2007                            DOB:          August 26, 1925    HISTORY OF PRESENT ILLNESS:  The patient is a 77 year old female with a  history of rheumatic valvular heart disease with moderate mitral  stenosis and moderately severe mitral regurgitation.  The patient has a  prior history of a rebound mitral valve commissurotomy at [facility].  She was last year seen in the hospital due  to recurrent pleural effusions with evidence of congestive heart  failure.  She was also found to be in atrial fibrillation.  At that  time, we proceeded with a TEE cardioversion.  The patient with  significant spontaneous diuresis that improved dramatically thereafter.  The patient now presents for follow up.  She continues to do extremely  well.  She is NYHA class II.  She has no shortness of breath on  exertion.  Denies any chest pain.  She denies any lower extremity edema.  Reportedly, she did state that she had some hair loss, and a TSH was  checked which was elevated.  Synthroid was increased from 25 mcg to 175  mcg daily, and the patient was also give Biotin per Dr. Olena Leatherwood.   MEDICATIONS:  1. Ambien 5 mg p.o. q.h.s.  2. Ativan p.r.n.  3. Digoxin 0.125 daily.  4. Prilosec.  5. Lasix 40 mg p.o. daily.  6. Potassium 10 mEq p.o. daily.  7. Lortab 5/500.  8. Insulin.  9. Metoprolol 25 mg half a tablet p.o. b.i.d.  10.Synthroid 175 mcg p.o. daily.  11.Biotin 300 mcg p.o. daily.   PHYSICAL EXAMINATION:  VITAL SIGNS:  Blood pressure is 107/56, heart  rate 70 beats per minute, weight is 130 pounds.  NECK:  No carotid upstroke and no carotid bruits.  LUNGS:  Clear breath sounds bilaterally.  HEART:  Regular rate and rhythm.  Normal S1 and S2.  No murmurs, rubs,  or gallops.  ABDOMEN:  Soft and  nontender with no rebound or guarding.  Good bowel  sounds.  HEART:  Regular rate and rhythm with normal S1 and S2, and a 2/6  holosystolic murmur at the apex.  EXTREMITIES:  No edema.   PROBLEM LIST:  1. History of paroxysmal atrial fibrillation.      a.     Status post successful TEE-guided cardioversion.  2. Normal sinus rhythm with chronic Coumadin therapy.  INR followed in      our office.  3. Rheumatic heart disease.      a.     Mild mitral stenosis.      b.     Moderately severe mitral regurgitation.      c.     Status post mitral valve commissurotomy at [facility].  4. Diabetes mellitus.  5. Hypothyroidism.   PLAN:  1. I have asked the patient to cut her Synthroid in half, given the      fact  that this is a dramatic increase from her prior maintenance      dose.  Particularly, I am concerned that the patient could revert      back to atrial fibrillation on the dramatically higher dose of      Synthroid.  2. The patient will follow up in 4 weeks, and will recheck a TSH at      that time.  3. The patient will also have a 2-D echocardiogram study to reevaluate      her mitral valve and left ventricular function.  4. The patient will see [place] back in 4 weeks.     Learta Codding, MD,FACC  Electronically Signed    GED/MedQ  DD: 02/15/2007  DT: 02/15/2007  Job #: [id number]   cc:   Lia Hopping

## 2011-05-07 NOTE — Assessment & Plan Note (Signed)
[place] [facility]                          [name]   WILLODENE, STALLINGS                  MRN:          [id number]  DATE:03/17/2007                            DOB:          15-Dec-1925    HISTORY OF PRESENT ILLNESS:  The patient is a 77 year old female with  history of rheumatic valvular heart disease with moderate mitral  stenosis and moderate mitral regurgitation.  The patient was seen in the  office on February 15, 2007.  She was doing quite well.  She remained in  normal sinus rhythm.  She is an NYHA class I to II.  She denies any  orthopnea or PND.  She remains on chronic anticoagulation.  We did make  some adjustment to the patient's Synthroid dosing at that time.  The  patient also had a repeat echographic study, which showed moderate  mitral regurgitation and mild mitral stenosis.  The mitral valve appears  to be stable by echocardiography.   INR today in the office was 1.8.   MEDICATIONS:  1. Ambien 5 mg p.o. nightly.  2. Advair 0.5 mg t.i.d. p.r.n.  3. Digoxin 125 mcg p.o. daily.  4. Prilosec OTC.  5. Lasix 40 mg p.o. daily.  6. K-Dur 10 mg p.o. daily.  7. Lortab 5/500 mg q.4 p.r.n. severe pain.  8. Insulin.  9. Metoprolol 25 mg half a tablet b.i.d.  10.Biotin.  11.Synthroid 87.5 mcg p.o. daily.   PHYSICAL EXAMINATION:  VITAL SIGNS:  Blood pressure is 99/55, heart rate  73, weight 126 pounds.  NECK:  Normal carotid upstroke.  No carotid bruits.  LUNGS:  Clear breath sounds bilaterally.  HEART:  Regular rate and rhythm with normal S1, S2.  There is a soft  systolic murmur at the apex.  The PMI is not displaced.  ABDOMEN:  Soft.  EXTREMITIES:  No cyanosis, clubbing, or edema.  NEURO:  The patient is alert, oriented, and grossly nonfocal.   PROBLEM LIST:  1. Paroxysmal atrial fibrillation [in normal sinus rhythm].      a.     Status post successful transesophageal guided cardioversion.  2. Normal sinus rhythm  with chronic Coumadin therapy.  3. Rheumatic heart disease.      a.     Mild mitral stenosis.      b.     Moderate mitral regurgitation.      c.     Status post mitral valve commissurotomy at [facility].  4. Diabetes mellitus.  5. Hypothyroidism.   PLAN:  1. The patient is doing quite well.  She is an New York Heart      Association class I to II.  She is essentially asymptomatic.  2. The patient remains in normal sinus rhythm and I have made no      change in her medical regimen.  3. The patient can follow up in our office in 6 months.     Learta Codding, MD,FACC  Electronically Signed    GED/MedQ  DD: 03/17/2007  DT: 03/17/2007  Job #: [id number]

## 2011-05-07 NOTE — Assessment & Plan Note (Signed)
[facility]                          [name]   [name], [name]                  MRN:          [id number]  DATE:12/11/3008                            DOB:          [birth year]    PRIMARY CARDIOLOGIST:  [name] Codding, MD, [facility]   REASON FOR VISIT:  Scheduled followup.   [name] returns to our clinic with a scheduled followup, reporting  no significant change from her baseline level of exertional dyspnea.  Although, she has gained 11 pounds since her last visit, she attributes  this to increased caloric intake.  She denies any PND, orthopnea, or  worsening lower extremity edema.  She also notes some mild, anterior  chest pressure when laying in bed at night, but denies any exertional  discomfort.   With respect to her medications, she has cut back her Pacerone to the  current dose of 200 mg daily, as recently recommended by Dr. Andee [name].   The patient does refer to having reflux disease, and is currently on  Zantac.   CURRENT MEDICATIONS:  1. Pacerone 200 mg daily.  2. Lasix 40 mg daily.  3. Ranitidine 300 mg b.i.d.  4. Fish oil 1000 mg daily.  5. Synthroid 0.1 mg daily.  6. Coumadin 5 mg, as directed.  7. Humulin insulin 40 units b.i.d.  8. Metoprolol tartrate 12.5 mg b.i.d.  9. K-Dur 10 mEq daily.   PHYSICAL EXAMINATION:  VITAL SIGNS:  Blood pressure 128/62, pulse 74,  and regular weight 156 (up 11).  GENERAL:  An 77 year old female, sitting upright, no distress.  HEENT:  Normocephalic and atraumatic.  NECK:  Palpable bilateral carotid pulses without bruits; no JVD.  LUNGS:  Clear to auscultation in all fields.  HEART:  Regular rate and rhythm.  No significant murmurs.  No rubs.  ABDOMEN:  Benign.  EXTREMITIES:  Trace pedal edema.  NEUROLOGY:  Alert and oriented.   IMPRESSION:  1. Atypical chest pain.      a.     Nonobstructive CAD by cardiac catheterization, January 2009.  2. Normal LVF.  3. Valvular  heart disease.      a.     Moderate mitral stenosis/moderate regurgitation.  4. Chronic Coumadin.  5. History of atrial fibrillation/flutter.      a.     Status post successful DCCV.  6. Insulin-dependent diabetes mellitus  7. Dyslipidemia.  8. Gastroesophageal reflux disease.  9. Volume overload.  10.Treated hypothyroidism.   PLAN:  1. Up titrate Lasix to 40 mg b.i.d. x5 days, then resume maintenance      level of 40 mg daily.  I suspect the patient's chest pressure that      she feels when lying supine at night may be due to recent      development of mild volume overload.  We will also double her      potassium dose during the same time frame.  We will check a      followup BMET in 1 week.  2. Add omeprazole 20 mg daily to her regimen, in the event that this  supine chest discomfort is, in fact, due to breakthrough nocturnal      reflux.  3. Schedule return clinic followup with myself and Dr. Andee [name] in 4      months.      [name] Serpe, PA-C  Electronically Signed      [name] Codding, MD,FACC  Electronically Signed   GS/MedQ  DD: 12/11/2008  DT: 12/12/2008  Job #: [id number]   cc:   [name]

## 2011-05-07 NOTE — Assessment & Plan Note (Signed)
[facility]                          [name]   [name], [name]                  MRN:          [id number]  DATE:11/21/2006                            DOB:          11/01/1925    PRIMARY CARDIOLOGIST:  [name], [facility].   REASON FOR VISIT:  Post hospital followup.   [name] is a 77 year old female with a complex past medical  history, who was initially seen by [place] in consultation here at [facility]  following hospitalization for recurrent heart failure. She had been  hospitalized on two previous occasions at [facility]  and was  treated with multiple thoracenteses as well as pleurodesis treatment for  bilateral pleural effusions. Her cardiac history was notable for  rheumatic heart disease status post remote mitral valve commissurotomy  and paroxysmal atrial fibrillation on chronic Coumadin. She also has  multiple cardiac risk factors but no known history of coronary artery  disease.   A 2-D echocardiogram was done revealing normal left ventricular function  and moderate mitral stenosis. Dr. Andee [name] recommended an attempt at DC  cardioversion and the patient underwent successful TEE-guided  cardioversion on October 26, with maintenance of normal sinus rhythm  throughout her post hospital course. Once sinus rhythm was restored, the  patient also had marked spontaneous diuresis and ultimately lost  approximately 15 pounds since admission. Her medications were also  adjusted secondary to hypotension, with discontinuation of beta blocker.   The patient now presents one month after being released from the  hospital. She lives at [place] nursing home and has had home health  nursing attending to her. She has also had some initial protimes checked  and these have been managed by Dr. Olena [name]. The patient states, however,  that her 5 mg daily dose of Coumadin has not had to be adjusted.   The patient reports  significant improvement in her exercise tolerance.  She has much less shortness of breath, reports no orthopnea or PND, and  is now currently no longer on continuous home oxygen nor on nebulizer  treatment.   A 12-lead electrocardiogram  in our office today reveals NSR at 92 beats  BPM with normal axis and nonspecific ST abnormalities.   CURRENT MEDICATIONS:  1. Lasix 40 daily.  2. K-Dur 10 daily.  3. Digoxin 0.125 daily.  4. Lopressor 12.5 unknown dose.  5. Synthroid 0.25 daily.  6. Humulin N insulin 38 units q.a.m.  7. Lortab 5/500 q.4 h p.r.n.  8. Prilosec 20 mg daily.  9. Coumadin 5 mg as directed.  10.Ativan 0.5 t.i.d. p.r.n.  11.Ambien 5 q.h.s.   PHYSICAL EXAMINATION:  VITAL SIGNS:  Blood pressure 118/68, pulse 88,  regular. Weight 122.8.  GENERAL:  A 77 year old female sitting upright in no distress.  HEENT:  Normocephalic, atraumatic.  NECK:  Palpable bilateral carotid pulses; no JVD at 90 degrees.  LUNGS:  Symmetric breath sounds in the right base, but without  associated crackles or wheezes.  HEART:  Regular rate and rhythm (S1, S2), soft grade 1/6 systolic  ejection murmur at the base.  ABDOMEN:  Soft, nontender.  EXTREMITIES:  Trace pedal edema.  NEUROLOGIC:  No focal deficit.   IMPRESSION:  1. Atrial fibrillation.      a.     Status post successful TEE-guided DC cardioversion.  2. Normal sinus rhythm on chronic Coumadin.  3. Rheumatic heart disease.      a.     Moderate mitral stenosis by recent echocardiogram.      b.     Status post remote mitral valve commissurotomy [facility]).  4. Diastolic heart failure.  5. Status post hypotension.  6. Type 2 diabetes mellitus.  7. Hypothyroidism.   PLAN:  1. The patient will establish with our Coumadin clinic for close      monitoring and management of her Coumadin. Her INR level today is      1.2 and we will adjust her Coumadin dose accordingly.  2. Increase Lopressor to 12.5 b.i.d. for better basal heart rate       control.  3. Check followup blood work. Followup BMET today for monitoring of      electrolytes and renal function, given the patient was discharged      on a diuretic.  4. Return clinic followup with Dr. Andee [name] in 2 months.  5. The patient will be referred to a dermatologist for evaluation of      alopecia.      [name] Serpe, PA-C  Electronically Signed      [name],FACC  Electronically Signed   GS/MedQ  DD: 11/21/2006  DT: 11/22/2006  Job #: [id number]   cc:   [name]

## 2011-06-24 ENCOUNTER — Encounter: Payer: Self-pay | Admitting: Cardiology

## 2011-09-09 LAB — BASIC METABOLIC PANEL
BUN: 22
Chloride: 102
Creatinine, Ser: 1.02
Glucose, Bld: 116 — ABNORMAL HIGH
Potassium: 3.8

## 2011-09-09 LAB — COMPREHENSIVE METABOLIC PANEL
ALT: 25
Albumin: 3.5
Albumin: 3.1 — ABNORMAL LOW
Alkaline Phosphatase: 38 — ABNORMAL LOW
BUN: 18
Chloride: 96
Chloride: 104
Creatinine, Ser: 1.15
Potassium: 3.4 — ABNORMAL LOW
Sodium: 134 — ABNORMAL LOW
Total Bilirubin: 0.4
Total Bilirubin: 0.4
Total Protein: 6.6

## 2011-09-09 LAB — LIPID PANEL
HDL: 26 — ABNORMAL LOW
Total CHOL/HDL Ratio: 5.3
VLDL: 67 — ABNORMAL HIGH

## 2011-09-09 LAB — CBC
HCT: 29.8 — ABNORMAL LOW
HCT: 31.5 — ABNORMAL LOW
Hemoglobin: 11.4 — ABNORMAL LOW
MCHC: 34.3
MCHC: 34.3
MCV: 97
MCV: 98.2
Platelets: 165
Platelets: 178
Platelets: 191
RBC: 3.41 — ABNORMAL LOW
RDW: 14.3
RDW: 14.3
RDW: 14.8
WBC: 8.4
WBC: 5.8

## 2011-09-09 LAB — CARDIAC PANEL(CRET KIN+CKTOT+MB+TROPI)
CK, MB: 1.5
CK, MB: 1.6
CK, MB: 1.8
Relative Index: INVALID
Total CK: 49
Total CK: 49
Troponin I: 0.01
Troponin I: 0.04

## 2011-09-09 LAB — HEPARIN LEVEL (UNFRACTIONATED)
Heparin Unfractionated: 0.15 — ABNORMAL LOW
Heparin Unfractionated: <0.1 — ABNORMAL LOW
Heparin Unfractionated: 0.51

## 2011-09-09 LAB — PROTIME-INR
INR: 1.9 — ABNORMAL HIGH
INR: 1.1
Prothrombin Time: 20 — ABNORMAL HIGH
Prothrombin Time: 22.7 — ABNORMAL HIGH
Prothrombin Time: 14.9

## 2011-09-09 LAB — AMYLASE ISOENZYMES: Amylase.: 47 U/L (ref 30–110)

## 2011-09-23 ENCOUNTER — Encounter: Payer: Self-pay | Admitting: Cardiology

## 2011-09-23 ENCOUNTER — Ambulatory Visit (INDEPENDENT_AMBULATORY_CARE_PROVIDER_SITE_OTHER): Payer: Medicare Other | Admitting: Cardiology

## 2011-09-23 VITALS — BP 115/72 | HR 89 | Ht 63.0 in | Wt 142.5 lb

## 2011-09-23 DIAGNOSIS — G4700 Insomnia, unspecified: Secondary | ICD-10-CM | POA: Insufficient documentation

## 2011-09-23 DIAGNOSIS — I4891 Unspecified atrial fibrillation: Secondary | ICD-10-CM

## 2011-09-23 MED ORDER — TRAZODONE HCL 50 MG PO TABS: ORAL_TABLET | ORAL | Status: DC

## 2011-09-23 MED ORDER — METOPROLOL TARTRATE 25 MG PO TABS: ORAL_TABLET | ORAL | Status: DC

## 2011-09-23 NOTE — Assessment & Plan Note (Signed)
Patient was given a prescription of trazodone 50 mg by mouth each bedtime to be started at half dose each bedtime

## 2011-09-23 NOTE — Progress Notes (Signed)
History of present illness: The patient is an 77 year old female with a history of rheumatic heart disease, status post mitral valve replacement with Cox Maze procedure for atrial fibrillation. She has a Medtronic Mosaic porcine tissue valve. She also had closure of a patent foramen ovale. From a cardiac standpoint the patient doing quite well. She reports no palpitations shortness of breath orthopnea PND. She was recently hospitalized because of dehydration and urinary tract infection as well as ileus. She has done well and reports no recurrent fevers or chills. She has some difficulty with walking and is now using a cane. She has a history of spinal stenosis severe degenerative disc disease of the lumbosacral spine. Twelve-lead electrocardiogram today demonstrates normal sinus rhythm. Previously the patient was on amiodarone initially post Cox Maze procedure but this was discontinued. She is also not on Coumadin anymore. She has had no further atrial arrhythmias. Her depression has improved although she still has significant insomnia. The patient is requesting a sleeping aid   Allergies, family history and social history: As documented in chart and reviewed  Medications: Documented and reviewed in chart.  Past medical history: Reviewed and see problem list below   Review of systems: Insomnia. No palpitations or syncope. No orthopnea PND. No melena hematochezia. No dysuria frequency. No fever chills. Difficulty with gait, somewhat unsteady using a cane secondary to degenerative back disease   Physical examination : Vital signs documented below General: White female in no distress HEENT: EOMI, PERRLA, no thyromegaly nonnodular thyroid. Normal carotid upstroke bilaterally. Lungs: Clear breath sounds bilaterally no wheezing Heart: Regular rate and rhythm with normal S1-S2 no murmur rubs or gallops Abdomen: Soft nontender no rebound or guarding good bowel sounds Extremity exam: No cyanosis  clubbing or edema Neurologic: Alert and oriented x3. Decreased strength in lower extremities but otherwise grossly nonfocal Psychiatric: Normal affect Vascular exam: Normal peripheral pulses in her soft visit posterior tibial pulses bilaterally

## 2011-09-23 NOTE — Assessment & Plan Note (Signed)
No recurrent atrial fibrillation. Patient remains in normal sinus rhythm. She's currently off amiodarone and Coumadin

## 2011-09-23 NOTE — Patient Instructions (Signed)
   Metoprolol refilled  Trazodone 25mg  for insomnia Your physician wants you to follow up in: 6 months.  You will receive a reminder letter in the mail one-two months in advance.  If you don't receive a letter, please call our office to schedule the follow up appointment

## 2011-09-23 NOTE — Assessment & Plan Note (Signed)
No recurrent heart failure symptoms. Patient doing well from a cardiac perspective. No change in medical therapy we'll refill her metoprolol

## 2011-09-23 NOTE — Assessment & Plan Note (Signed)
No evidence of volume overload. 

## 2012-02-24 ENCOUNTER — Other Ambulatory Visit: Payer: Self-pay | Admitting: Cardiology

## 2012-02-25 ENCOUNTER — Encounter: Payer: Self-pay | Admitting: Cardiology

## 2012-02-25 ENCOUNTER — Ambulatory Visit (INDEPENDENT_AMBULATORY_CARE_PROVIDER_SITE_OTHER): Payer: Medicare Other | Admitting: Cardiology

## 2012-02-25 VITALS — BP 124/68 | HR 75 | Ht 63.0 in | Wt 148.0 lb

## 2012-02-25 DIAGNOSIS — I099 Rheumatic heart disease, unspecified: Secondary | ICD-10-CM

## 2012-02-25 DIAGNOSIS — J449 Chronic obstructive pulmonary disease, unspecified: Secondary | ICD-10-CM

## 2012-02-25 DIAGNOSIS — I4891 Unspecified atrial fibrillation: Secondary | ICD-10-CM

## 2012-02-25 DIAGNOSIS — I499 Cardiac arrhythmia, unspecified: Secondary | ICD-10-CM

## 2012-02-25 DIAGNOSIS — Q211 Atrial septal defect: Secondary | ICD-10-CM

## 2012-02-25 DIAGNOSIS — E039 Hypothyroidism, unspecified: Secondary | ICD-10-CM

## 2012-02-25 DIAGNOSIS — I509 Heart failure, unspecified: Secondary | ICD-10-CM

## 2012-02-25 DIAGNOSIS — K219 Gastro-esophageal reflux disease without esophagitis: Secondary | ICD-10-CM

## 2012-02-25 MED ORDER — METOPROLOL SUCCINATE ER 25 MG PO TB24: 25.0000 mg | ORAL_TABLET | Freq: Every day | ORAL | Status: DC

## 2012-02-25 NOTE — Patient Instructions (Signed)
Your physician wants you to follow-up in: 6 months. You will receive a reminder letter in the mail one-two months in advance. If you don't receive a letter, please call our office to schedule the follow-up appointment. Your physician has requested that you have an echocardiogram. Echocardiography is a painless test that uses sound waves to create images of your heart. It provides your doctor with information about the size and shape of your heart and how well your heart's chambers and valves are working. This procedure takes approximately one hour. There are no restrictions for this procedure. DO ECHO IN 6 MONTHS BEFORE OFFICE VISIT. Metoprolol ER 25 mg daily. New prescription sent to [facility].

## 2012-02-27 ENCOUNTER — Encounter: Payer: Self-pay | Admitting: Cardiology

## 2012-02-27 DIAGNOSIS — I499 Cardiac arrhythmia, unspecified: Secondary | ICD-10-CM | POA: Insufficient documentation

## 2012-02-27 DIAGNOSIS — Q211 Atrial septal defect: Secondary | ICD-10-CM | POA: Insufficient documentation

## 2012-02-27 DIAGNOSIS — E039 Hypothyroidism, unspecified: Secondary | ICD-10-CM | POA: Insufficient documentation

## 2012-02-27 DIAGNOSIS — I509 Heart failure, unspecified: Secondary | ICD-10-CM | POA: Insufficient documentation

## 2012-02-27 DIAGNOSIS — K219 Gastro-esophageal reflux disease without esophagitis: Secondary | ICD-10-CM | POA: Insufficient documentation

## 2012-02-27 DIAGNOSIS — I4891 Unspecified atrial fibrillation: Secondary | ICD-10-CM | POA: Insufficient documentation

## 2012-02-27 DIAGNOSIS — J449 Chronic obstructive pulmonary disease, unspecified: Secondary | ICD-10-CM | POA: Insufficient documentation

## 2012-02-27 DIAGNOSIS — I099 Rheumatic heart disease, unspecified: Secondary | ICD-10-CM | POA: Insufficient documentation

## 2012-02-27 NOTE — Progress Notes (Signed)
[name] Bottoms, MD, [facility] ABIM Board Certified in Adult Cardiovascular Medicine,Internal Medicine and Critical Care Medicine    CC: followup patient with history of mitral stenosis secondary to rheumatic heart disease  HPI:  The patient is doing well.  She reports no recurrent tachyarrhythmias.  There has been no recurrence of atrial fibrillation.  There have been no heart failure symptoms.  She is doing remarkably well.  At this point the patient continues to decline Coumadin but since her Cox-Maze procedure has remained in normal sinus rhythm.  She did increase her metoprolol to 12.5 mg by mouth twice a day.  Her vital signs have remained stable.  She reports no palpitations orthopnea PND presyncope or syncope. She states that she does not need trazodone for her insomnia.  She states remarkably healthy this winter with no upper respiratory tract infections or problems.   PMH: reviewed and listed in Problem List in Electronic Records (and see below) Past Medical History  Diagnosis Date  . Rheumatic heart disease     s/p right thoracotomy and mitral valve replacement with Medtronic Mosaic porcine tissue valve  . CHF (congestive heart failure)     normal LV systolic function  . Degenerative lumbar spinal stenosis     Severe degenerative disk disease of lumbosacral spine  . Hypothyroidism   . COPD (chronic obstructive pulmonary disease)   . GERD (gastroesophageal reflux disease)   . Arrhythmia     Sinus tachycardia  . Atrial fibrillation     status post Cox-Maze procedure maintaining normal sinus rhythm, amiodarone and Coumadin discontinued.  Marland Kitchen PFO (patent foramen ovale)     status post PFO closure at time of surgery   Past Surgical History  Procedure Date  . Exploratory laparotomy   . Foot surgery     left foot  . Percutaneous balloon valvuloplasty 1993    Mitral valve    Allergies/SH/FHX : available in Electronic Records for review  Allergies  Allergen Reactions  . Aspirin    . Sulfonamide Derivatives    History   Social History  . Marital Status: Widowed    Spouse Name: N/A    Number of Children: N/A  . Years of Education: N/A   Occupational History  . Not on file.   Social History Main Topics  . Smoking status: Former Smoker -- 0.3 packs/day for 37 years    Types: Cigarettes    Quit date: 12/20/1988  . Smokeless tobacco: Never Used  . Alcohol Use: No  . Drug Use: No  . Sexually Active: Not on file   Other Topics Concern  . Not on file   Social History Narrative  . No narrative on file   Family History  Problem Relation Age of Onset  . Coronary artery disease Neg Hx   . Diabetes Neg Hx   . Hypertension Neg Hx     Medications: Current Outpatient Prescriptions  Medication Sig Dispense Refill  . Cholecalciferol (VITAMIN D3) 2000 UNITS TABS Take 1 tablet by mouth 2 (two) times daily.       Marland Kitchen CRANBERRY FRUIT CONCENTRATE PO Take 1 capsule by mouth 2 (two) times daily.       . furosemide (LASIX) 20 MG tablet Take 20 mg by mouth daily.        . insulin NPH (HUMULIN N) 100 UNIT/ML injection Inject into the skin as directed.        Marland Kitchen levothyroxine (SYNTHROID, LEVOTHROID) 100 MCG tablet Take 100 mcg by mouth daily.        Marland Kitchen  LORazepam (ATIVAN) 0.5 MG tablet Take 0.5 mg by mouth 2 (two) times daily.        . Multiple Vitamin (MULTIVITAMIN) tablet Take 1 tablet by mouth daily.        . Omega-3 Fatty Acids (FISH OIL) 1200 MG CAPS Take 1 capsule by mouth daily.        . potassium chloride (KLOR-CON) 10 MEQ CR tablet Take 10 mEq by mouth daily.        . ranitidine (ZANTAC) 300 MG capsule Take 300 mg by mouth 2 (two) times daily.        . metoprolol succinate (TOPROL XL) 25 MG 24 hr tablet Take 1 tablet (25 mg total) by mouth daily.  90 tablet  3    ROS: No nausea or vomiting. No fever or chills.No melena or hematochezia.No bleeding.No claudication  Physical Exam: BP 124/68  Pulse 75  Ht 5\' 3"  (1.6 m)  Wt 148 lb (67.132 kg)  BMI 26.22 kg/m2   SpO2 98% General:well-nourished white female in no distress. Neck:normal carotid upstrokes and no carotid bruits.  No thyromegaly nonnodular thyroid Lungs:clear breath sounds bilaterally without wheezing Cardiac:regular rate and rhythm with normal S1 and S2 and no murmur rubs or gallops. Vascular:no edema.  Normal distal pulses Skin:warm and dry Physcologic:normal affect  12lead WUJ:WJXBJY sinus rhythm no acute changes Limited bedside ECHO:N/A No images are attached to the encounter.    Patient Active Problem List  Diagnoses  . DEPRESSION, SITUATIONAL, ACUTE  . RHEUMATIC HEART DISEASE  . CHF  . COPD  . SPINAL STENOSIS  . MITRAL VALVE REPLACEMENT, HX OF-status post bioprosthetic valve replacement  . Insomnia-improved  . PFO (patent foramen ovale)status post closure  . Atrial fibrillation-normal sinus rhythm of amiodarone and Coumadin  . GERD (gastroesophageal reflux disease)  . COPD (chronic obstructive pulmonary disease)  . Hypothyroidism    PLAN   Patient is doing remarkably well.  There is no evidence of recurrent atrial fibrillation.  She had a successful Cox-Maze procedure.  She remains off Coumadin and amiodarone.  As a matter of fact the patient is not in favor of long-term Coumadin.  Metoprolol was changed during his visits to metoprolol CR 25 min. By mouth daily with a 90 day supply.  Patient reports no heart failure symptoms.  We will see the patient back in 6 months.

## 2012-03-22 ENCOUNTER — Ambulatory Visit: Payer: Medicare Other | Admitting: Cardiology

## 2012-06-20 ENCOUNTER — Telehealth: Payer: Self-pay | Admitting: *Deleted

## 2012-06-20 DIAGNOSIS — R079 Chest pain, unspecified: Secondary | ICD-10-CM

## 2012-06-20 NOTE — Telephone Encounter (Signed)
Patient c/o pain on right side of chest (same area where recent surgery done).  Patient tearful & c/o of area being so tender that she could not even put her deodorant on this morning.  Advised patient to go to ED for evaluation.  States she already has OV with PMD this morning.  Will go ahead & see him first.

## 2012-08-24 ENCOUNTER — Other Ambulatory Visit: Payer: Self-pay | Admitting: Cardiology

## 2012-08-24 DIAGNOSIS — I4891 Unspecified atrial fibrillation: Secondary | ICD-10-CM

## 2012-08-31 ENCOUNTER — Other Ambulatory Visit (INDEPENDENT_AMBULATORY_CARE_PROVIDER_SITE_OTHER): Payer: Medicare Other

## 2012-08-31 ENCOUNTER — Other Ambulatory Visit: Payer: Self-pay

## 2012-08-31 DIAGNOSIS — I4891 Unspecified atrial fibrillation: Secondary | ICD-10-CM

## 2012-08-31 DIAGNOSIS — I509 Heart failure, unspecified: Secondary | ICD-10-CM

## 2012-09-04 ENCOUNTER — Encounter: Payer: Self-pay | Admitting: Cardiology

## 2012-09-04 ENCOUNTER — Ambulatory Visit (INDEPENDENT_AMBULATORY_CARE_PROVIDER_SITE_OTHER): Payer: Medicare Other | Admitting: Cardiology

## 2012-09-04 VITALS — BP 117/62 | HR 79 | Ht 63.0 in | Wt 148.0 lb

## 2012-09-04 DIAGNOSIS — I509 Heart failure, unspecified: Secondary | ICD-10-CM

## 2012-09-04 NOTE — Progress Notes (Signed)
HPI The patient presents for followup of her mitral valve replacement. Since she was last seen she has done well. She denies any chest pressure, neck or arm discomfort. She has no palpitations, presyncope or syncope. She has no weight gain she is limited by back pain in joint pain. She's been offered back surgery but she really wants to try to avoid this. She can still do some of her chores.   Allergies  Allergen Reactions  . Aspirin   . Sulfonamide Derivatives     Current Outpatient Prescriptions  Medication Sig Dispense Refill  . Cholecalciferol (VITAMIN D3) 2000 UNITS TABS Take 1 tablet by mouth 2 (two) times daily.       Marland Kitchen CRANBERRY FRUIT CONCENTRATE PO Take 1 capsule by mouth 2 (two) times daily.       . furosemide (LASIX) 20 MG tablet Take 20 mg by mouth daily.        . insulin NPH (HUMULIN N) 100 UNIT/ML injection Inject into the skin as directed.        Marland Kitchen levothyroxine (SYNTHROID, LEVOTHROID) 100 MCG tablet Take 100 mcg by mouth daily.        Marland Kitchen LORazepam (ATIVAN) 0.5 MG tablet Take 0.5 mg by mouth 2 (two) times daily.        . metoprolol succinate (TOPROL XL) 25 MG 24 hr tablet Take 1 tablet (25 mg total) by mouth daily.  90 tablet  3  . Multiple Vitamin (MULTIVITAMIN) tablet Take 1 tablet by mouth daily.        . Omega-3 Fatty Acids (FISH OIL) 1200 MG CAPS Take 1 capsule by mouth daily.        . potassium chloride (KLOR-CON) 10 MEQ CR tablet Take 10 mEq by mouth daily.        . ranitidine (ZANTAC) 300 MG capsule Take 300 mg by mouth 2 (two) times daily.        Marland Kitchen DISCONTD: metoprolol tartrate (LOPRESSOR) 25 MG tablet Take 12.5 mg by mouth daily.  45 tablet  6  . DISCONTD: traZODone (DESYREL) 50 MG tablet May take 1/2 tab (25mg ) as needed for insomnia.  May increase to one tab (50mg ) if needed.  30 tablet  1    Past Medical History  Diagnosis Date  . Rheumatic heart disease     s/p right thoracotomy and mitral valve replacement with Medtronic Mosaic porcine tissue valve  .  CHF (congestive heart failure)     normal LV systolic function  . Degenerative lumbar spinal stenosis     Severe degenerative disk disease of lumbosacral spine  . Hypothyroidism   . COPD (chronic obstructive pulmonary disease)   . GERD (gastroesophageal reflux disease)   . Arrhythmia     Sinus tachycardia  . Atrial fibrillation     status post Cox-Maze procedure maintaining normal sinus rhythm, amiodarone and Coumadin discontinued.  Marland Kitchen PFO (patent foramen ovale)     status post PFO closure at time of surgery    Past Surgical History  Procedure Date  . Exploratory laparotomy   . Foot surgery     left foot  . Percutaneous balloon valvuloplasty 1993    Mitral valve    ROS:  As stated in the HPI and negative for all other systems.  PHYSICAL EXAM BP 117/62  Pulse 79  Ht 5\' 3"  (1.6 m)  Wt 148 lb (67.132 kg)  BMI 26.22 kg/m2 GENERAL:  Well appearing HEENT:  Pupils equal round and reactive, fundi not visualized,  oral mucosa unremarkable NECK:  No jugular venous distention, waveform within normal limits, carotid upstroke brisk and symmetric, no bruits, no thyromegaly LUNGS:  Clear to auscultation bilaterally BACK:  No CVA tenderness CHEST:  , Well healed thoracotomy scar HEART:  PMI not displaced or sustained,S1 and S2 within normal limits, no S3, no S4, no clicks, no rubs, no murmurs ABD:  Flat, positive bowel sounds normal in frequency in pitch, no bruits, no rebound, no guarding, no midline pulsatile mass, no hepatomegaly, no splenomegaly EXT:  2 plus pulses throughout, no edema, no cyanosis no clubbing, varicose veins   EKG:  Sinus rhythm, rate 79, axis within normal limits, intervals within normal limits, no acute ST-T wave changes 09/04/2012  ASSESSMENT AND PLAN  RHEUMATIC HEART DISEASE  Her followup echocardiography demonstrated normal bioprosthetic valve function with a well preserved ejection fraction. No further evaluation or imaging is warranted.  CHF  She seems to  be euvolemic.  At this point, no change in therapy is indicated.  We have reviewed salt and fluid restrictions.  No further cardiovascular testing is indicated.   MITRAL VALVE REPLACEMENT, HX OF-status post bioprosthetic valve replacement  See above   Atrial fibrillation- Normal sinus rhythm off of amidarone and Coumadin

## 2012-09-04 NOTE — Patient Instructions (Addendum)
Your physician wants you to follow-up in: 6 months with Dr. Hochrein.  You will receive a reminder letter in the mail two months in advance. If you don't receive a letter, please call our office to schedule the follow-up appointment.  

## 2012-09-07 ENCOUNTER — Telehealth: Payer: Self-pay | Admitting: *Deleted

## 2012-09-07 NOTE — Telephone Encounter (Signed)
Patient informed. 

## 2012-09-07 NOTE — Telephone Encounter (Signed)
Message copied by Eustace Moore on Thu Sep 07, 2012 10:05 AM ------      Message from: Rollene Rotunda      Created: Sun Sep 03, 2012  9:19 PM       The mitral valve looks good and the EF is OK.  Call Ms. Denn with the results and send results to [facility] A, MD

## 2012-09-12 ENCOUNTER — Other Ambulatory Visit: Payer: Self-pay | Admitting: Cardiology

## 2012-09-12 ENCOUNTER — Telehealth: Payer: Self-pay | Admitting: Cardiology

## 2012-09-12 MED ORDER — METOPROLOL SUCCINATE ER 25 MG PO TB24: 25.0000 mg | ORAL_TABLET | Freq: Every day | ORAL | Status: DC

## 2012-09-12 NOTE — Telephone Encounter (Signed)
Patient called in wanting a new prescription with her new cardiologist name on bottle (Dr. Antoine Poche). Sent refill with Dr. Antoine Poche name to [facility] for metoprolol 25 mg qty of 90 and refill of 3.

## 2013-03-06 ENCOUNTER — Ambulatory Visit: Payer: Medicare Other | Admitting: Cardiology

## 2013-06-04 ENCOUNTER — Ambulatory Visit: Payer: Medicare Other | Admitting: Cardiology

## 2013-06-19 ENCOUNTER — Encounter: Payer: Self-pay | Admitting: Cardiovascular Disease

## 2013-06-19 ENCOUNTER — Ambulatory Visit (INDEPENDENT_AMBULATORY_CARE_PROVIDER_SITE_OTHER): Payer: Medicare Other | Admitting: Cardiovascular Disease

## 2013-06-19 VITALS — BP 124/66 | HR 78 | Ht 63.0 in | Wt 149.0 lb

## 2013-06-19 DIAGNOSIS — I4891 Unspecified atrial fibrillation: Secondary | ICD-10-CM

## 2013-06-19 DIAGNOSIS — I5032 Chronic diastolic (congestive) heart failure: Secondary | ICD-10-CM

## 2013-06-19 DIAGNOSIS — I099 Rheumatic heart disease, unspecified: Secondary | ICD-10-CM

## 2013-06-19 NOTE — Patient Instructions (Signed)
Continue all current medications. Your physician wants you to follow up in: 6 months.  You will receive a reminder letter in the mail one-two months in advance.  If you don't receive a letter, please call our office to schedule the follow up appointment   

## 2013-06-19 NOTE — Progress Notes (Signed)
Patient ID: [name], female   DOB: 11-25-1925, 79 y.o.   MRN: [id number]    CARDIOLOGY CONSULT NOTE  Patient ID: [name] MRN: [id number] DOB/AGE: 1925/01/14 79 y.o.  Admit date: (Not on file) Primary Physician Primary Cardiologist Reason for Consultation: Re-establish care  HPI: [name] is a very pleasant 79 y.o. Woman with a PMH significant for a bioprosthetic Medtronic mitral valve (implanted on 05-23-2009), s/p PFO closure, Atrial Fibrillation s/p Cox-Maze procedure (no longer takes Warfarin or Amiodarone), and diastolic heart failure. She also has a h/o COPD and hypothryoidism. She has some arthritic problems with her right shoulder and hands. She denies chest pain, shortness of breath, lightheadedness, dizziness, and palpitations. She tries to walk regularly.  Review of systems complete and found to be negative unless listed above in HPI  Past Medical History: As per HPI  Social History: she has one son who lives in [place]. She lost 3 sons within a 65-month timeframe, 2 who were in the service, and one to a heart attack.  Echocardiogram (Sept 2013): Study Conclusions: - Left ventricle: The cavity size was normal. Wall thickness was increased in a pattern of mild LVH. Systolic function was normal. The estimated ejection fraction was in the range of 60% to 65%. Wall motion was normal; there were no regional wall motion abnormalities. Features are consistent with a pseudonormal left ventricular filling pattern, with concomitant abnormal relaxation and increased filling pressure (grade 2 diastolic dysfunction). Doppler parameters are consistent with elevated mean left atrial filling pressure. - Aortic valve: Trivial regurgitation. Mean gradient: 9mm Hg (S). - Mitral valve: A porcine bioprosthesis was present. Trivial regurgitation. Mean gradient: 4mm Hg (D). Valve area by continuity equation (using LVOT flow): 2.37cm^2. - Left atrium: The atrium was  mildly dilated. - Tricuspid valve: Mild regurgitation. Peak RV-RA gradient: 15mm Hg (S).    Family History  Problem Relation Age of Onset  . Coronary artery disease Neg Hx   . Diabetes Neg Hx   . Hypertension Neg Hx     History   Social History  . Marital Status: Widowed    Spouse Name: N/A    Number of Children: N/A  . Years of Education: N/A   Occupational History  . Not on file.   Social History Main Topics  . Smoking status: Former Smoker -- 0.30 packs/day for 37 years    Types: Cigarettes    Quit date: 12/20/1988  . Smokeless tobacco: Never Used  . Alcohol Use: No  . Drug Use: No  . Sexually Active: Not on file   Other Topics Concern  . Not on file   Social History Narrative  . No narrative on file      (Not in a hospital admission)  Physical exam Blood pressure 124/66, pulse 78, height 5\' 3"  (1.6 m), weight 149 lb (67.586 kg). General: NAD Neck: No JVD, no thyromegaly or thyroid nodule.  Lungs: Clear to auscultation bilaterally with normal respiratory effort. CV: Nondisplaced PMI.  Heart regular S1/prosthetic S2, no S3/S4, no murmur.  No peripheral edema.  No carotid bruit.  Normal pedal pulses.  Abdomen: Soft, nontender, no hepatosplenomegaly, no distention.  Skin: Intact without lesions or rashes.  Neurologic: Alert and oriented x 3.  Psych: Normal affect. Extremities: No clubbing or cyanosis.  HEENT: Normal.   Labs:   Lab Results  Component Value Date   WBC 9.1 05/30/2009   HGB 11.3* 05/30/2009   HCT 33.3* 05/30/2009   MCV 96.2 05/30/2009  PLT 128* 05/30/2009   No results found for this basename: NA, K, CL, CO2, BUN, CREATININE, CALCIUM, LABALBU, PROT, BILITOT, ALKPHOS, ALT, AST, GLUCOSE,  in the last 168 hours Lab Results  Component Value Date   CKTOTAL 46 01/03/2008   CKMB 1.6 01/03/2008   TROPONINI  Value: 0.01        NO INDICATION OF MYOCARDIAL INJURY. 01/03/2008    Lab Results  Component Value Date   CHOL  Value: 139        ATP III  CLASSIFICATION:  <200     mg/dL   Desirable  161-096  mg/dL   Borderline High  >=045    mg/dL   High 03/28/8118   Lab Results  Component Value Date   HDL 26* 01/03/2008   Lab Results  Component Value Date   LDLCALC  Value: 46        Total Cholesterol/HDL:CHD Risk Coronary Heart Disease Risk Table                     Men   Women  1/2 Average Risk   3.4   3.3 01/03/2008   Lab Results  Component Value Date   TRIG 336* 01/03/2008   Lab Results  Component Value Date   CHOLHDL 5.3 01/03/2008   No results found for this basename: LDLDIRECT       EKG: 01/27/13-Sinus rhythm, rate 85 bpm, axis within normal limits, intervals within normal limits, no acute ST-T wave changes.    ASSESSMENT AND PLAN:  1. Rheumatic mitral disease s/p bioprosthetic valve replacement: symptomatically stable. Most recent echocardiogram from 08/2012 shows good function. Continue to monitor. 2. A Fib s/p Cox-Maze: presently in normal sinus rhythm. 3. Diastolic CHF: BP is well controlled, and pt is euvolemic.  Signed: Prentice Docker, MD, [facility] 06/19/2013, 1:18 PM

## 2013-08-08 ENCOUNTER — Telehealth: Payer: Self-pay | Admitting: Cardiovascular Disease

## 2013-08-08 MED ORDER — METOPROLOL SUCCINATE ER 25 MG PO TB24: 25.0000 mg | ORAL_TABLET | Freq: Every day | ORAL | Status: DC

## 2013-08-08 NOTE — Telephone Encounter (Signed)
Patient needs refill on metoprolol sent to [facility]

## 2013-08-08 NOTE — Telephone Encounter (Signed)
rx sent to pharmacy by e-script  

## 2013-11-12 ENCOUNTER — Telehealth: Payer: Self-pay | Admitting: Cardiovascular Disease

## 2013-11-14 NOTE — Telephone Encounter (Signed)
Created in error

## 2013-12-14 ENCOUNTER — Ambulatory Visit (INDEPENDENT_AMBULATORY_CARE_PROVIDER_SITE_OTHER): Payer: Medicare Other | Admitting: Cardiovascular Disease

## 2013-12-14 ENCOUNTER — Encounter: Payer: Self-pay | Admitting: Cardiovascular Disease

## 2013-12-14 VITALS — BP 110/67 | HR 78 | Ht 63.0 in | Wt 146.8 lb

## 2013-12-14 DIAGNOSIS — I5032 Chronic diastolic (congestive) heart failure: Secondary | ICD-10-CM

## 2013-12-14 DIAGNOSIS — I4891 Unspecified atrial fibrillation: Secondary | ICD-10-CM

## 2013-12-14 DIAGNOSIS — I099 Rheumatic heart disease, unspecified: Secondary | ICD-10-CM

## 2013-12-14 NOTE — Progress Notes (Signed)
Patient ID: [name], female   DOB: [birth year], 79 y.o.   MRN: [id number]      SUBJECTIVE: [name] is a very pleasant 79 yr old woman whom I last saw on 06/19/13. She has a PMH significant for a bioprosthetic Medtronic mitral valve (implanted on 05-23-2009), s/p PFO closure, Atrial Fibrillation s/p Cox-Maze procedure (no longer takes Warfarin or Amiodarone), and diastolic heart failure.  She also has a h/o COPD and hypothryoidism.  She denies chest pain, shortness of breath, lightheadedness, dizziness, and palpitations.  She says, "I feel like I'm doing really well".   Review of systems complete and found to be negative unless listed above in HPI   Past Medical History:  As per HPI   Social History: she has one son who lives in [place]. She lost 3 sons within a 61-month timeframe, 2 who were in the service, and one to a heart attack.   Echocardiogram (Sept 2013):  Study Conclusions: - Left ventricle: The cavity size was normal. Wall thickness was increased in a pattern of mild LVH. Systolic function was normal. The estimated ejection fraction was in the range of 60% to 65%. Wall motion was normal; there were no regional wall motion abnormalities. Features are consistent with a pseudonormal left ventricular filling pattern, with concomitant abnormal relaxation and increased filling pressure (grade 2 diastolic dysfunction). Doppler parameters are consistent with elevated mean left atrial filling pressure. - Aortic valve: Trivial regurgitation. Mean gradient: 9mm Hg (S). - Mitral valve: A porcine bioprosthesis was present. Trivial regurgitation. Mean gradient: 4mm Hg (D). Valve area by continuity equation (using LVOT flow): 2.37cm^2. - Left atrium: The atrium was mildly dilated. - Tricuspid valve: Mild regurgitation. Peak RV-RA gradient: 15mm Hg (S).      Allergies  Allergen Reactions  . Aspirin   . Sulfonamide Derivatives     Current Outpatient Prescriptions    Medication Sig Dispense Refill  . Cholecalciferol (VITAMIN D3) 2000 UNITS TABS Take 1 tablet by mouth 2 (two) times daily.       Marland Kitchen CRANBERRY FRUIT CONCENTRATE PO Take 1 capsule by mouth 2 (two) times daily.       . furosemide (LASIX) 20 MG tablet Take 20 mg by mouth daily.        . insulin NPH (HUMULIN N) 100 UNIT/ML injection Inject 40 Units into the skin 2 (two) times daily.       Marland Kitchen levothyroxine (SYNTHROID, LEVOTHROID) 100 MCG tablet Take 100 mcg by mouth daily.        Marland Kitchen LORazepam (ATIVAN) 0.5 MG tablet Take 0.5 mg by mouth 2 (two) times daily.        . metoprolol succinate (TOPROL XL) 25 MG 24 hr tablet Take 1 tablet (25 mg total) by mouth daily.  90 tablet  1  . Multiple Vitamin (MULTIVITAMIN) tablet Take 1 tablet by mouth daily.        . Omega-3 Fatty Acids (FISH OIL) 1200 MG CAPS Take 1 capsule by mouth daily.        . potassium chloride (KLOR-CON) 10 MEQ CR tablet Take 10 mEq by mouth daily.        . ranitidine (ZANTAC) 300 MG capsule Take 300 mg by mouth 2 (two) times daily.        . [DISCONTINUED] metoprolol tartrate (LOPRESSOR) 25 MG tablet Take 12.5 mg by mouth daily.  45 tablet  6  . [DISCONTINUED] traZODone (DESYREL) 50 MG tablet May take 1/2 tab (25mg ) as needed for insomnia.  May increase to one tab (50mg ) if needed.  30 tablet  1   No current facility-administered medications for this visit.    Past Medical History  Diagnosis Date  . Rheumatic heart disease     s/p right thoracotomy and mitral valve replacement with Medtronic Mosaic porcine tissue valve  . CHF (congestive heart failure)     normal LV systolic function  . Degenerative lumbar spinal stenosis     Severe degenerative disk disease of lumbosacral spine  . Hypothyroidism   . COPD (chronic obstructive pulmonary disease)   . GERD (gastroesophageal reflux disease)   . Arrhythmia     Sinus tachycardia  . Atrial fibrillation     status post Cox-Maze procedure maintaining normal sinus rhythm, amiodarone and  Coumadin discontinued.  Marland Kitchen PFO (patent foramen ovale)     status post PFO closure at time of surgery    Past Surgical History  Procedure Laterality Date  . Exploratory laparotomy    . Foot surgery      left foot  . Percutaneous balloon valvuloplasty  1993    Mitral valve    History   Social History  . Marital Status: Widowed    Spouse Name: N/A    Number of Children: N/A  . Years of Education: N/A   Occupational History  . Not on file.   Social History Main Topics  . Smoking status: Former Smoker -- 0.30 packs/day for 37 years    Types: Cigarettes    Quit date: 12/20/1988  . Smokeless tobacco: Never Used  . Alcohol Use: No  . Drug Use: No  . Sexual Activity: Not on file   Other Topics Concern  . Not on file   Social History Narrative  . No narrative on file    BP 110/67 Pulse 78    PHYSICAL EXAM General: NAD Neck: No JVD, no thyromegaly or thyroid nodule.  Lungs: Clear to auscultation bilaterally with normal respiratory effort. CV: Nondisplaced PMI.  Heart regular S1/S2, no S3/S4, no murmur.  No peripheral edema.  No carotid bruit.  Normal pedal pulses.  Abdomen: Soft, nontender, no hepatosplenomegaly, no distention.  Neurologic: Alert and oriented x 3.  Psych: Normal affect. Extremities: No clubbing or cyanosis.   ECG: reviewed and available in electronic records.      ASSESSMENT AND PLAN: 1. Rheumatic mitral disease s/p bioprosthetic valve replacement: symptomatically stable. Most recent echocardiogram from 08/2012 shows good function. Continue to monitor.  2. A Fib s/p Cox-Maze: presently in a regular rhythm.  3. Diastolic CHF: BP is well controlled, and pt is euvolemic.    Dispo: f/u 6 months.   Prentice Docker, M.D., F.A.C.C.

## 2013-12-14 NOTE — Patient Instructions (Signed)
Continue all current medications. Your physician wants you to follow up in: 6 months.  You will receive a reminder letter in the mail one-two months in advance.  If you don't receive a letter, please call our office to schedule the follow up appointment   

## 2014-03-15 ENCOUNTER — Encounter: Payer: Self-pay | Admitting: Cardiovascular Disease

## 2014-05-30 ENCOUNTER — Ambulatory Visit: Payer: Medicare Other | Admitting: Cardiovascular Disease

## 2014-06-20 ENCOUNTER — Ambulatory Visit: Payer: Medicare Other | Admitting: Cardiovascular Disease

## 2014-07-01 ENCOUNTER — Telehealth: Payer: Self-pay | Admitting: *Deleted

## 2014-07-01 NOTE — Telephone Encounter (Signed)
Pt called stating she needs to schedule her colonoscopy, pt said she needs to get it scheduled so she can tell " these people something if she has to have one or not" I made pt aware that Tyler Aas is the triage nurse that would schedule her and her PCP sent a referral for pt and if she is not having any problems she can be schedule, I made pt aware that Tyler Aas is with a patient and I will send her a message. Please advise [phone]

## 2014-07-01 NOTE — Telephone Encounter (Signed)
I called pt and have scheduled her for OV prior to scheduling a colonoscopy ( due to her age).  She is scheduled on 08/07/2014 at 9:30 AM with Gerrit Halls, NP. She said she had her last one a few years back in [place].  She said that her [facility] is asking her to get colonoscopy.

## 2014-07-03 ENCOUNTER — Ambulatory Visit (INDEPENDENT_AMBULATORY_CARE_PROVIDER_SITE_OTHER): Payer: Medicare Other | Admitting: Cardiovascular Disease

## 2014-07-03 ENCOUNTER — Encounter: Payer: Self-pay | Admitting: Cardiovascular Disease

## 2014-07-03 VITALS — BP 114/74 | HR 79 | Ht 63.0 in | Wt 146.4 lb

## 2014-07-03 DIAGNOSIS — I4891 Unspecified atrial fibrillation: Secondary | ICD-10-CM

## 2014-07-03 DIAGNOSIS — I5032 Chronic diastolic (congestive) heart failure: Secondary | ICD-10-CM

## 2014-07-03 DIAGNOSIS — I099 Rheumatic heart disease, unspecified: Secondary | ICD-10-CM

## 2014-07-03 NOTE — Progress Notes (Signed)
Patient ID: [name], female   DOB: 1925-05-23, 80 y.o.   MRN: [id number]      SUBJECTIVE: [name] is a very pleasant 80 yr old woman whom I last saw on 12/14/13. She has a PMH significant for a bioprosthetic Medtronic mitral valve (implanted on 05-23-2009), s/p PFO closure, atrial fibrillation s/p Cox-Maze procedure (no longer takes warfarin or amiodarone), and diastolic heart failure.  She also has a h/o COPD and hypothryoidism.  She was hospitalized in March at [facility] for chest pain. I reviewed all the relevant documentation and studies. Chest x-ray did not demonstrate any acute cardiopulmonary process, and her ECG demonstrated normal sinus rhythm. Her pain was felt to be noncardiac in etiology. She had three normal troponins and a normal CBC. She did have some chest tenderness to palpation at that time.  She has been doing very well and says "I think I'm doing very well for 80 years old". She denies chest pain, leg swelling, orthopnea and shortness of breath.    Allergies  Allergen Reactions  . Aspirin   . Sulfonamide Derivatives     Current Outpatient Prescriptions  Medication Sig Dispense Refill  . benazepril (LOTENSIN) 5 MG tablet Take 5 mg by mouth daily.      . Cholecalciferol (VITAMIN D3) 2000 UNITS TABS Take 1 tablet by mouth 2 (two) times daily.       Marland Kitchen CRANBERRY FRUIT CONCENTRATE PO Take 1 capsule by mouth 2 (two) times daily.       . diphenoxylate-atropine (LOMOTIL) 2.5-0.025 MG per tablet Take 1 tablet by mouth 4 (four) times daily as needed.       . furosemide (LASIX) 20 MG tablet Take 20 mg by mouth daily.        . Hydrocodone-Acetaminophen 5-300 MG TABS Take 1 tablet by mouth as needed. Qd prn      . insulin NPH (HUMULIN N) 100 UNIT/ML injection Inject 40 Units into the skin 2 (two) times daily.       . isosorbide mononitrate (IMDUR) 30 MG 24 hr tablet Take 30 mg by mouth daily.       Marland Kitchen levothyroxine (SYNTHROID, LEVOTHROID) 100 MCG tablet Take  100 mcg by mouth daily.        Marland Kitchen LORazepam (ATIVAN) 0.5 MG tablet Take 0.5 mg by mouth 2 (two) times daily.        . metoprolol succinate (TOPROL XL) 25 MG 24 hr tablet Take 1 tablet (25 mg total) by mouth daily.  90 tablet  1  . Multiple Vitamin (MULTIVITAMIN) tablet Take 1 tablet by mouth daily.        . Omega-3 Fatty Acids (FISH OIL) 1200 MG CAPS Take 1 capsule by mouth daily.        . potassium chloride (KLOR-CON) 10 MEQ CR tablet Take 10 mEq by mouth daily.        . ranitidine (ZANTAC) 300 MG capsule Take 300 mg by mouth 2 (two) times daily.        . simvastatin (ZOCOR) 20 MG tablet Take 20 mg by mouth daily.       . nitroGLYCERIN (NITROSTAT) 0.4 MG SL tablet Place 0.4 mg under the tongue every 5 (five) minutes as needed.       . [DISCONTINUED] metoprolol tartrate (LOPRESSOR) 25 MG tablet Take 12.5 mg by mouth daily.  45 tablet  6  . [DISCONTINUED] traZODone (DESYREL) 50 MG tablet May take 1/2 tab (25mg ) as needed for insomnia.  May  increase to one tab (50mg ) if needed.  30 tablet  1   No current facility-administered medications for this visit.    Past Medical History  Diagnosis Date  . Rheumatic heart disease     s/p right thoracotomy and mitral valve replacement with Medtronic Mosaic porcine tissue valve  . CHF (congestive heart failure)     normal LV systolic function  . Degenerative lumbar spinal stenosis     Severe degenerative disk disease of lumbosacral spine  . Hypothyroidism   . COPD (chronic obstructive pulmonary disease)   . GERD (gastroesophageal reflux disease)   . Arrhythmia     Sinus tachycardia  . Atrial fibrillation     status post Cox-Maze procedure maintaining normal sinus rhythm, amiodarone and Coumadin discontinued.  PFO (patent foramen ovale)     status post PFO closure at time of surgery    Past Surgical History  Procedure Laterality Date  . Exploratory laparotomy    . Foot surgery      left foot  . Percutaneous balloon valvuloplasty  1993     Mitral valve    History   Social History  . Marital Status: Widowed    Spouse Name: N/A    Number of Children: N/A  . Years of Education: N/A   Occupational History  . Not on file.   Social History Main Topics  . Smoking status: Former Smoker -- 0.30 packs/day for 37 years    Types: Cigarettes    Quit date: 12/20/1988  . Smokeless tobacco: Never Used  . Alcohol Use: No  . Drug Use: No  . Sexual Activity: Not on file   Other Topics Concern  . Not on file   Social History Narrative  . No narrative on file     Filed Vitals:   07/03/14 0824  BP: 114/74  Pulse: 79  Height: 5\' 3"  (1.6 m)  Weight: 146 lb 6.4 oz (66.407 kg)    PHYSICAL EXAM General: NAD Neck: No JVD, no thyromegaly. Lungs: Clear to auscultation bilaterally with normal respiratory effort. CV: Nondisplaced PMI.  Regular rate and rhythm, normal S1/S2, no S3/S4, no murmur. No pretibial or periankle edema.  No carotid bruit.  Normal pedal pulses.  Abdomen: Soft, nontender, no hepatosplenomegaly, no distention.  Neurologic: Alert and oriented x 3.  Psych: Normal affect. Extremities: No clubbing or cyanosis.   ECG: reviewed and available in electronic records.      ASSESSMENT AND PLAN: 1. Rheumatic mitral disease s/p bioprosthetic valve replacement: Symptomatically stable. Most recent echocardiogram from 08/2012 shows good function. Continue to monitor.  2. A Fib s/p Cox-Maze: Presently in a regular rhythm.  3. Diastolic CHF: BP is well controlled and pt is euvolemic.   Dispo: f/u 6 months.  , M.D., F.A.C.C.

## 2014-07-03 NOTE — Patient Instructions (Signed)
Continue all current medications. Your physician wants you to follow up in: 6 months.  You will receive a reminder letter in the mail one-two months in advance.  If you don't receive a letter, please call our office to schedule the follow up appointment   

## 2014-07-04 ENCOUNTER — Telehealth: Payer: Self-pay | Admitting: *Deleted

## 2014-07-04 NOTE — Telephone Encounter (Signed)
Pt called and canceled her OV appt for her colonoscopy, pt does not want to have it done, pt states she is 80 years old and she is not having a colonoscopy. Pt did not want to come to the OV either

## 2014-07-05 NOTE — Telephone Encounter (Signed)
Noted  

## 2014-07-08 ENCOUNTER — Telehealth: Payer: Self-pay | Admitting: *Deleted

## 2014-07-08 NOTE — Telephone Encounter (Signed)
Patient called wanting to let Dr. Purvis Sheffield know she has cancelled her colonoscopy.  Stated she was just old & didn't think she needed it.

## 2014-08-07 ENCOUNTER — Ambulatory Visit: Payer: Medicare Other | Admitting: Gastroenterology

## 2014-11-22 ENCOUNTER — Encounter: Payer: Self-pay | Admitting: Cardiovascular Disease

## 2015-01-07 ENCOUNTER — Ambulatory Visit (INDEPENDENT_AMBULATORY_CARE_PROVIDER_SITE_OTHER): Payer: No Typology Code available for payment source | Admitting: Cardiovascular Disease

## 2015-01-07 ENCOUNTER — Encounter: Payer: Self-pay | Admitting: Cardiovascular Disease

## 2015-01-07 VITALS — BP 110/60 | HR 75 | Ht 63.0 in | Wt 141.0 lb

## 2015-01-07 DIAGNOSIS — Z952 Presence of prosthetic heart valve: Secondary | ICD-10-CM

## 2015-01-07 DIAGNOSIS — I099 Rheumatic heart disease, unspecified: Secondary | ICD-10-CM

## 2015-01-07 DIAGNOSIS — I4891 Unspecified atrial fibrillation: Secondary | ICD-10-CM

## 2015-01-07 DIAGNOSIS — I5032 Chronic diastolic (congestive) heart failure: Secondary | ICD-10-CM

## 2015-01-07 DIAGNOSIS — Z954 Presence of other heart-valve replacement: Secondary | ICD-10-CM

## 2015-01-07 NOTE — Patient Instructions (Signed)

## 2015-01-07 NOTE — Progress Notes (Signed)
Patient ID: [name], female   DOB: 1925-09-25, 81 y.o.   MRN: [id number]      SUBJECTIVE: [name] is a very pleasant 81 yr old woman with a PMH significant for a bioprosthetic Medtronic mitral valve (implanted on 05-23-2009), s/p PFO closure, atrial fibrillation s/p Cox-Maze procedure, and chronic diastolic heart failure.  She also has a h/o COPD and hypothryoidism.  She was hospitalized for chest pain in October 2015 deemed secondary to chest wall inflammation from a fall. It appears this occurred after standing up too quickly after using the toilet (vasovagal). Today she is doing very well and denies dizziness, chest pain, leg swelling, and shortness of breath.    Review of Systems: As per "subjective", otherwise negative.  Allergies  Allergen Reactions  . Aspirin   . Sulfonamide Derivatives     Current Outpatient Prescriptions  Medication Sig Dispense Refill  . benazepril (LOTENSIN) 5 MG tablet Take 5 mg by mouth daily.    . Cholecalciferol (VITAMIN D3) 2000 UNITS TABS Take 1 tablet by mouth 2 (two) times daily.     Marland Kitchen CRANBERRY FRUIT CONCENTRATE PO Take 1 capsule by mouth 2 (two) times daily.     . diphenoxylate-atropine (LOMOTIL) 2.5-0.025 MG per tablet Take 1 tablet by mouth 4 (four) times daily as needed.     . furosemide (LASIX) 20 MG tablet Take 20 mg by mouth daily.      . Hydrocodone-Acetaminophen 5-300 MG TABS Take 1 tablet by mouth as needed. Qd prn    . insulin NPH (HUMULIN N) 100 UNIT/ML injection Inject 40 Units into the skin 2 (two) times daily.     . isosorbide mononitrate (IMDUR) 30 MG 24 hr tablet Take 30 mg by mouth daily.     Marland Kitchen levothyroxine (SYNTHROID, LEVOTHROID) 100 MCG tablet Take 100 mcg by mouth daily.      Marland Kitchen LORazepam (ATIVAN) 0.5 MG tablet Take 0.5 mg by mouth 2 (two) times daily.      . Multiple Vitamin (MULTIVITAMIN) tablet Take 1 tablet by mouth daily.      . Omega-3 Fatty Acids (FISH OIL) 1200 MG CAPS Take 1 capsule by mouth daily.       . potassium chloride (KLOR-CON) 10 MEQ CR tablet Take 10 mEq by mouth daily.      . ranitidine (ZANTAC) 300 MG capsule Take 300 mg by mouth 2 (two) times daily.      . simvastatin (ZOCOR) 20 MG tablet Take 20 mg by mouth daily.     . metoprolol succinate (TOPROL XL) 25 MG 24 hr tablet Take 1 tablet (25 mg total) by mouth daily. 90 tablet 1  . nitroGLYCERIN (NITROSTAT) 0.4 MG SL tablet Place 0.4 mg under the tongue every 5 (five) minutes as needed.     . [DISCONTINUED] metoprolol tartrate (LOPRESSOR) 25 MG tablet Take 12.5 mg by mouth daily. 45 tablet 6  . [DISCONTINUED] traZODone (DESYREL) 50 MG tablet May take 1/2 tab (25mg ) as needed for insomnia.  May increase to one tab (50mg ) if needed. 30 tablet 1   No current facility-administered medications for this visit.    Past Medical History  Diagnosis Date  . Rheumatic heart disease     s/p right thoracotomy and mitral valve replacement with Medtronic Mosaic porcine tissue valve  . CHF (congestive heart failure)     normal LV systolic function  . Degenerative lumbar spinal stenosis     Severe degenerative disk disease of lumbosacral spine  . Hypothyroidism   .  COPD (chronic obstructive pulmonary disease)   . GERD (gastroesophageal reflux disease)   . Arrhythmia     Sinus tachycardia  . Atrial fibrillation     status post Cox-Maze procedure maintaining normal sinus rhythm, amiodarone and Coumadin discontinued.  Marland Kitchen PFO (patent foramen ovale)     status post PFO closure at time of surgery    Past Surgical History  Procedure Laterality Date  . Exploratory laparotomy    . Foot surgery      left foot  . Percutaneous balloon valvuloplasty  1993    Mitral valve    History   Social History  . Marital Status: Widowed    Spouse Name: N/A    Number of Children: N/A  . Years of Education: N/A   Occupational History  . Not on file.   Social History Main Topics  . Smoking status: Former Smoker -- 0.30 packs/day for 37 years     Types: Cigarettes    Quit date: 12/20/1988  . Smokeless tobacco: Never Used  . Alcohol Use: No  . Drug Use: No  . Sexual Activity: Not on file   Other Topics Concern  . Not on file   Social History Narrative     Filed Vitals:   01/07/15 0807  BP: 110/60  Pulse: 75  Height: 5\' 3"  (1.6 m)  Weight: 141 lb (63.957 kg)  SpO2: 98%    PHYSICAL EXAM General: NAD HEENT: Normal. Neck: No JVD, no thyromegaly. Lungs: Clear to auscultation bilaterally with normal respiratory effort. CV: Nondisplaced PMI.  Regular rate and rhythm, normal S1/S2, no S3/S4, no murmur. No pretibial or periankle edema.  No carotid bruit.  Normal pedal pulses.  Abdomen: Soft, nontender, no hepatosplenomegaly, no distention.  Neurologic: Alert and oriented x 3.  Psych: Normal affect. Skin: Normal. Musculoskeletal: Normal range of motion, no gross deformities. Extremities: No clubbing or cyanosis.   ECG: Most recent ECG reviewed.      ASSESSMENT AND PLAN: 1. Rheumatic mitral disease s/p bioprosthetic valve replacement: Symptomatically stable. Most recent echocardiogram from 08/2012 shows good function. Continue to monitor.  2. Atrial fibrillatioin s/p Cox-Maze: Remains in a regular rhythm.  3. Chronic diastolic heart failure: BP is well controlled and pt is euvolemic.   Dispo: f/u 6 months.   09/2012, M.D., F.A.C.C.

## 2015-05-02 ENCOUNTER — Encounter: Payer: Self-pay | Admitting: *Deleted

## 2015-05-02 ENCOUNTER — Telehealth: Payer: Self-pay | Admitting: *Deleted

## 2015-05-02 DIAGNOSIS — R55 Syncope and collapse: Secondary | ICD-10-CM

## 2015-05-02 NOTE — Telephone Encounter (Signed)
Patient walked into office this morning requesting appointment per Dr. Olena Leatherwood for syncope.    Notes retrieved from Dr. Olena Leatherwood & from her recent [facility] visit that had negative work-up.  Above discussed with Dr. Purvis Sheffield.  Will order 30 day event monitor & schedule OV for Friday, 05/09/2015 here in [place] office.    Patient informed of above & verbalized understanding.  I will call if have cancellation before Friday.

## 2015-05-05 ENCOUNTER — Encounter: Payer: Self-pay | Admitting: Cardiovascular Disease

## 2015-05-05 ENCOUNTER — Ambulatory Visit: Payer: No Typology Code available for payment source | Admitting: Cardiovascular Disease

## 2015-05-05 ENCOUNTER — Ambulatory Visit (INDEPENDENT_AMBULATORY_CARE_PROVIDER_SITE_OTHER): Payer: No Typology Code available for payment source | Admitting: Cardiovascular Disease

## 2015-05-05 VITALS — BP 122/62 | HR 87 | Ht 63.0 in | Wt 140.0 lb

## 2015-05-05 DIAGNOSIS — I5032 Chronic diastolic (congestive) heart failure: Secondary | ICD-10-CM

## 2015-05-05 DIAGNOSIS — R55 Syncope and collapse: Secondary | ICD-10-CM | POA: Diagnosis not present

## 2015-05-05 DIAGNOSIS — I099 Rheumatic heart disease, unspecified: Secondary | ICD-10-CM | POA: Diagnosis not present

## 2015-05-05 DIAGNOSIS — Z954 Presence of other heart-valve replacement: Secondary | ICD-10-CM

## 2015-05-05 DIAGNOSIS — Z952 Presence of prosthetic heart valve: Secondary | ICD-10-CM

## 2015-05-05 DIAGNOSIS — I4891 Unspecified atrial fibrillation: Secondary | ICD-10-CM

## 2015-05-05 NOTE — Progress Notes (Signed)
Patient ID: [name], female   DOB: 24-Oct-1925, 81 y.o.   MRN: [id number]      SUBJECTIVE: [name] is a very pleasant 81 yr old woman with a PMH significant for a bioprosthetic Medtronic mitral valve (implanted on 05-23-2009), s/p PFO closure, atrial fibrillation s/p Cox-Maze procedure, and chronic diastolic heart failure.  She also has a h/o COPD and hypothryoidism.   She has been sent by Dr. Olena Leatherwood to see me for three syncopal episodes occurring within two months. I ordered an event monitor on 5/13 but she wasn't sure how to put it on, and thus it was attached by my staff today.  She was hospitalized in April at [facility] for syncope which was described as "probably related to postural hypotension". She was given IV fluids as BUN was 22 and creatinine was 0.91. Chest x-ray showed no acute disease. A carotid duplex reportedly showed no evidence of significant stenosis.   I reviewed all relevant documentation and labs and studies from this hospitalization. An echocardiogram performed on 04/04/15 demonstrated normal left ventricular systolic function, EF greater than 65%, moderate left atrial dilatation , mild aortic sclerosis , normally functioning bioprosthetic mitral valve with no regurgitation.   Head CT showed no intracranial mass or hemorrhage with patchy small vessel disease and no acute infarct.   ECG on 04/03/15 demonstrated normal sinus rhythm , heart rate 70 bpm. There were no ischemic ST segment or T-wave abnormalities.  She does not remember what happens beforehand, but says it always happens in the bathroom. It may occur after urinating. She thinks she passes out for one to two minutes. Her son has been driving her. Her PCP has recommended nursing home placement but she refuses this. She has had some mild dizziness. She is not orthostatic today.   Review of Systems: As per "subjective", otherwise negative.  Allergies  Allergen Reactions  . Aspirin   .  Sulfonamide Derivatives     Current Outpatient Prescriptions  Medication Sig Dispense Refill  . benazepril (LOTENSIN) 5 MG tablet Take 5 mg by mouth daily.    . Cholecalciferol (VITAMIN D3) 2000 UNITS TABS Take 1 tablet by mouth 2 (two) times daily.     Marland Kitchen CRANBERRY FRUIT CONCENTRATE PO Take 1 capsule by mouth 2 (two) times daily.     . diphenoxylate-atropine (LOMOTIL) 2.5-0.025 MG per tablet Take 1 tablet by mouth 4 (four) times daily as needed.     . furosemide (LASIX) 20 MG tablet Take 20 mg by mouth daily.      . Hydrocodone-Acetaminophen 5-300 MG TABS Take 1 tablet by mouth as needed. Qd prn    . insulin NPH (HUMULIN N) 100 UNIT/ML injection Inject 40 Units into the skin 2 (two) times daily.     . isosorbide mononitrate (IMDUR) 30 MG 24 hr tablet Take 30 mg by mouth daily.     Marland Kitchen levothyroxine (SYNTHROID, LEVOTHROID) 100 MCG tablet Take 100 mcg by mouth daily.      Marland Kitchen LORazepam (ATIVAN) 0.5 MG tablet Take 0.5 mg by mouth 2 (two) times daily.      . Multiple Vitamin (MULTIVITAMIN) tablet Take 1 tablet by mouth daily.      . Omega-3 Fatty Acids (FISH OIL) 1200 MG CAPS Take 1 capsule by mouth daily.      . potassium chloride (KLOR-CON) 10 MEQ CR tablet Take 10 mEq by mouth daily.      . ranitidine (ZANTAC) 300 MG capsule Take 300 mg by mouth 2 (  two) times daily.      . simvastatin (ZOCOR) 20 MG tablet Take 20 mg by mouth daily.     . metoprolol succinate (TOPROL XL) 25 MG 24 hr tablet Take 1 tablet (25 mg total) by mouth daily. 90 tablet 1  . nitroGLYCERIN (NITROSTAT) 0.4 MG SL tablet Place 0.4 mg under the tongue every 5 (five) minutes as needed.     . [DISCONTINUED] metoprolol tartrate (LOPRESSOR) 25 MG tablet Take 12.5 mg by mouth daily. 45 tablet 6  . [DISCONTINUED] traZODone (DESYREL) 50 MG tablet May take 1/2 tab ( ) as needed for insomnia.  May increase to one tab ( ) if needed. 30 tablet 1   No current facility-administered medications for this visit.    Past Medical History    Diagnosis Date  . Rheumatic heart disease     s/p right thoracotomy and mitral valve replacement with Medtronic Mosaic porcine tissue valve  . CHF (congestive heart failure)     normal LV systolic function  . Degenerative lumbar spinal stenosis     Severe degenerative disk disease of lumbosacral spine  . Hypothyroidism   . COPD (chronic obstructive pulmonary disease)   . GERD (gastroesophageal reflux disease)   . Arrhythmia     Sinus tachycardia  . Atrial fibrillation     status post Cox-Maze procedure maintaining normal sinus rhythm, amiodarone and Coumadin discontinued.  Marland Kitchen PFO (patent foramen ovale)     status post PFO closure at time of surgery    Past Surgical History  Procedure Laterality Date  . Exploratory laparotomy    . Foot surgery      left foot  . Percutaneous balloon valvuloplasty  1993    Mitral valve    History   Social History  . Marital Status: Widowed    Spouse Name: N/A  . Number of Children: N/A  . Years of Education: N/A   Occupational History  . Not on file.   Social History Main Topics  . Smoking status: Former Smoker -- 0.30 packs/day for 37 years    Types: Cigarettes    Quit date: 12/20/1988  . Smokeless tobacco: Never Used  . Alcohol Use: No  . Drug Use: No  . Sexual Activity: Not on file   Other Topics Concern  . Not on file   Social History Narrative     Filed Vitals:   05/05/15 1413  BP: 122/62  Pulse: 87  Height:  (1.6 m)  Weight: 140 lb (63.504 kg)  SpO2: 97%    PHYSICAL EXAM General: NAD HEENT: Normal. Neck: No JVD, no thyromegaly. Lungs: Clear to auscultation bilaterally with normal respiratory effort. CV: Nondisplaced PMI.  Regular rate and rhythm, normal S1/S2, no S3/S4, no murmur. No pretibial or periankle edema.  No carotid bruit.  Normal pedal pulses.  Abdomen: Soft, nontender, no hepatosplenomegaly, no distention.  Neurologic: Alert and oriented x 3.  Psych: Normal affect. Skin:  Normal. Musculoskeletal: Normal range of motion, no gross deformities. Extremities: No clubbing or cyanosis.   ECG: Most recent ECG reviewed.      ASSESSMENT AND PLAN: 1. Syncope: Pt now wearing 30-day event monitor which I ordered, primarily to evaluate for bradyarrhythmias and/or sinus pauses. In order to avoid precipitants, will d/c benazepril and Lasix altogether.  2. Rheumatic mitral disease s/p bioprosthetic valve replacement: Symptomatically stable. Most recent echocardiogram from 03/2015 showed normal function. Continue to monitor.   3. Atrial fibrillatioin s/p Cox-Maze: Remains in a regular rhythm. Wearing event monitor.  4. Chronic diastolic heart failure: BP is well controlled and pt is euvolemic.   Dispo: f/u 6 weeks.  Time spent: 40 minutes, of which greater than 50% was spent reviewing symptoms, relevant blood tests and studies, and discussing management plan with the patient.   Prentice Docker, M.D., F.A.C.C.

## 2015-05-05 NOTE — Patient Instructions (Signed)
Your physician has recommended you make the following change in your medication:  Stop furosemide. Stop benazepril. Continue all other medications the same. Your physician recommends that you schedule a follow-up appointment in: 6 weeks with Dr. Purvis Sheffield.

## 2015-05-06 DIAGNOSIS — R55 Syncope and collapse: Secondary | ICD-10-CM

## 2015-05-09 ENCOUNTER — Ambulatory Visit: Payer: No Typology Code available for payment source | Admitting: Cardiovascular Disease

## 2015-05-13 ENCOUNTER — Ambulatory Visit (INDEPENDENT_AMBULATORY_CARE_PROVIDER_SITE_OTHER): Payer: No Typology Code available for payment source

## 2015-05-13 ENCOUNTER — Other Ambulatory Visit: Payer: Self-pay | Admitting: *Deleted

## 2015-05-13 DIAGNOSIS — R55 Syncope and collapse: Secondary | ICD-10-CM | POA: Diagnosis not present

## 2015-06-10 ENCOUNTER — Telehealth: Payer: Self-pay | Admitting: *Deleted

## 2015-06-10 NOTE — Telephone Encounter (Signed)
[name], MD 06/09/2015 Routine   Narrative & Impression         Sinus rhythm with isolated PAC. No arrhythmias.

## 2015-06-10 NOTE — Telephone Encounter (Signed)
-----   Message from [name], MD sent at 06/09/2015  4:37 PM EDT ----- See above. No changes to management.

## 2015-06-10 NOTE — Telephone Encounter (Signed)
Notes Recorded by Lesle Chris, LPN on 09/08/1006 at 8:26 AM Patient notified. Follow up already scheduled for 06/17/2015 with Dr. Purvis Sheffield.

## 2015-06-17 ENCOUNTER — Encounter: Payer: Self-pay | Admitting: *Deleted

## 2015-06-17 ENCOUNTER — Telehealth: Payer: Self-pay | Admitting: Cardiovascular Disease

## 2015-06-17 ENCOUNTER — Ambulatory Visit (INDEPENDENT_AMBULATORY_CARE_PROVIDER_SITE_OTHER): Payer: Medicare Other | Admitting: Cardiovascular Disease

## 2015-06-17 VITALS — BP 110/60 | HR 72 | Ht 63.0 in | Wt 133.0 lb

## 2015-06-17 DIAGNOSIS — I099 Rheumatic heart disease, unspecified: Secondary | ICD-10-CM | POA: Diagnosis not present

## 2015-06-17 DIAGNOSIS — Z952 Presence of prosthetic heart valve: Secondary | ICD-10-CM

## 2015-06-17 DIAGNOSIS — I4891 Unspecified atrial fibrillation: Secondary | ICD-10-CM

## 2015-06-17 DIAGNOSIS — R55 Syncope and collapse: Secondary | ICD-10-CM | POA: Diagnosis not present

## 2015-06-17 DIAGNOSIS — I5032 Chronic diastolic (congestive) heart failure: Secondary | ICD-10-CM

## 2015-06-17 DIAGNOSIS — Z954 Presence of other heart-valve replacement: Secondary | ICD-10-CM

## 2015-06-17 DIAGNOSIS — E119 Type 2 diabetes mellitus without complications: Secondary | ICD-10-CM

## 2015-06-17 NOTE — Patient Instructions (Signed)
Your physician recommends that you schedule a follow-up appointment in: 3 MONTHS WITH DR. Purvis Sheffield  Your physician has recommended you make the following change in your medication:   STOP IMDUR  CONTINUE ALL OTHER MEDICATIONS AS DIRECTED   PLEASE SEE INSTRUCTIONS FOR LOOP RECORDER  You have been referred to ENDOCRINOLOGY   Thank you for choosing [facility]!!

## 2015-06-17 NOTE — Telephone Encounter (Signed)
No precert required for loop recorder.

## 2015-06-17 NOTE — Progress Notes (Signed)
Patient ID: [name], female   DOB: [birth year], 81 y.o.   MRN: [id number]      SUBJECTIVE: The patient returns for follow-up after undergoing cardiovascular testing performed for the evaluation of syncope. Event monitoring demonstrated normal sinus rhythm with isolated PACs and no arrhythmias.  In summary, [name] is a very pleasant 81 yr old woman with a PMH significant for a bioprosthetic Medtronic mitral valve (implanted on 05-23-2009), s/p PFO closure, atrial fibrillation s/p Cox-Maze procedure, and chronic diastolic heart failure, COPD, and hypothryoidism.   She was previously referred by Dr. Olena Leatherwood for three syncopal episodes occurring within two months.  She was hospitalized in April at [facility] for syncope which was described as "probably related to postural hypotension". She was given IV fluids as BUN was 22 and creatinine was 0.91. Chest x-ray showed no acute disease. A carotid duplex reportedly showed no evidence of significant stenosis.   Echocardiogram performed on 04/04/15 demonstrated normal left ventricular systolic function, EF greater than 65%, moderate left atrial dilatation , mild aortic sclerosis , normally functioning bioprosthetic mitral valve with no regurgitation.   Head CT showed no intracranial mass or hemorrhage with patchy small vessel disease and no acute infarct.   She tells me she had another syncopal episode and she had no forewarning. She denies antecedent chest pain. She believes it is due to hypoglycemic episodes. She denies having had a syncopal episode during event monitoring.     Review of Systems: As per "subjective", otherwise negative.  Allergies  Allergen Reactions  . Aspirin   . Sulfonamide Derivatives     Current Outpatient Prescriptions  Medication Sig Dispense Refill  . Cholecalciferol (VITAMIN D3) 2000 UNITS TABS Take 1 tablet by mouth 2 (two) times daily.     Marland Kitchen CRANBERRY FRUIT CONCENTRATE PO Take 1 capsule by  mouth 2 (two) times daily.     . diphenoxylate-atropine (LOMOTIL) 2.5-0.025 MG per tablet Take 1 tablet by mouth 4 (four) times daily as needed.     . Hydrocodone-Acetaminophen 5-300 MG TABS Take 1 tablet by mouth as needed. Qd prn    . insulin NPH (HUMULIN N) 100 UNIT/ML injection Inject 40 Units into the skin 2 (two) times daily.     . isosorbide mononitrate (IMDUR) 30 MG 24 hr tablet Take 30 mg by mouth daily.     Marland Kitchen levothyroxine (SYNTHROID, LEVOTHROID) 100 MCG tablet Take 100 mcg by mouth daily.      Marland Kitchen LORazepam (ATIVAN) 0.5 MG tablet Take 0.5 mg by mouth 2 (two) times daily.      . Multiple Vitamin (MULTIVITAMIN) tablet Take 1 tablet by mouth daily.      . Omega-3 Fatty Acids (FISH OIL) 1200 MG CAPS Take 1 capsule by mouth daily.      . potassium chloride (KLOR-CON) 10 MEQ CR tablet Take 10 mEq by mouth daily.      . ranitidine (ZANTAC) 300 MG capsule Take 300 mg by mouth 2 (two) times daily.      . simvastatin (ZOCOR) 20 MG tablet Take 20 mg by mouth daily.     . metoprolol succinate (TOPROL XL) 25 MG 24 hr tablet Take 1 tablet (25 mg total) by mouth daily. 90 tablet 1  . nitroGLYCERIN (NITROSTAT) 0.4 MG SL tablet Place 0.4 mg under the tongue every 5 (five) minutes as needed.     . [DISCONTINUED] metoprolol tartrate (LOPRESSOR) 25 MG tablet Take 12.5 mg by mouth daily. 45 tablet 6  . [DISCONTINUED] traZODone (  DESYREL) 50 MG tablet May take 1/2 tab (25mg ) as needed for insomnia.  May increase to one tab (50mg ) if needed. 30 tablet 1   No current facility-administered medications for this visit.    Past Medical History  Diagnosis Date  . Rheumatic heart disease     s/p right thoracotomy and mitral valve replacement with Medtronic Mosaic porcine tissue valve  . CHF (congestive heart failure)     normal LV systolic function  . Degenerative lumbar spinal stenosis     Severe degenerative disk disease of lumbosacral spine  . Hypothyroidism   . COPD (chronic obstructive pulmonary disease)    . GERD (gastroesophageal reflux disease)   . Arrhythmia     Sinus tachycardia  . Atrial fibrillation     status post Cox-Maze procedure maintaining normal sinus rhythm, amiodarone and Coumadin discontinued.  PFO (patent foramen ovale)     status post PFO closure at time of surgery    Past Surgical History  Procedure Laterality Date  . Exploratory laparotomy    . Foot surgery      left foot  . Percutaneous balloon valvuloplasty  1993    Mitral valve    History   Social History  . Marital Status: Widowed    Spouse Name: N/A  . Number of Children: N/A  . Years of Education: N/A   Occupational History  . Not on file.   Social History Main Topics  . Smoking status: Former Smoker -- 0.30 packs/day for 37 years    Types: Cigarettes    Quit date: 12/20/1988  . Smokeless tobacco: Never Used  . Alcohol Use: No  . Drug Use: No  . Sexual Activity: Not on file   Other Topics Concern  . Not on file   Social History Narrative     Filed Vitals:   06/17/15 0937  BP: 110/60  Pulse: 72  Height: 5\' 3"  (1.6 m)  Weight: 133 lb (60.328 kg)  SpO2: 94%    PHYSICAL EXAM General: NAD HEENT: Normal. Neck: No JVD, no thyromegaly. Lungs: Clear to auscultation bilaterally with normal respiratory effort. CV: Nondisplaced PMI.  Regular rate and rhythm, normal S1/prosthetic S2, no S3/S4, no murmur. No pretibial or periankle edema.   Abdomen: Soft, nontender, no distention.  Neurologic: Alert and oriented.  Psych: Normal affect. Skin: Normal. Musculoskeletal: No gross deformities. Extremities: No clubbing or cyanosis.   ECG: Most recent ECG reviewed.      ASSESSMENT AND PLAN: 1. Syncope: Has had recurrences. No sinus pauses or arrhythmias with event monitoring. Prior episodes may have been related to postural hypotension vs hypoglycemia vs undetected sinus pauses. In order to avoid precipitants, I previously d/c benazepril and Lasix altogether. I will d/c Imdur. I will  make arrangements for an implantable loop recorder.  I will also make a referral to endocrinology (Dr. 02/17/1989) for diabetes management.  2. Rheumatic mitral disease s/p bioprosthetic valve replacement: Symptomatically stable. Most recent echocardiogram from 03/2015 showed normal function. Continue to monitor.   3. Atrial fibrillatioin s/p Cox-Maze: Remains in a regular rhythm. No changes.  4. Chronic diastolic heart failure: BP is well controlled and pt is euvolemic.   Dispo: f/u 3 months.  , M.D., F.A.C.C.

## 2015-06-17 NOTE — Telephone Encounter (Signed)
Loop recorder SET FOR 7-8 Dr. Johney Frame [facility] Checking percert

## 2015-06-27 ENCOUNTER — Ambulatory Visit (HOSPITAL_COMMUNITY)
Admission: RE | Admit: 2015-06-27 | Discharge: 2015-06-27 | Disposition: A | Discharge status: Home / Self Care | Payer: Medicare Other | Source: Ambulatory Visit | Attending: Internal Medicine | Admitting: Internal Medicine

## 2015-06-27 ENCOUNTER — Encounter (HOSPITAL_COMMUNITY): Payer: Self-pay | Admitting: Internal Medicine

## 2015-06-27 ENCOUNTER — Encounter (HOSPITAL_COMMUNITY)
Admission: RE | Disposition: A | Discharge status: Home / Self Care | Payer: Self-pay | Source: Ambulatory Visit | Attending: Internal Medicine

## 2015-06-27 DIAGNOSIS — J449 Chronic obstructive pulmonary disease, unspecified: Secondary | ICD-10-CM | POA: Diagnosis not present

## 2015-06-27 DIAGNOSIS — I509 Heart failure, unspecified: Secondary | ICD-10-CM | POA: Diagnosis not present

## 2015-06-27 DIAGNOSIS — I10 Essential (primary) hypertension: Secondary | ICD-10-CM

## 2015-06-27 DIAGNOSIS — I481 Persistent atrial fibrillation: Secondary | ICD-10-CM

## 2015-06-27 DIAGNOSIS — E119 Type 2 diabetes mellitus without complications: Secondary | ICD-10-CM | POA: Insufficient documentation

## 2015-06-27 DIAGNOSIS — I4891 Unspecified atrial fibrillation: Secondary | ICD-10-CM | POA: Diagnosis not present

## 2015-06-27 DIAGNOSIS — R55 Syncope and collapse: Secondary | ICD-10-CM | POA: Diagnosis not present

## 2015-06-27 HISTORY — DX: Type 2 diabetes mellitus without complications: E11.9

## 2015-06-27 HISTORY — DX: Syncope and collapse: R55

## 2015-06-27 HISTORY — PX: EP IMPLANTABLE DEVICE: SHX172B

## 2015-06-27 LAB — GLUCOSE, CAPILLARY: Glucose-Capillary: 92 mg/dL (ref 65–99)

## 2015-06-27 SURGERY — LOOP RECORDER INSERTION
Anesthesia: LOCAL

## 2015-06-27 MED ORDER — LIDOCAINE HCL (PF) 1 % IJ SOLN
INTRAMUSCULAR | Status: DC | PRN
  Administered 2015-06-27: 20 mL

## 2015-06-27 SURGICAL SUPPLY — 2 items
LOOP REVEAL LINQSYS (Prosthesis & Implant Heart) ×2 IMPLANT
PACK LOOP INSERTION (CUSTOM PROCEDURE TRAY) ×2 IMPLANT

## 2015-06-27 NOTE — Discharge Instructions (Signed)
Keep incision dry for 48 hours.  Then ok to remove outer dressing.  Do not remove steristrips.  Return to [facility] Device clinic 07/07/15 at 2pm for wound check.

## 2015-06-27 NOTE — H&P (Signed)
EP Consult Note   Date:  06/27/2015   ID:  SHALLEN LUEDKE, DOB 08-Apr-1925, MRN [id number]  PCP:  Toma Deiters, MD  Cardiologist:  Dr Purvis Sheffield Primary Electrophysiologist: Hillis Range, MD    CC: syncope   History of Present Illness: [name] is a 81 y.o. female who presents today for electrophysiology evaluation.   She has had recurrent unexplained syncope.  These episodes started several months ago.  She reports abrupt syncope lasting several seconds without warning.  She has been evaluated by Dr Purvis Sheffield and had an event monitor placed during which she did not have syncope.  Her medicines have been adjusted.  EF is preserved by echo.  There is concern for arrhythmia as the possible cause for her syncope.  Today, she denies symptoms of palpitations, chest pain, shortness of breath, orthopnea, PND, lower extremity edema, claudication,  bleeding, or neurologic sequela. The patient is tolerating medications without difficulties and is otherwise without complaint today.    Past Medical History  Diagnosis Date  . Rheumatic heart disease     s/p right thoracotomy and mitral valve replacement with Medtronic Mosaic porcine tissue valve  . CHF (congestive heart failure)     normal LV systolic function  . Degenerative lumbar spinal stenosis     Severe degenerative disk disease of lumbosacral spine  . Hypothyroidism   . COPD (chronic obstructive pulmonary disease)   . GERD (gastroesophageal reflux disease)   . Arrhythmia     Sinus tachycardia  . Atrial fibrillation     status post Cox-Maze procedure maintaining normal sinus rhythm, amiodarone and Coumadin discontinued.  Marland Kitchen PFO (patent foramen ovale)     status post PFO closure at time of surgery  . Syncope     recurrent unexplained syncope  . Diabetes    Past Surgical History  Procedure Laterality Date  . Exploratory laparotomy    . Foot surgery      left foot  . Percutaneous balloon valvuloplasty  1993   Mitral valve  . Mitral valve replacement     Medicines: reviewed  Allergies:   Sulfonamide derivatives and Aspirin   Social History:  The patient  reports that she quit smoking about 26 years ago. Her smoking use included Cigarettes. She has a 11.1 pack-year smoking history. She has never used smokeless tobacco. She reports that she does not drink alcohol or use illicit drugs.   Family History:  The patient's  family history is negative for Coronary artery disease, Diabetes, and Hypertension.    ROS:  Please see the history of present illness.   All other systems are reviewed and negative.    PHYSICAL EXAM: VS:  BP 159/55 mmHg  Pulse 82  Temp(Src) 98.4 F (36.9 C) (Oral)  Resp 18  Ht 5\' 3"  (1.6 m)  Wt 60.782 kg (134 lb)  BMI 23.74 kg/m2  SpO2 99% , BMI Body mass index is 23.74 kg/(m^2). GEN: Well nourished, well developed, in no acute distress HEENT: normal Neck: no JVD, carotid bruits, or masses Cardiac: RRR with ectopy, support hose in place Respiratory:  clear to auscultation bilaterally, normal work of breathing GI: soft, nontender, nondistended, + BS MS: no deformity or atrophy Skin: warm and dry  Neuro:  Strength and sensation are intact Psych: euthymic mood, full affect  EKG:  EKGs are reviewed in epic   Lipid Panel     Component Value Date/Time   CHOL  01/03/2008 0535    139  ATP III CLASSIFICATION:  <200     mg/dL   Desirable  962-836  mg/dL   Borderline High  >=629    mg/dL   High   TRIG 476* 54/65/0354 0535   HDL 26* 01/03/2008 0535   CHOLHDL 5.3 01/03/2008 0535   VLDL 67* 01/03/2008 0535   LDLCALC  01/03/2008 0535    46        Total Cholesterol/HDL:CHD Risk Coronary Heart Disease Risk Table                     Men   Women  1/2 Average Risk   3.4   3.3     Wt Readings from Last 3 Encounters:  06/27/15 60.782 kg (134 lb)  06/17/15 60.328 kg (133 lb)  05/05/15 63.504 kg (140 lb)      Other studies Reviewed: Additional studies/  records that were reviewed today include: DR Caesar Chestnut note, prior echo  Review of the above records today demonstrates: preserved EF, as per hpi   ASSESSMENT AND PLAN:  1.  Recurrent unexplained syncope The patient has had recurrent syncope.  Given her advanced age and history, I share Dr Caesar Chestnut concern for arrhythmia as the cause of her symptoms.  She has worn event monitoring during which she did not have syncope.  I have therefore recommended implantable loop recorder placement at this time. Risks, benefits,and alternatives to ILR implant were discussed with the patient who wishes to proceed.  2. Atrial fibrillation chads2vasc score is at least 5 Not current felt to be a candidate for anticoagulation Will follow for afib with implantable loop recorder  3. HTN Stable No change required today   Follow-up:  Will need wound check 07/07/15 at 2pm at the [address] office post implant I will follow remotely and see in the office prn She will follow-up with Dr Purvis Sheffield as scheduled  Current medicines are reviewed at length with the patient today.   The patient does not have concerns regarding her medicines.  The following changes were made today:  none  Labs/ tests ordered today include:  Orders Placed This Encounter  Procedures  . Glucose, capillary  . EP PPM/ICD IMPLANT     Signed, Hillis Range, MD  06/27/2015 7:56 AM     [facility] [address] [place] [address] [phone] (office) [phone] (fax)

## 2015-06-30 ENCOUNTER — Telehealth: Payer: Self-pay | Admitting: Cardiovascular Disease

## 2015-06-30 NOTE — Telephone Encounter (Signed)
Patient has had abdominal lower pain and diarrhea. Stated that has been going on since she got her loop recorder. Would like to know what she can take

## 2015-06-30 NOTE — Telephone Encounter (Signed)
Spoke with [name], ([facility] nurse) and she said patient c/o lower abdominal pain and diarrhea. No c/o fever, vomiting, dizziness, chest pain or sob. Home health nurse instructed to contact patient's family doctor for advise.

## 2015-07-03 ENCOUNTER — Ambulatory Visit: Payer: No Typology Code available for payment source | Admitting: Cardiovascular Disease

## 2015-07-07 ENCOUNTER — Ambulatory Visit (INDEPENDENT_AMBULATORY_CARE_PROVIDER_SITE_OTHER): Payer: Medicare Other | Admitting: *Deleted

## 2015-07-07 DIAGNOSIS — R55 Syncope and collapse: Secondary | ICD-10-CM

## 2015-07-07 LAB — CUP PACEART INCLINIC DEVICE CHECK
Date Time Interrogation Session: 20160718142937
MDC IDC SET ZONE DETECTION INTERVAL: 2000 ms
MDC IDC SET ZONE DETECTION INTERVAL: 3000 ms
MDC IDC SET ZONE DETECTION INTERVAL: 420 ms

## 2015-07-07 NOTE — Progress Notes (Signed)
Wound check in clinic s/p ILR implant.  Wound well healed without redness or edema. Pt with 0 tachy episodes; 0 brady episodes; 0 asystole; 0 AF episodes; 0 symptom episodes. Plan to follow up via [facility] and with JA on 11/4 @ 0945.

## 2015-07-23 ENCOUNTER — Encounter: Payer: Self-pay | Admitting: Internal Medicine

## 2015-07-28 ENCOUNTER — Ambulatory Visit (INDEPENDENT_AMBULATORY_CARE_PROVIDER_SITE_OTHER): Payer: Medicare Other | Admitting: *Deleted

## 2015-07-28 DIAGNOSIS — R55 Syncope and collapse: Secondary | ICD-10-CM | POA: Diagnosis not present

## 2015-07-29 NOTE — Progress Notes (Signed)
Loop recorder 

## 2015-08-04 LAB — CUP PACEART REMOTE DEVICE CHECK: Date Time Interrogation Session: 20160815074435

## 2015-08-13 ENCOUNTER — Encounter: Payer: Self-pay | Admitting: Internal Medicine

## 2015-08-26 ENCOUNTER — Ambulatory Visit (INDEPENDENT_AMBULATORY_CARE_PROVIDER_SITE_OTHER): Payer: Medicare Other | Admitting: *Deleted

## 2015-08-26 DIAGNOSIS — R55 Syncope and collapse: Secondary | ICD-10-CM

## 2015-08-27 NOTE — Progress Notes (Signed)
Loop recorder 

## 2015-09-06 LAB — CUP PACEART REMOTE DEVICE CHECK: MDC IDC SESS DTM: 20160917160014

## 2015-09-06 NOTE — Progress Notes (Signed)
Carelink summary report received. Battery status OK. Normal device function. No new symptom episodes, tachy episodes, brady, or pause episodes. No new AF episodes. Monthly summary reports and ROV with JA on 10/24/2015 at 9:45am.

## 2015-09-11 ENCOUNTER — Ambulatory Visit: Payer: No Typology Code available for payment source | Admitting: Cardiovascular Disease

## 2015-09-15 ENCOUNTER — Encounter: Payer: Self-pay | Admitting: Cardiovascular Disease

## 2015-09-15 ENCOUNTER — Ambulatory Visit (INDEPENDENT_AMBULATORY_CARE_PROVIDER_SITE_OTHER): Payer: Medicare Other | Admitting: Cardiovascular Disease

## 2015-09-15 VITALS — BP 121/71 | HR 67 | Ht 63.0 in | Wt 140.0 lb

## 2015-09-15 DIAGNOSIS — I099 Rheumatic heart disease, unspecified: Secondary | ICD-10-CM | POA: Diagnosis not present

## 2015-09-15 DIAGNOSIS — R55 Syncope and collapse: Secondary | ICD-10-CM

## 2015-09-15 DIAGNOSIS — I4891 Unspecified atrial fibrillation: Secondary | ICD-10-CM

## 2015-09-15 DIAGNOSIS — I5032 Chronic diastolic (congestive) heart failure: Secondary | ICD-10-CM | POA: Diagnosis not present

## 2015-09-15 DIAGNOSIS — Z954 Presence of other heart-valve replacement: Secondary | ICD-10-CM

## 2015-09-15 DIAGNOSIS — Z952 Presence of prosthetic heart valve: Secondary | ICD-10-CM

## 2015-09-15 NOTE — Patient Instructions (Signed)
Your physician recommends that you continue on your current medications as directed. Please refer to the Current Medication list given to you today. Your physician recommends that you schedule a follow-up appointment in: 6 months. You will receive a reminder letter in the mail in about 4 months reminding you to call and schedule your appointment. If you don't receive this letter, please contact our office. 

## 2015-09-15 NOTE — Progress Notes (Signed)
Patient ID: [name], female   DOB: 1925/12/15, 81 y.o.   MRN: [id number]      SUBJECTIVE: The patient presents for routine follow-up. PMH significant for recurrent unexplained syncope, bioprosthetic Medtronic mitral valve (implanted on 05-23-2009), s/p PFO closure, atrial fibrillation s/p Cox-Maze procedure, chronic diastolic heart failure, COPD, and hypothryoidism.  She now has an implantable loop recorder. Recent device interrogation earlier this month did not demonstrate any bradyarrhythmias or pauses.  She was very enthusiastic in telling me that she has not passed out since I stopped several of her medications at her last visit. She occasionally has dizziness but this has been less frequent. She has reduced her insulin intake as well.  Review of Systems: As per "subjective", otherwise negative.  Allergies  Allergen Reactions  . Sulfonamide Derivatives Anaphylaxis, Shortness Of Breath and Swelling  . Aspirin     Uncoated Aspirin = stomach upset/pain EC Aspirin is ok    Current Outpatient Prescriptions  Medication Sig Dispense Refill  . alendronate (FOSAMAX) 70 MG tablet Take 1 tablet by mouth once a week.    . Cholecalciferol (VITAMIN D3) 2000 UNITS TABS Take 1 tablet by mouth 2 (two) times daily.     Marland Kitchen CRANBERRY FRUIT CONCENTRATE PO Take 1 capsule by mouth 2 (two) times daily.     . diphenoxylate-atropine (LOMOTIL) 2.5-0.025 MG per tablet Take 1 tablet by mouth 4 (four) times daily as needed for diarrhea or loose stools.     . Hydrocodone-Acetaminophen 5-300 MG TABS Take 1 tablet by mouth as needed (for pain). Qd prn    . insulin NPH (HUMULIN N) 100 UNIT/ML injection Inject 40 Units into the skin 2 (two) times daily as needed.     Marland Kitchen levothyroxine (SYNTHROID, LEVOTHROID) 100 MCG tablet Take 100 mcg by mouth daily before breakfast.     . LORazepam (ATIVAN) 0.5 MG tablet Take 0.5 mg by mouth 3 (three) times daily as needed for anxiety or sleep.     . metoprolol succinate  (TOPROL-XL) 25 MG 24 hr tablet Take 25 mg by mouth daily.    . Multiple Vitamin (MULTIVITAMIN) tablet Take 1 tablet by mouth daily.      . nitroGLYCERIN (NITROSTAT) 0.4 MG SL tablet Place 0.4 mg under the tongue every 5 (five) minutes x 3 doses as needed for chest pain.    . Omega-3 Fatty Acids (FISH OIL) 1200 MG CAPS Take 1 capsule by mouth 2 (two) times daily.     . potassium chloride (KLOR-CON) 10 MEQ CR tablet Take 10 mEq by mouth daily.      . ranitidine (ZANTAC) 300 MG capsule Take 300 mg by mouth 2 (two) times daily.      . [DISCONTINUED] metoprolol tartrate (LOPRESSOR) 25 MG tablet Take 12.5 mg by mouth daily. 45 tablet 6  . [DISCONTINUED] traZODone (DESYREL) 50 MG tablet May take 1/2 tab (25mg ) as needed for insomnia.  May increase to one tab (50mg ) if needed. 30 tablet 1   No current facility-administered medications for this visit.    Past Medical History  Diagnosis Date  . Rheumatic heart disease     s/p right thoracotomy and mitral valve replacement with Medtronic Mosaic porcine tissue valve  . CHF (congestive heart failure)     normal LV systolic function  . Degenerative lumbar spinal stenosis     Severe degenerative disk disease of lumbosacral spine  . Hypothyroidism   . COPD (chronic obstructive pulmonary disease)   . GERD (gastroesophageal reflux disease)   .  Arrhythmia     Sinus tachycardia  . Atrial fibrillation     status post Cox-Maze procedure maintaining normal sinus rhythm, amiodarone and Coumadin discontinued.  Marland Kitchen PFO (patent foramen ovale)     status post PFO closure at time of surgery  . Syncope     recurrent unexplained syncope  . Diabetes     Past Surgical History  Procedure Laterality Date  . Exploratory laparotomy    . Foot surgery      left foot  . Percutaneous balloon valvuloplasty  1993    Mitral valve  . Mitral valve replacement    . Ep implantable device N/A 06/27/2015    Procedure: Loop Recorder Insertion;  Surgeon: Hillis Range, MD;   Location: [facility] INVASIVE CV LAB;  Service: Cardiovascular;  Laterality: N/A;    Social History   Social History  . Marital Status: Widowed    Spouse Name: N/A  . Number of Children: N/A  . Years of Education: N/A   Occupational History  . Not on file.   Social History Main Topics  . Smoking status: Former Smoker -- 0.30 packs/day for 18 years    Types: Cigarettes    Start date: 05/26/1977    Quit date: 12/20/1988  . Smokeless tobacco: Never Used  . Alcohol Use: No  . Drug Use: No  . Sexual Activity: Not on file   Other Topics Concern  . Not on file   Social History Narrative   Lives in [place] Vitals:   09/15/15 1325  BP: 121/71  Pulse: 67  Height: 5\' 3"  (1.6 m)  Weight: 140 lb (63.504 kg)    PHYSICAL EXAM General: NAD HEENT: Normal. Neck: No JVD, no thyromegaly. Lungs: Clear to auscultation bilaterally with normal respiratory effort. CV: Nondisplaced PMI.  Regular rate and rhythm, normal S1/S2, no S3/S4, no murmur. No pretibial or periankle edema.    Abdomen: Soft, nontender, no distention.  Neurologic: Alert and oriented.  Psych: Normal affect. Skin: Normal. Musculoskeletal: No gross deformities. Extremities: No clubbing or cyanosis.   ECG: Most recent ECG reviewed.      ASSESSMENT AND PLAN: 1. Syncope: No recurrences. No sinus pauses or arrhythmias with loop recorder thus far. Follows up with Dr. in November.  2. Rheumatic mitral disease s/p bioprosthetic valve replacement: Symptomatically stable. Most recent echocardiogram from 03/2015 showed normal function. Continue to monitor.   3. Atrial fibrillation s/p Cox-Maze: Remains in a regular rhythm. No changes.  4. Chronic diastolic heart failure: BP is well controlled and pt is euvolemic.   Dispo: f/u 6 months.   04/2015, M.D., F.A.C.C.

## 2015-09-20 ENCOUNTER — Emergency Department (HOSPITAL_COMMUNITY)
Admission: EM | Admit: 2015-09-20 | Discharge: 2015-09-20 | Disposition: A | Discharge status: Home / Self Care | Payer: Medicare Other | Attending: Emergency Medicine | Admitting: Emergency Medicine

## 2015-09-20 ENCOUNTER — Emergency Department (HOSPITAL_COMMUNITY): Payer: Medicare Other

## 2015-09-20 ENCOUNTER — Encounter (HOSPITAL_COMMUNITY): Payer: Self-pay | Admitting: Emergency Medicine

## 2015-09-20 DIAGNOSIS — R079 Chest pain, unspecified: Secondary | ICD-10-CM | POA: Insufficient documentation

## 2015-09-20 DIAGNOSIS — K219 Gastro-esophageal reflux disease without esophagitis: Secondary | ICD-10-CM | POA: Diagnosis not present

## 2015-09-20 DIAGNOSIS — Z79899 Other long term (current) drug therapy: Secondary | ICD-10-CM | POA: Insufficient documentation

## 2015-09-20 DIAGNOSIS — Z8774 Personal history of (corrected) congenital malformations of heart and circulatory system: Secondary | ICD-10-CM | POA: Diagnosis not present

## 2015-09-20 DIAGNOSIS — Z794 Long term (current) use of insulin: Secondary | ICD-10-CM | POA: Insufficient documentation

## 2015-09-20 DIAGNOSIS — E039 Hypothyroidism, unspecified: Secondary | ICD-10-CM | POA: Insufficient documentation

## 2015-09-20 DIAGNOSIS — E119 Type 2 diabetes mellitus without complications: Secondary | ICD-10-CM | POA: Diagnosis not present

## 2015-09-20 DIAGNOSIS — I509 Heart failure, unspecified: Secondary | ICD-10-CM | POA: Diagnosis not present

## 2015-09-20 DIAGNOSIS — Z9889 Other specified postprocedural states: Secondary | ICD-10-CM | POA: Insufficient documentation

## 2015-09-20 DIAGNOSIS — J449 Chronic obstructive pulmonary disease, unspecified: Secondary | ICD-10-CM | POA: Insufficient documentation

## 2015-09-20 DIAGNOSIS — Z87891 Personal history of nicotine dependence: Secondary | ICD-10-CM | POA: Diagnosis not present

## 2015-09-20 DIAGNOSIS — Z8739 Personal history of other diseases of the musculoskeletal system and connective tissue: Secondary | ICD-10-CM | POA: Diagnosis not present

## 2015-09-20 LAB — BASIC METABOLIC PANEL
Anion gap: 9 (ref 5–15)
BUN: 20 mg/dL (ref 6–20)
CO2: 25 mmol/L (ref 22–32)
CREATININE: 0.82 mg/dL (ref 0.44–1.00)
Calcium: 8.7 mg/dL — ABNORMAL LOW (ref 8.9–10.3)
Chloride: 105 mmol/L (ref 101–111)
GFR calc non Af Amer: >60 mL/min (ref >60)
GLUCOSE: 77 mg/dL (ref 65–99)
Potassium: 3.7 mmol/L (ref 3.5–5.1)
Sodium: 139 mmol/L (ref 135–145)

## 2015-09-20 LAB — CBC
HEMATOCRIT: 34.1 % — AB (ref 36.0–46.0)
HEMOGLOBIN: 11.3 g/dL — AB (ref 12.0–15.0)
MCH: 32.4 pg (ref 26.0–34.0)
MCHC: 33.1 g/dL (ref 30.0–36.0)
MCV: 97.7 fL (ref 78.0–100.0)
Platelets: 128 10*3/uL — ABNORMAL LOW (ref 150–400)
RBC: 3.49 MIL/uL — ABNORMAL LOW (ref 3.87–5.11)
RDW: 14 % (ref 11.5–15.5)
WBC: 7.3 10*3/uL (ref 4.0–10.5)

## 2015-09-20 LAB — TROPONIN I: Troponin I: <0.03 ng/mL (ref <0.031)

## 2015-09-20 NOTE — ED Provider Notes (Signed)
CSN: [id number]     Arrival date & time 09/20/15  1944 History   First MD Initiated Contact with Patient 09/20/15 1945     Chief Complaint  Patient presents with  . Chest Pain     (Consider location/radiation/quality/duration/timing/severity/associated sxs/prior Treatment) Patient is a 81 y.o. female presenting with chest pain.  Chest Pain .Marland Kitchen...intermittent chest pain since early this morning without dyspnea, diaphoresis, nausea. No previous history of a MI. Status post mitral valve replacement in 2010 and EP Loop recorder insertion by Dr. Johney Frame in July 2016. Patient is ambulatory and lives at home independently. Symptoms radiate to left arm. Severity is mild to moderate.  Nothing makes symptoms better or worse  Past Medical History  Diagnosis Date  . Rheumatic heart disease     s/p right thoracotomy and mitral valve replacement with Medtronic Mosaic porcine tissue valve  . CHF (congestive heart failure) (HCC)     normal LV systolic function  . Degenerative lumbar spinal stenosis     Severe degenerative disk disease of lumbosacral spine  . Hypothyroidism   . COPD (chronic obstructive pulmonary disease) (HCC)   . GERD (gastroesophageal reflux disease)   . Arrhythmia     Sinus tachycardia  . Atrial fibrillation (HCC)     status post Cox-Maze procedure maintaining normal sinus rhythm, amiodarone and Coumadin discontinued.  Marland Kitchen PFO (patent foramen ovale)     status post PFO closure at time of surgery  . Syncope     recurrent unexplained syncope  . Diabetes [facility])    Past Surgical History  Procedure Laterality Date  . Exploratory laparotomy    . Foot surgery      left foot  . Percutaneous balloon valvuloplasty  1993    Mitral valve  . Mitral valve replacement    . Ep implantable device N/A 06/27/2015    Procedure: Loop Recorder Insertion;  Surgeon: Hillis Range, MD;  Location: [facility];  Service: Cardiovascular;  Laterality: N/A;   Family History  Problem Relation Age  of Onset  . Coronary artery disease Neg Hx   . Diabetes Neg Hx   . Hypertension Neg Hx    Social History  Substance Use Topics  . Smoking status: Former Smoker -- 0.30 packs/day for 18 years    Types: Cigarettes    Start date: 05/26/1977    Quit date: 12/20/1988  . Smokeless tobacco: Never Used  . Alcohol Use: No   OB History    No data available     Review of Systems  Cardiovascular: Positive for chest pain.  All other systems reviewed and are negative.     Allergies  Sulfonamide derivatives and Aspirin  Home Medications   Prior to Admission medications   Medication Sig Start Date End Date Taking? Authorizing Provider  Cholecalciferol (VITAMIN D3) 2000 UNITS TABS Take 1 tablet by mouth 2 (two) times daily.    Yes Historical Provider, MD  CRANBERRY FRUIT CONCENTRATE PO Take 1 capsule by mouth 2 (two) times daily.    Yes Historical Provider, MD  hydroxypropyl methylcellulose / hypromellose (ISOPTO TEARS / GONIOVISC) 2.5 % ophthalmic solution Place 1 drop into both eyes as needed for dry eyes.   Yes Historical Provider, MD  insulin NPH (HUMULIN N) 100 UNIT/ML injection Inject 40 Units into the skin daily.    Yes Historical Provider, MD  levothyroxine (SYNTHROID, LEVOTHROID) 100 MCG tablet Take 100 mcg by mouth daily before breakfast.    Yes Historical Provider, MD  metoprolol succinate (  TOPROL-XL) 25 MG 24 hr tablet Take 25 mg by mouth daily.   Yes Historical Provider, MD  Multiple Vitamin (MULTIVITAMIN) tablet Take 1 tablet by mouth daily.     Yes Historical Provider, MD  nitroGLYCERIN (NITROSTAT) 0.4 MG SL tablet Place 0.4 mg under the tongue every 5 (five) minutes x 3 doses as needed for chest pain.   Yes Historical Provider, MD  Omega-3 Fatty Acids (FISH OIL) 1200 MG CAPS Take 1 capsule by mouth 2 (two) times daily.    Yes Historical Provider, MD  potassium chloride (KLOR-CON) 10 MEQ CR tablet Take 10 mEq by mouth daily.     Yes Historical Provider, MD  ranitidine (ZANTAC)  300 MG capsule Take 300 mg by mouth 2 (two) times daily.     Yes Historical Provider, MD  alendronate (FOSAMAX) 70 MG tablet Take 1 tablet by mouth once a week. Takes on Thursday 09/09/15   Historical Provider, MD  diphenoxylate-atropine (LOMOTIL) 2.5-0.025 MG per tablet Take 1 tablet by mouth 4 (four) times daily as needed for diarrhea or loose stools.  11/20/13   Historical Provider, MD  Hydrocodone-Acetaminophen 5-300 MG TABS Take 1 tablet by mouth as needed (for pain). Qd prn 01/28/13   Historical Provider, MD  LORazepam (ATIVAN) 0.5 MG tablet Take 0.5 mg by mouth at bedtime as needed for anxiety or sleep.     Historical Provider, MD   BP 123/54 mmHg  Pulse 73  Temp(Src) 98.4 F (36.9 C) (Oral)  Resp 18  Ht 5' (1.524 m)  Wt 140 lb (63.504 kg)  BMI 27.34 kg/m2  SpO2 95% Physical Exam  Constitutional: She is oriented to person, place, and time. She appears well-developed and well-nourished.  HENT:  Head: Normocephalic and atraumatic.  Eyes: Conjunctivae and EOM are normal. Pupils are equal, round, and reactive to light.  Neck: Normal range of motion. Neck supple.  Cardiovascular: Normal rate and regular rhythm.   Pulmonary/Chest: Effort normal and breath sounds normal.  Abdominal: Soft. Bowel sounds are normal.  Musculoskeletal: Normal range of motion.  Neurological: She is alert and oriented to person, place, and time.  Skin: Skin is warm and dry.  Psychiatric: She has a normal mood and affect. Her behavior is normal.  Nursing note and vitals reviewed.   ED Course  Procedures (including critical care time) Labs Review Labs Reviewed  CBC - Abnormal; Notable for the following:    RBC 3.49 (*)    Hemoglobin 11.3 (*)    HCT 34.1 (*)    Platelets 128 (*)    All other components within normal limits  BASIC METABOLIC PANEL - Abnormal; Notable for the following:    Calcium 8.7 (*)    All other components within normal limits  TROPONIN I    Imaging Review Dg Chest 2  View  09/20/2015   CLINICAL DATA:  Shortness of breath and weakness  EXAM: CHEST  2 VIEW  COMPARISON:  05/29/2015  FINDINGS: COPD with chronic hyperinflation and interstitial coarsening at baseline. Stable lobulation of the bilateral diaphragm. No effusion, consolidation, or pneumothorax.  Status post mitral valve replacement. Loop recorder noted over the left heart. No cardiomegaly. Stable aortic tortuosity.  Prominent degenerative changes of the thoracolumbar junction. No acute fracture suspected.  IMPRESSION: COPD without acute superimposed finding.   Electronically Signed   By: Marnee Spring M.D.   On: 09/20/2015 21:36   I have personally reviewed and evaluated these images and lab results as part of my medical decision-making.  EKG Interpretation   Date/Time:  Saturday September 20 2015 19:53:23 EDT Ventricular Rate:  85 PR Interval:  176 QRS Duration: 78 QT Interval:  384 QTC Calculation: 457 R Axis:   45 Text Interpretation:  Sinus rhythm Atrial premature complexes Low voltage,  precordial leads RSR' in V1 or V2, right VCD or RVH Confirmed by Stone Spirito  MD,  Eren Ryser ([id number]) on 09/20/2015 8:02:15 PM      MDM   Final diagnoses:  Chest pain, unspecified chest pain type    Patient is hemodynamically stable. Screening EKG, chest x-ray, labs, troponin all negative. She has primary care and cardiology follow-up.    Donnetta Hutching, MD 09/20/15 2211

## 2015-09-20 NOTE — ED Notes (Signed)
Patient ambulated to restroom, tolerated well. 

## 2015-09-20 NOTE — ED Notes (Signed)
Pt c/o central chest pain that is tightness and sharp since lunch today. Pain radiating down left arm. Denies n/v/d. Pt is non diaphoretic

## 2015-09-20 NOTE — Discharge Instructions (Signed)
Follow-up your primary care doctor. °

## 2015-09-25 ENCOUNTER — Ambulatory Visit (INDEPENDENT_AMBULATORY_CARE_PROVIDER_SITE_OTHER): Payer: Medicare Other | Admitting: *Deleted

## 2015-09-25 DIAGNOSIS — R55 Syncope and collapse: Secondary | ICD-10-CM

## 2015-09-26 NOTE — Progress Notes (Signed)
Loop recorder 

## 2015-10-05 LAB — CUP PACEART REMOTE DEVICE CHECK: Date Time Interrogation Session: 20161006113510

## 2015-10-05 NOTE — Progress Notes (Signed)
Carelink summary report received. Battery status OK. Normal device function. No new symptom episodes, tachy episodes, brady, or pause episodes. No new AF episodes. Monthly summary reports and ROV with JA on 10/24/15 at 9:45am.

## 2015-10-14 ENCOUNTER — Encounter: Payer: Self-pay | Admitting: Internal Medicine

## 2015-10-24 ENCOUNTER — Encounter: Payer: Self-pay | Admitting: Internal Medicine

## 2015-10-24 ENCOUNTER — Ambulatory Visit (INDEPENDENT_AMBULATORY_CARE_PROVIDER_SITE_OTHER): Payer: Medicare Other | Admitting: Internal Medicine

## 2015-10-24 ENCOUNTER — Encounter: Payer: Self-pay | Admitting: Cardiology

## 2015-10-24 VITALS — BP 118/64 | HR 69 | Ht 60.0 in | Wt 132.0 lb

## 2015-10-24 DIAGNOSIS — I480 Paroxysmal atrial fibrillation: Secondary | ICD-10-CM | POA: Diagnosis not present

## 2015-10-24 DIAGNOSIS — I459 Conduction disorder, unspecified: Secondary | ICD-10-CM

## 2015-10-24 DIAGNOSIS — R55 Syncope and collapse: Secondary | ICD-10-CM | POA: Insufficient documentation

## 2015-10-24 NOTE — Progress Notes (Signed)
PCP: Toma Deiters, MD Primary Cardiologist:  Dr Valaria Good is a 81 y.o. female who presents today for routine electrophysiology followup.  Since her ILR implant, the patient reports doing very well.  She has had no further syncope.   Today, she denies symptoms of palpitations, chest pain, shortness of breath,  lower extremity edema, dizziness, presyncope, or syncope.  The patient is otherwise without complaint today.   Past Medical History  Diagnosis Date  . Rheumatic heart disease     s/p right thoracotomy and mitral valve replacement with Medtronic Mosaic porcine tissue valve  . CHF (congestive heart failure) (HCC)     normal LV systolic function  . Degenerative lumbar spinal stenosis     Severe degenerative disk disease of lumbosacral spine  . Hypothyroidism   . COPD (chronic obstructive pulmonary disease) (HCC)   . GERD (gastroesophageal reflux disease)   . Arrhythmia     Sinus tachycardia  . Atrial fibrillation (HCC)     status post Cox-Maze procedure maintaining normal sinus rhythm, amiodarone and Coumadin discontinued.  Marland Kitchen PFO (patent foramen ovale)     status post PFO closure at time of surgery  . Syncope     recurrent unexplained syncope  . Diabetes [facility])    Past Surgical History  Procedure Laterality Date  . Exploratory laparotomy    . Foot surgery      left foot  . Percutaneous balloon valvuloplasty  1993    Mitral valve  . Mitral valve replacement    . Ep implantable device N/A 06/27/2015    MDT LINQ ILR implanted by Dr Johney Frame for unexplained syncope    ROS- all systems are reviewed and negatives except as per HPI above  Current Outpatient Prescriptions  Medication Sig Dispense Refill  . alendronate (FOSAMAX) 70 MG tablet Take 1 tablet by mouth once a week. Takes on Thursday    . Cholecalciferol (VITAMIN D3) 2000 UNITS TABS Take 1 tablet by mouth 2 (two) times daily.     Marland Kitchen CRANBERRY FRUIT CONCENTRATE PO Take 1 capsule by mouth 2 (two) times  daily.     . diphenoxylate-atropine (LOMOTIL) 2.5-0.025 MG per tablet Take 1 tablet by mouth 4 (four) times daily as needed for diarrhea or loose stools.     . Hydrocodone-Acetaminophen 5-300 MG TABS Take 1 tablet by mouth as needed (for pain). Qd prn    . hydroxypropyl methylcellulose / hypromellose (ISOPTO TEARS / GONIOVISC) 2.5 % ophthalmic solution Place 1 drop into both eyes as needed for dry eyes.    . insulin NPH (HUMULIN N) 100 UNIT/ML injection Inject 40 Units into the skin daily.     Marland Kitchen levothyroxine (SYNTHROID, LEVOTHROID) 100 MCG tablet Take 100 mcg by mouth daily before breakfast.     . LORazepam (ATIVAN) 0.5 MG tablet Take 0.5 mg by mouth at bedtime as needed for anxiety or sleep.     . metoprolol succinate (TOPROL-XL) 25 MG 24 hr tablet Take 25 mg by mouth daily.    . Multiple Vitamin (MULTIVITAMIN) tablet Take 1 tablet by mouth daily.      . nitroGLYCERIN (NITROSTAT) 0.4 MG SL tablet Place 0.4 mg under the tongue every 5 (five) minutes x 3 doses as needed for chest pain.    . Omega-3 Fatty Acids (FISH OIL) 1200 MG CAPS Take 1 capsule by mouth 2 (two) times daily.     . potassium chloride (KLOR-CON) 10 MEQ CR tablet Take 10 mEq by mouth daily.      Marland Kitchen  ranitidine (ZANTAC) 300 MG capsule Take 300 mg by mouth 2 (two) times daily.      . [DISCONTINUED] metoprolol tartrate (LOPRESSOR) 25 MG tablet Take 12.5 mg by mouth daily. 45 tablet 6  . [DISCONTINUED] traZODone (DESYREL) 50 MG tablet May take 1/2 tab (25mg ) as needed for insomnia.  May increase to one tab (50mg ) if needed. 30 tablet 1   No current facility-administered medications for this visit.    Physical Exam: Filed Vitals:   10/24/15 1016  BP: 118/64  Pulse: 69  Height: 5' (1.524 m)  Weight: 132 lb (59.875 kg)  SpO2: 99%    GEN- The patient is well appearing, alert and oriented x 3 today.   Head- normocephalic, atraumatic Eyes-  Sclera clear, conjunctiva pink Ears- hearing intact Oropharynx- clear Lungs- Clear to  ausculation bilaterally, normal work of breathing Heart- Regular rate and rhythm, no murmurs, rubs or gallops, PMI not laterally displaced GI- soft, NT, ND, + BS Extremities- no clubbing, cyanosis, or edema  ILR interrogation reveals no arrhythmias  Assessment and Plan:  1. Recurrent unexplained syncope Unclear etiology ILR reveals no arrhythmias Will continue to follow remotely with carelink  2. afib No afib on ILR interrogation since implant Poor candidate for anticoagulation presently though we may reconsider if she ever demonstrates a high AF burden  Return to see me in 1 year unless she has further syncope or arrhythmias detected Follow-up with Dr as scheduled

## 2015-10-24 NOTE — Patient Instructions (Signed)
Your physician recommends that you schedule a follow-up appointment in: 1 year. You can schedule this appointment today or you can wait for your letter to come in the mail in about   months reminding you to call and schedule this appointment. If you do not receive this letter, please contact our office for your appointment. Monthly remote checks. Your physician recommends that you continue on your current medications as directed. Please refer to the Current Medication list given to you today.

## 2015-10-27 ENCOUNTER — Ambulatory Visit (INDEPENDENT_AMBULATORY_CARE_PROVIDER_SITE_OTHER): Payer: Medicare Other | Admitting: *Deleted

## 2015-10-27 DIAGNOSIS — R55 Syncope and collapse: Secondary | ICD-10-CM

## 2015-10-27 LAB — CUP PACEART INCLINIC DEVICE CHECK: Date Time Interrogation Session: 20161104143119

## 2015-10-27 NOTE — Progress Notes (Signed)
Carelink Summary Report / Loop recorder 

## 2015-10-30 ENCOUNTER — Telehealth: Payer: Self-pay | Admitting: Internal Medicine

## 2015-10-30 NOTE — Telephone Encounter (Signed)
Spoke w/ pt and she wanted to know why she received the disconnected monitor letter. I informed pt she received the letter b/c we had not received a transmission from her home monitor since 10-11-15. I informed pt that she can disregard the letter b/c we did receive a transmission on 10-29-15. Pt verbalized understanding.

## 2015-10-30 NOTE — Telephone Encounter (Signed)
NeW Message  Pt does not understand why she received letter about remote transmission- had appt w/ Dr Johney Frame on 11/4 and remote appt on 11/7. Letter sent on  11/4. Please call back and discuss,.

## 2015-10-31 ENCOUNTER — Encounter: Payer: Self-pay | Admitting: Internal Medicine

## 2015-11-23 LAB — CUP PACEART REMOTE DEVICE CHECK: Date Time Interrogation Session: 20161105120508

## 2015-11-23 NOTE — Progress Notes (Signed)
Carelink summary report received. Battery status OK. Normal device function. No new symptom episodes, tachy episodes, brady, or pause episodes. No new AF episodes. Monthly summary reports and ROV with JA in 10/2016.

## 2015-11-24 ENCOUNTER — Ambulatory Visit (INDEPENDENT_AMBULATORY_CARE_PROVIDER_SITE_OTHER): Payer: Medicare Other | Admitting: *Deleted

## 2015-11-24 DIAGNOSIS — R55 Syncope and collapse: Secondary | ICD-10-CM | POA: Diagnosis not present

## 2015-11-24 NOTE — Progress Notes (Signed)
Carelink Summary Report / Loop Recorder 

## 2015-11-28 NOTE — Progress Notes (Signed)
Carelink summary report received. Battery status OK. Normal device function. No new symptom episodes, tachy episodes, brady, or pause episodes. No new AF episodes. Monthly summary reports and ROV w/ JA 10/2016 in [place]

## 2015-12-24 ENCOUNTER — Ambulatory Visit (INDEPENDENT_AMBULATORY_CARE_PROVIDER_SITE_OTHER): Payer: PPO | Admitting: *Deleted

## 2015-12-24 DIAGNOSIS — R55 Syncope and collapse: Secondary | ICD-10-CM

## 2015-12-24 NOTE — Progress Notes (Signed)
Carelink Summary Report / Loop Recorder 

## 2016-01-06 DIAGNOSIS — Z794 Long term (current) use of insulin: Secondary | ICD-10-CM | POA: Diagnosis not present

## 2016-01-06 DIAGNOSIS — H52223 Regular astigmatism, bilateral: Secondary | ICD-10-CM | POA: Diagnosis not present

## 2016-01-06 DIAGNOSIS — H5201 Hypermetropia, right eye: Secondary | ICD-10-CM | POA: Diagnosis not present

## 2016-01-06 DIAGNOSIS — E109 Type 1 diabetes mellitus without complications: Secondary | ICD-10-CM | POA: Diagnosis not present

## 2016-01-23 ENCOUNTER — Ambulatory Visit (INDEPENDENT_AMBULATORY_CARE_PROVIDER_SITE_OTHER): Payer: PPO | Admitting: *Deleted

## 2016-01-23 DIAGNOSIS — R55 Syncope and collapse: Secondary | ICD-10-CM | POA: Diagnosis not present

## 2016-01-26 NOTE — Progress Notes (Signed)
Carelink Summary Report / Loop Recorder 

## 2016-02-05 LAB — CUP PACEART REMOTE DEVICE CHECK: MDC IDC SESS DTM: 20170104120545

## 2016-02-05 NOTE — Progress Notes (Signed)
Carelink summary report received. Battery status OK. Normal device function. No new symptom episodes, tachy episodes, brady, or pause episodes. No new AF episodes. Monthly summary reports and ROV/PRN 

## 2016-02-06 DIAGNOSIS — J440 Chronic obstructive pulmonary disease with acute lower respiratory infection: Secondary | ICD-10-CM | POA: Diagnosis not present

## 2016-02-06 DIAGNOSIS — E119 Type 2 diabetes mellitus without complications: Secondary | ICD-10-CM | POA: Diagnosis not present

## 2016-02-06 DIAGNOSIS — I10 Essential (primary) hypertension: Secondary | ICD-10-CM | POA: Diagnosis not present

## 2016-02-06 DIAGNOSIS — Z6824 Body mass index (BMI) 24.0-24.9, adult: Secondary | ICD-10-CM | POA: Diagnosis not present

## 2016-02-10 ENCOUNTER — Telehealth: Payer: Self-pay | Admitting: Cardiovascular Disease

## 2016-02-10 MED ORDER — METOPROLOL SUCCINATE ER 25 MG PO TB24: 25.0000 mg | ORAL_TABLET | Freq: Every day | ORAL | Status: AC

## 2016-02-10 NOTE — Telephone Encounter (Signed)
Contacted PCP office to find out why dose was changed. Per Amy, nurse for [facility], toprol xl 25 mg is listed for daily dosing instead of half tablet daily.

## 2016-02-10 NOTE — Telephone Encounter (Signed)
EARLY MORNING BEST TIME TO REACH PATIENT  Her PCP wanting her to change her metoprolol succinate (TOPROL-XL) 25 MG 24 hr tablet to half a tablet a day instead of one tablet.  She said she does not feel she should do this since she is doing so well with it

## 2016-02-10 NOTE — Telephone Encounter (Signed)
Patient advised to remain on whole tablet and new prescription sent to [facility] per patient request.

## 2016-02-23 ENCOUNTER — Ambulatory Visit (INDEPENDENT_AMBULATORY_CARE_PROVIDER_SITE_OTHER): Payer: PPO | Admitting: *Deleted

## 2016-02-23 DIAGNOSIS — R55 Syncope and collapse: Secondary | ICD-10-CM | POA: Diagnosis not present

## 2016-02-24 LAB — CUP PACEART REMOTE DEVICE CHECK: Date Time Interrogation Session: 20170203123530

## 2016-02-24 NOTE — Progress Notes (Signed)
Carelink summary report received. Battery status OK. Normal device function. No new symptom episodes, tachy episodes, brady, or pause episodes. No new AF episodes. Monthly summary reports and ROV/PRN 

## 2016-02-26 NOTE — Progress Notes (Signed)
Carelink Summary Report / Loop Recorder 

## 2016-02-28 LAB — CUP PACEART REMOTE DEVICE CHECK: Date Time Interrogation Session: 20170305130522

## 2016-02-28 NOTE — Progress Notes (Signed)
Carelink summary report received. Battery status OK. Normal device function. No new symptom episodes, tachy episodes, brady, or pause episodes. No new AF episodes. Monthly summary reports and ROV/PRN 

## 2016-03-11 ENCOUNTER — Ambulatory Visit (INDEPENDENT_AMBULATORY_CARE_PROVIDER_SITE_OTHER): Payer: PPO | Admitting: Cardiovascular Disease

## 2016-03-11 ENCOUNTER — Encounter: Payer: Self-pay | Admitting: Cardiovascular Disease

## 2016-03-11 VITALS — BP 134/72 | HR 69 | Ht 63.0 in | Wt 135.0 lb

## 2016-03-11 DIAGNOSIS — I480 Paroxysmal atrial fibrillation: Secondary | ICD-10-CM

## 2016-03-11 DIAGNOSIS — Z952 Presence of prosthetic heart valve: Secondary | ICD-10-CM

## 2016-03-11 DIAGNOSIS — Z954 Presence of other heart-valve replacement: Secondary | ICD-10-CM

## 2016-03-11 DIAGNOSIS — I5032 Chronic diastolic (congestive) heart failure: Secondary | ICD-10-CM | POA: Diagnosis not present

## 2016-03-11 DIAGNOSIS — I099 Rheumatic heart disease, unspecified: Secondary | ICD-10-CM

## 2016-03-11 DIAGNOSIS — R55 Syncope and collapse: Secondary | ICD-10-CM

## 2016-03-11 NOTE — Progress Notes (Signed)
Patient ID: [name], female   DOB: [birth year], 82 y.o.   MRN: [id number]      SUBJECTIVE: The patient presents for routine follow-up. Her past medical history is significant for recurrent unexplained syncope, bioprosthetic Medtronic mitral valve (implanted on 05-23-2009), s/p PFO closure, atrial fibrillation s/p Cox-Maze procedure, chronic diastolic heart failure, COPD, and hypothryoidism.  She has an implantable loop recorder. Recent device interrogation did not demonstrate any bradyarrhythmias or pauses, nor tachycardia or atrial fibrillation.  She is feeling well and denies leg swelling and shortness of breath.  Review of Systems: As per "subjective", otherwise negative.  Allergies  Allergen Reactions  . Sulfonamide Derivatives Anaphylaxis, Shortness Of Breath and Swelling  . Aspirin     Uncoated Aspirin = stomach upset/pain EC Aspirin is ok    Current Outpatient Prescriptions  Medication Sig Dispense Refill  . alendronate (FOSAMAX) 70 MG tablet Take 1 tablet by mouth once a week. Takes on Thursday    . calcium-vitamin D (OSCAL WITH D) 500-200 MG-UNIT tablet Take 1 tablet by mouth 2 (two) times daily.    . Cholecalciferol (VITAMIN D3) 2000 UNITS TABS Take 1 tablet by mouth 2 (two) times daily.     Marland Kitchen CRANBERRY FRUIT CONCENTRATE PO Take 1 capsule by mouth 2 (two) times daily.     . diphenoxylate-atropine (LOMOTIL) 2.5-0.025 MG per tablet Take 1 tablet by mouth 4 (four) times daily as needed for diarrhea or loose stools.     . Hydrocodone-Acetaminophen 5-300 MG TABS Take 1 tablet by mouth as needed (for pain). Qd prn    . hydroxypropyl methylcellulose / hypromellose (ISOPTO TEARS / GONIOVISC) 2.5 % ophthalmic solution Place 1 drop into both eyes as needed for dry eyes.    . insulin NPH (HUMULIN N) 100 UNIT/ML injection Inject 40 Units into the skin daily.     Marland Kitchen levothyroxine (SYNTHROID, LEVOTHROID) 100 MCG tablet Take 100 mcg by mouth daily before breakfast.     . LORazepam  (ATIVAN) 0.5 MG tablet Take 0.5 mg by mouth at bedtime as needed for anxiety or sleep.     . metoprolol succinate (TOPROL-XL) 25 MG 24 hr tablet Take 1 tablet (25 mg total) by mouth daily. 90 tablet 3  . Multiple Vitamin (MULTIVITAMIN) tablet Take 1 tablet by mouth daily.      . nitroGLYCERIN (NITROSTAT) 0.4 MG SL tablet Place 0.4 mg under the tongue every 5 (five) minutes x 3 doses as needed for chest pain.    . Omega-3 Fatty Acids (FISH OIL) 1200 MG CAPS Take 1 capsule by mouth 2 (two) times daily.     . potassium chloride (KLOR-CON) 10 MEQ CR tablet Take 10 mEq by mouth daily.      . ranitidine (ZANTAC) 300 MG capsule Take 300 mg by mouth 2 (two) times daily.      . [DISCONTINUED] metoprolol tartrate (LOPRESSOR) 25 MG tablet Take 12.5 mg by mouth daily. 45 tablet 6  . [DISCONTINUED] traZODone (DESYREL) 50 MG tablet May take 1/2 tab (25mg ) as needed for insomnia.  May increase to one tab (50mg ) if needed. 30 tablet 1   No current facility-administered medications for this visit.    Past Medical History  Diagnosis Date  . Rheumatic heart disease     s/p right thoracotomy and mitral valve replacement with Medtronic Mosaic porcine tissue valve  . CHF (congestive heart failure) (HCC)     normal LV systolic function  . Degenerative lumbar spinal stenosis     Severe  degenerative disk disease of lumbosacral spine  . Hypothyroidism   . COPD (chronic obstructive pulmonary disease) (HCC)   . GERD (gastroesophageal reflux disease)   . Arrhythmia     Sinus tachycardia  . Atrial fibrillation (HCC)     status post Cox-Maze procedure maintaining normal sinus rhythm, amiodarone and Coumadin discontinued.  Marland Kitchen PFO (patent foramen ovale)     status post PFO closure at time of surgery  . Syncope     recurrent unexplained syncope  . Diabetes [facility])     Past Surgical History  Procedure Laterality Date  . Exploratory laparotomy    . Foot surgery      left foot  . Percutaneous balloon valvuloplasty   1993    Mitral valve  . Mitral valve replacement    . Ep implantable device N/A 06/27/2015    MDT LINQ ILR implanted by Dr Johney Frame for unexplained syncope    Social History   Social History  . Marital Status: Widowed    Spouse Name: N/A  . Number of Children: N/A  . Years of Education: N/A   Occupational History  . Not on file.   Social History Main Topics  . Smoking status: Former Smoker -- 0.30 packs/day for 18 years    Types: Cigarettes    Start date: 05/26/1977    Quit date: 12/20/1988  . Smokeless tobacco: Never Used  . Alcohol Use: No  . Drug Use: No  . Sexual Activity: Not on file   Other Topics Concern  . Not on file   Social History Narrative   Lives in [place] Vitals:   03/11/16 0805  BP: 134/72  Pulse: 69  Height: 5\' 3"  (1.6 m)  Weight: 135 lb (61.236 kg)    PHYSICAL EXAM General: NAD HEENT: Normal. Neck: No JVD, no thyromegaly. Lungs: Clear to auscultation bilaterally with normal respiratory effort. CV: Nondisplaced PMI.  Regular rate and rhythm, normal S1/S2, no S3/S4, no murmur. No pretibial or periankle edema. Wearing compression stockings.   Abdomen: Soft, nontender, no distention.  Neurologic: Alert and oriented.  Psych: Normal affect. Skin: Normal. Musculoskeletal: No gross deformities.  ECG: Most recent ECG reviewed.      ASSESSMENT AND PLAN: 1. Syncope: No recurrences. No sinus pauses or arrhythmias with loop recorder thus far.   2. Rheumatic mitral disease s/p bioprosthetic valve replacement: Symptomatically stable. Most recent echocardiogram from 03/2015 showed normal function. Continue to monitor.   3. Atrial fibrillation s/p Cox-Maze: Remains in a regular rhythm. No changes.  4. Chronic diastolic heart failure: BP is well controlled and pt is euvolemic.   Dispo: f/u 6 months.   04/2015, M.D., F.A.C.C.

## 2016-03-11 NOTE — Patient Instructions (Signed)
Continue all current medications. Your physician wants you to follow up in: 6 months.  You will receive a reminder letter in the mail one-two months in advance.  If you don't receive a letter, please call our office to schedule the follow up appointment   

## 2016-03-15 ENCOUNTER — Telehealth: Payer: Self-pay | Admitting: Cardiology

## 2016-03-15 NOTE — Telephone Encounter (Signed)
Spoke w/ pt and requested that she send a manual transmission w/ her home monitor. Instructed pt how to do this. Transmission received and informed pt that it was.

## 2016-03-22 ENCOUNTER — Ambulatory Visit (INDEPENDENT_AMBULATORY_CARE_PROVIDER_SITE_OTHER): Payer: PPO | Admitting: *Deleted

## 2016-03-22 DIAGNOSIS — R55 Syncope and collapse: Secondary | ICD-10-CM

## 2016-03-23 NOTE — Progress Notes (Signed)
Carelink Summary Report / Loop Recorder 

## 2016-04-22 ENCOUNTER — Ambulatory Visit (INDEPENDENT_AMBULATORY_CARE_PROVIDER_SITE_OTHER): Payer: PPO | Admitting: *Deleted

## 2016-04-22 DIAGNOSIS — R55 Syncope and collapse: Secondary | ICD-10-CM

## 2016-04-22 NOTE — Progress Notes (Signed)
Carelink Summary Report / Loop Recorder 

## 2016-05-10 DIAGNOSIS — Z6824 Body mass index (BMI) 24.0-24.9, adult: Secondary | ICD-10-CM | POA: Diagnosis not present

## 2016-05-10 DIAGNOSIS — I10 Essential (primary) hypertension: Secondary | ICD-10-CM | POA: Diagnosis not present

## 2016-05-10 DIAGNOSIS — I5032 Chronic diastolic (congestive) heart failure: Secondary | ICD-10-CM | POA: Diagnosis not present

## 2016-05-10 DIAGNOSIS — J440 Chronic obstructive pulmonary disease with acute lower respiratory infection: Secondary | ICD-10-CM | POA: Diagnosis not present

## 2016-05-10 DIAGNOSIS — E119 Type 2 diabetes mellitus without complications: Secondary | ICD-10-CM | POA: Diagnosis not present

## 2016-05-10 DIAGNOSIS — E038 Other specified hypothyroidism: Secondary | ICD-10-CM | POA: Diagnosis not present

## 2016-05-10 DIAGNOSIS — I700 Atherosclerosis of aorta: Secondary | ICD-10-CM | POA: Diagnosis not present

## 2016-05-15 LAB — CUP PACEART REMOTE DEVICE CHECK: Date Time Interrogation Session: 20170404130613

## 2016-05-15 NOTE — Progress Notes (Signed)
Carelink summary report received. Battery status OK. Normal device function. No new symptom episodes, tachy episodes, brady, or pause episodes. No new AF episodes. Monthly summary reports and ROV/PRN 

## 2016-05-24 ENCOUNTER — Ambulatory Visit (INDEPENDENT_AMBULATORY_CARE_PROVIDER_SITE_OTHER): Payer: PPO | Admitting: *Deleted

## 2016-05-24 DIAGNOSIS — R55 Syncope and collapse: Secondary | ICD-10-CM

## 2016-05-24 NOTE — Progress Notes (Signed)
Carelink Summary Report / Loop Recorder 

## 2016-05-30 LAB — CUP PACEART REMOTE DEVICE CHECK: Date Time Interrogation Session: 20170504133607

## 2016-05-30 NOTE — Progress Notes (Signed)
Carelink summary report received. Battery status OK. Normal device function. No new symptom episodes, tachy episodes, brady, or pause episodes. No new AF episodes. Monthly summary reports and ROV/PRN 

## 2016-06-15 DIAGNOSIS — E119 Type 2 diabetes mellitus without complications: Secondary | ICD-10-CM | POA: Diagnosis not present

## 2016-06-21 ENCOUNTER — Ambulatory Visit (INDEPENDENT_AMBULATORY_CARE_PROVIDER_SITE_OTHER): Payer: PPO | Admitting: *Deleted

## 2016-06-21 DIAGNOSIS — R55 Syncope and collapse: Secondary | ICD-10-CM | POA: Diagnosis not present

## 2016-06-21 NOTE — Progress Notes (Signed)
Carelink Summary Report / Loop Recorder 

## 2016-06-23 LAB — CUP PACEART REMOTE DEVICE CHECK: MDC IDC SESS DTM: 20170603140511

## 2016-07-06 LAB — CUP PACEART REMOTE DEVICE CHECK: MDC IDC SESS DTM: 20170703140705

## 2016-07-21 ENCOUNTER — Ambulatory Visit (INDEPENDENT_AMBULATORY_CARE_PROVIDER_SITE_OTHER): Payer: PPO | Admitting: *Deleted

## 2016-07-21 DIAGNOSIS — R55 Syncope and collapse: Secondary | ICD-10-CM | POA: Diagnosis not present

## 2016-07-21 NOTE — Progress Notes (Signed)
Carelink Summary Report / Loop Recorder 

## 2016-08-02 LAB — CUP PACEART REMOTE DEVICE CHECK: MDC IDC SESS DTM: 20170802150613

## 2016-08-09 DIAGNOSIS — E038 Other specified hypothyroidism: Secondary | ICD-10-CM | POA: Diagnosis not present

## 2016-08-09 DIAGNOSIS — I700 Atherosclerosis of aorta: Secondary | ICD-10-CM | POA: Diagnosis not present

## 2016-08-09 DIAGNOSIS — I10 Essential (primary) hypertension: Secondary | ICD-10-CM | POA: Diagnosis not present

## 2016-08-09 DIAGNOSIS — Z6825 Body mass index (BMI) 25.0-25.9, adult: Secondary | ICD-10-CM | POA: Diagnosis not present

## 2016-08-09 DIAGNOSIS — I5032 Chronic diastolic (congestive) heart failure: Secondary | ICD-10-CM | POA: Diagnosis not present

## 2016-08-09 DIAGNOSIS — J440 Chronic obstructive pulmonary disease with acute lower respiratory infection: Secondary | ICD-10-CM | POA: Diagnosis not present

## 2016-08-09 DIAGNOSIS — E119 Type 2 diabetes mellitus without complications: Secondary | ICD-10-CM | POA: Diagnosis not present

## 2016-08-16 ENCOUNTER — Telehealth: Payer: Self-pay | Admitting: Internal Medicine

## 2016-08-16 NOTE — Telephone Encounter (Signed)
Mrs. Suhre called the office stating that she feels like she is having a problem with her recorder. States it is not moving. Tried to contact [place] office.

## 2016-08-16 NOTE — Telephone Encounter (Signed)
Pt has called back requesting someone contact her.

## 2016-08-16 NOTE — Telephone Encounter (Signed)
Spoke with patient.  She had two brief episodes of sharp chest pain, ~1 min duration, each resolved with nitro SL x1 over the weekend.  Patient is also concerned that she can not move her ILR in her chest.  Advised patient that the ILR is in place to identify arrhythmias and is not able to identify chest pain or related symptoms.  Also advised patient that her ILR should be healed in place at this point and she should not be able to move it.  ILR has not shown any rhythm abnormalities through today.  Patient verbalizes understanding.  Advised patient to call Dr. Junius Argyle office or seek emergency medical attention if she develops new or worsening symptoms, including chest pain that does not resolve with her prescribed nitro.  Patient verbalizes understanding of instructions and denies additional questions or concerns at this time.

## 2016-08-16 NOTE — Telephone Encounter (Signed)
Returned call. No VM set up.  Daily LINQ transmissions have been received.

## 2016-08-16 NOTE — Telephone Encounter (Signed)
Will forward to device

## 2016-08-20 ENCOUNTER — Ambulatory Visit (INDEPENDENT_AMBULATORY_CARE_PROVIDER_SITE_OTHER): Payer: PPO | Admitting: *Deleted

## 2016-08-20 DIAGNOSIS — R55 Syncope and collapse: Secondary | ICD-10-CM | POA: Diagnosis not present

## 2016-08-20 NOTE — Progress Notes (Signed)
Carelink Summary Report / Loop Recorder 

## 2016-09-09 ENCOUNTER — Ambulatory Visit (INDEPENDENT_AMBULATORY_CARE_PROVIDER_SITE_OTHER): Payer: PPO | Admitting: Cardiovascular Disease

## 2016-09-09 ENCOUNTER — Encounter: Payer: Self-pay | Admitting: Cardiovascular Disease

## 2016-09-09 VITALS — BP 132/80 | HR 75 | Ht 62.0 in | Wt 139.0 lb

## 2016-09-09 DIAGNOSIS — Z954 Presence of other heart-valve replacement: Secondary | ICD-10-CM

## 2016-09-09 DIAGNOSIS — I5032 Chronic diastolic (congestive) heart failure: Secondary | ICD-10-CM

## 2016-09-09 DIAGNOSIS — R55 Syncope and collapse: Secondary | ICD-10-CM | POA: Diagnosis not present

## 2016-09-09 DIAGNOSIS — Z952 Presence of prosthetic heart valve: Secondary | ICD-10-CM

## 2016-09-09 DIAGNOSIS — I099 Rheumatic heart disease, unspecified: Secondary | ICD-10-CM

## 2016-09-09 DIAGNOSIS — I480 Paroxysmal atrial fibrillation: Secondary | ICD-10-CM | POA: Diagnosis not present

## 2016-09-09 NOTE — Patient Instructions (Signed)

## 2016-09-09 NOTE — Progress Notes (Signed)
SUBJECTIVE: The patient presents for routine follow-up. Her past medical history is significant for recurrent unexplained syncope, bioprosthetic Medtronic mitral valve (implanted on 05-23-2009), s/p PFO closure, atrial fibrillation s/p Cox-Maze procedure, chronic diastolic heart failure, COPD, and hypothryoidism.   She has an implantable loop recorder. Recent device interrogation did not demonstrate any bradyarrhythmias or pauses, nor tachycardia or atrial fibrillation.  She is feeling well and denies leg swelling and shortness of breath.  ECG performed in the office today which I personally reviewed demonstrates normal sinus rhythm with no ischemic ST segment or T-wave abnormalities, nor any arrhythmias.    Review of Systems: As per "subjective", otherwise negative.  Allergies  Allergen Reactions  . Sulfonamide Derivatives Anaphylaxis, Shortness Of Breath and Swelling  . Aspirin     Uncoated Aspirin = stomach upset/pain EC Aspirin is ok    Current Outpatient Prescriptions  Medication Sig Dispense Refill  . calcium-vitamin D (OSCAL WITH D) 500-200 MG-UNIT tablet Take 1 tablet by mouth 2 (two) times daily.    . Cholecalciferol (VITAMIN D3) 2000 UNITS TABS Take 1 tablet by mouth 2 (two) times daily.     Marland Kitchen CRANBERRY FRUIT CONCENTRATE PO Take 1 capsule by mouth 2 (two) times daily.     . diphenoxylate-atropine (LOMOTIL) 2.5-0.025 MG per tablet Take 1 tablet by mouth 4 (four) times daily as needed for diarrhea or loose stools.     . hydroxypropyl methylcellulose / hypromellose (ISOPTO TEARS / GONIOVISC) 2.5 % ophthalmic solution Place 1 drop into both eyes as needed for dry eyes.    . insulin NPH (HUMULIN N) 100 UNIT/ML injection Inject 40 Units into the skin daily.     Marland Kitchen levothyroxine (SYNTHROID, LEVOTHROID) 100 MCG tablet Take 100 mcg by mouth daily before breakfast.     . LORazepam (ATIVAN) 0.5 MG tablet Take 0.5 mg by mouth at bedtime as needed for anxiety or sleep.     .  metoprolol succinate (TOPROL-XL) 25 MG 24 hr tablet Take 1 tablet (25 mg total) by mouth daily. 90 tablet 3  . Multiple Vitamin (MULTIVITAMIN) tablet Take 1 tablet by mouth daily.      . nitroGLYCERIN (NITROSTAT) 0.4 MG SL tablet Place 0.4 mg under the tongue every 5 (five) minutes x 3 doses as needed for chest pain.    . Omega-3 Fatty Acids (FISH OIL) 1200 MG CAPS Take 1 capsule by mouth 2 (two) times daily.     . potassium chloride (KLOR-CON) 10 MEQ CR tablet Take 10 mEq by mouth daily.      . ranitidine (ZANTAC) 300 MG capsule Take 300 mg by mouth 2 (two) times daily.       No current facility-administered medications for this visit.     Past Medical History:  Diagnosis Date  . Arrhythmia    Sinus tachycardia  . Atrial fibrillation (HCC)    status post Cox-Maze procedure maintaining normal sinus rhythm, amiodarone and Coumadin discontinued.  . CHF (congestive heart failure) (HCC)    normal LV systolic function  . COPD (chronic obstructive pulmonary disease) (HCC)   . Degenerative lumbar spinal stenosis    Severe degenerative disk disease of lumbosacral spine  . Diabetes (HCC)   . GERD (gastroesophageal reflux disease)   . Hypothyroidism   . PFO (patent foramen ovale)    status post PFO closure at time of surgery  . Rheumatic heart disease    s/p right thoracotomy and mitral valve replacement with Medtronic Mosaic porcine tissue valve  .  Syncope    recurrent unexplained syncope    Past Surgical History:  Procedure Laterality Date  . EP IMPLANTABLE DEVICE N/A 06/27/2015   MDT LINQ ILR implanted by Dr Johney Frame for unexplained syncope  . EXPLORATORY LAPAROTOMY    . FOOT SURGERY     left foot  . MITRAL VALVE REPLACEMENT    . PERCUTANEOUS BALLOON VALVULOPLASTY  1993   Mitral valve    Social History   Social History  . Marital status: Widowed    Spouse name: N/A  . Number of children: N/A  . Years of education: N/A   Occupational History  . Not on file.   Social History  Main Topics  . Smoking status: Former Smoker    Packs/day: 0.30    Years: 18.00    Types: Cigarettes    Start date: 05/26/1977    Quit date: 12/20/1988  . Smokeless tobacco: Never Used  . Alcohol use No  . Drug use: No  . Sexual activity: Not on file   Other Topics Concern  . Not on file   Social History Narrative   Lives in [place]     Vitals:   09/09/16 0823  Weight: 139 lb (63 kg)  Height: 5\' 2"  (1.575 m)    PHYSICAL EXAM General: NAD HEENT: Normal. Neck: No JVD, no thyromegaly. Lungs: Clear to auscultation bilaterally with normal respiratory effort. CV: Nondisplaced PMI.  Regular rate and rhythm, normal S1/S2, no S3/S4, no murmur. No pretibial or periankle edema.  Wearing compression stockings.     Abdomen: Soft, nontender, no distention.  Neurologic: Alert and oriented.  Psych: Normal affect. Skin: Normal. Musculoskeletal: No gross deformities.    ECG: Most recent ECG reviewed.      ASSESSMENT AND PLAN: 1. Syncope: No recurrences. No sinus pauses or arrhythmias with loop recorder thus far. Prior episodes may have been hypoglycemic in nature.  2. Rheumatic mitral disease s/p bioprosthetic valve replacement: Symptomatically stable. Most recent echocardiogram from 03/2015 showed normal function. Continue to monitor.   3. Atrial fibrillation s/p Cox-Maze: Remains in sinus rhythm. No changes.  4. Chronic diastolic heart failure: BP is well controlled and pt is euvolemic.    Dispo: f/u 1 year   04/2015, M.D., F.A.C.C.

## 2016-09-13 LAB — CUP PACEART REMOTE DEVICE CHECK: Date Time Interrogation Session: 20170901153835

## 2016-09-18 DIAGNOSIS — E119 Type 2 diabetes mellitus without complications: Secondary | ICD-10-CM | POA: Diagnosis not present

## 2016-09-20 ENCOUNTER — Ambulatory Visit (INDEPENDENT_AMBULATORY_CARE_PROVIDER_SITE_OTHER): Payer: PPO | Admitting: *Deleted

## 2016-09-20 DIAGNOSIS — R55 Syncope and collapse: Secondary | ICD-10-CM | POA: Diagnosis not present

## 2016-09-20 NOTE — Progress Notes (Signed)
Carelink Summary Report / Loop Recorder 

## 2016-09-29 ENCOUNTER — Encounter: Payer: Self-pay | Admitting: *Deleted

## 2016-09-29 ENCOUNTER — Telehealth: Payer: Self-pay | Admitting: Cardiovascular Disease

## 2016-09-29 NOTE — Telephone Encounter (Signed)
Letter created (see epic) & patient notified to pick up.

## 2016-09-29 NOTE — Telephone Encounter (Signed)
Patient called requesting a letter stating she needs railings installed in her bathtub due to her health. Believe needed for where she lives for the to be able to complete for her.

## 2016-09-29 NOTE — Telephone Encounter (Signed)
Fwd to Dr. Purvis Sheffield for approval.

## 2016-09-29 NOTE — Telephone Encounter (Signed)
That would be fine 

## 2016-10-19 ENCOUNTER — Ambulatory Visit (INDEPENDENT_AMBULATORY_CARE_PROVIDER_SITE_OTHER): Payer: PPO | Admitting: *Deleted

## 2016-10-19 DIAGNOSIS — R55 Syncope and collapse: Secondary | ICD-10-CM | POA: Diagnosis not present

## 2016-10-20 NOTE — Progress Notes (Signed)
Carelink Summary Report / Loop Recorder 

## 2016-10-22 ENCOUNTER — Encounter: Payer: Self-pay | Admitting: Internal Medicine

## 2016-10-22 ENCOUNTER — Ambulatory Visit (INDEPENDENT_AMBULATORY_CARE_PROVIDER_SITE_OTHER): Payer: PPO | Admitting: Internal Medicine

## 2016-10-22 VITALS — BP 130/67 | HR 72 | Ht 62.0 in | Wt 137.6 lb

## 2016-10-22 DIAGNOSIS — I480 Paroxysmal atrial fibrillation: Secondary | ICD-10-CM

## 2016-10-22 DIAGNOSIS — Z952 Presence of prosthetic heart valve: Secondary | ICD-10-CM | POA: Diagnosis not present

## 2016-10-22 DIAGNOSIS — R55 Syncope and collapse: Secondary | ICD-10-CM

## 2016-10-22 LAB — CUP PACEART INCLINIC DEVICE CHECK
Implantable Pulse Generator Implant Date: 20160708
MDC IDC SESS DTM: 20171103122445

## 2016-10-22 NOTE — Progress Notes (Signed)
PCP: Toma Deiters, MD Primary Cardiologist:  Dr Valaria Good is a 82 y.o. female who presents today for routine electrophysiology followup.  Since her ILR implant, the patient reports doing very well.  She has had no further syncope since reducing her nighttime insulin over a year ago.  Remains active for her age.   Today, she denies symptoms of palpitations, chest pain, shortness of breath,  lower extremity edema, dizziness, presyncope, or syncope.  The patient is otherwise without complaint today.   Past Medical History:  Diagnosis Date  . Arrhythmia    Sinus tachycardia  . Atrial fibrillation (HCC)    status post Cox-Maze procedure maintaining normal sinus rhythm, amiodarone and Coumadin discontinued.  . CHF (congestive heart failure) (HCC)    normal LV systolic function  . COPD (chronic obstructive pulmonary disease) (HCC)   . Degenerative lumbar spinal stenosis    Severe degenerative disk disease of lumbosacral spine  . Diabetes (HCC)   . GERD (gastroesophageal reflux disease)   . Hypothyroidism   . PFO (patent foramen ovale)    status post PFO closure at time of surgery  . Rheumatic heart disease    s/p right thoracotomy and mitral valve replacement with Medtronic Mosaic porcine tissue valve  . Syncope    recurrent unexplained syncope   Past Surgical History:  Procedure Laterality Date  . EP IMPLANTABLE DEVICE N/A 06/27/2015   MDT LINQ ILR implanted by Dr Johney Frame for unexplained syncope  . EXPLORATORY LAPAROTOMY    . FOOT SURGERY     left foot  . MITRAL VALVE REPLACEMENT    . PERCUTANEOUS BALLOON VALVULOPLASTY  1993   Mitral valve    ROS- all systems are reviewed and negatives except as per HPI above  Current Outpatient Prescriptions  Medication Sig Dispense Refill  . calcium-vitamin D (OSCAL WITH D) 500-200 MG-UNIT tablet Take 1 tablet by mouth 2 (two) times daily.    . Cholecalciferol (VITAMIN D3) 2000 UNITS TABS Take 1 tablet by mouth 2 (two)  times daily.     Marland Kitchen CRANBERRY FRUIT CONCENTRATE PO Take 1 capsule by mouth 2 (two) times daily.     . diphenoxylate-atropine (LOMOTIL) 2.5-0.025 MG per tablet Take 1 tablet by mouth 4 (four) times daily as needed for diarrhea or loose stools.     . hydroxypropyl methylcellulose / hypromellose (ISOPTO TEARS / GONIOVISC) 2.5 % ophthalmic solution Place 1 drop into both eyes as needed for dry eyes.    . insulin NPH (HUMULIN N) 100 UNIT/ML injection Inject 40 Units into the skin daily.     Marland Kitchen levothyroxine (SYNTHROID, LEVOTHROID) 100 MCG tablet Take 100 mcg by mouth daily before breakfast.     . LORazepam (ATIVAN) 0.5 MG tablet Take 0.5 mg by mouth at bedtime as needed for anxiety or sleep.     . metoprolol succinate (TOPROL-XL) 25 MG 24 hr tablet Take 1 tablet (25 mg total) by mouth daily. 90 tablet 3  . Multiple Vitamin (MULTIVITAMIN) tablet Take 1 tablet by mouth daily.      . nitroGLYCERIN (NITROSTAT) 0.4 MG SL tablet Place 0.4 mg under the tongue every 5 (five) minutes x 3 doses as needed for chest pain.    . Omega-3 Fatty Acids (FISH OIL) 1200 MG CAPS Take 1 capsule by mouth 2 (two) times daily.     . potassium chloride (KLOR-CON) 10 MEQ CR tablet Take 10 mEq by mouth daily.      . ranitidine (ZANTAC) 300  MG capsule Take 300 mg by mouth 2 (two) times daily.       No current facility-administered medications for this visit.     Physical Exam: Vitals:   10/22/16 0829  BP: 130/67  Pulse: 72  SpO2: 99%  Weight: 137 lb 9.6 oz (62.4 kg)  Height: 5\' 2"  (1.575 m)    GEN- The patient is well appearing, alert and oriented x 3 today.   Head- normocephalic, atraumatic Eyes-  Sclera clear, conjunctiva pink Ears- hearing intact Oropharynx- clear Lungs- Clear to ausculation bilaterally, normal work of breathing Heart- Regular rate and rhythm, no murmurs, rubs or gallops, PMI not laterally displaced GI- soft, NT, ND, + BS Extremities- no clubbing, cyanosis, or edema  ILR interrogation reveals no  arrhythmias  Assessment and Plan:  1. Recurrent unexplained syncope Unclear etiology ILR reveals no arrhythmias Will continue to follow remotely with care[facility]  2. afib No afib on ILR interrogation since implant Poor candidate for anticoagulation presently though we may reconsider if she ever demonstrates a high AF burden  Follow-up with Dr as scheduled I will follow remotely and see as needed in the office going forward.  Purvis Sheffield MD, [facility] 10/22/2016 9:13 AM

## 2016-10-22 NOTE — Patient Instructions (Signed)
Medication Instructions:  Continue all current medications.  Labwork: none  Testing/Procedures: none  Follow-Up: As needed.    Any Other Special Instructions Will Be Listed Below (If Applicable).  If you need a refill on your cardiac medications before your next appointment, please call your pharmacy.  

## 2016-10-30 LAB — CUP PACEART REMOTE DEVICE CHECK
Implantable Pulse Generator Implant Date: 20160708
MDC IDC SESS DTM: 20171001174010

## 2016-10-30 NOTE — Progress Notes (Signed)
Carelink summary report received. Battery status OK. Normal device function. No new symptom episodes, tachy episodes, brady, or pause episodes. No new AF episodes. Monthly summary reports and ROV/PRN 

## 2016-11-10 DIAGNOSIS — E119 Type 2 diabetes mellitus without complications: Secondary | ICD-10-CM | POA: Diagnosis not present

## 2016-11-10 DIAGNOSIS — I10 Essential (primary) hypertension: Secondary | ICD-10-CM | POA: Diagnosis not present

## 2016-11-10 DIAGNOSIS — Z0000 Encounter for general adult medical examination without abnormal findings: Secondary | ICD-10-CM | POA: Diagnosis not present

## 2016-11-10 DIAGNOSIS — I700 Atherosclerosis of aorta: Secondary | ICD-10-CM | POA: Diagnosis not present

## 2016-11-10 DIAGNOSIS — J440 Chronic obstructive pulmonary disease with acute lower respiratory infection: Secondary | ICD-10-CM | POA: Diagnosis not present

## 2016-11-10 DIAGNOSIS — Z1389 Encounter for screening for other disorder: Secondary | ICD-10-CM | POA: Diagnosis not present

## 2016-11-10 DIAGNOSIS — Z6825 Body mass index (BMI) 25.0-25.9, adult: Secondary | ICD-10-CM | POA: Diagnosis not present

## 2016-11-10 DIAGNOSIS — E038 Other specified hypothyroidism: Secondary | ICD-10-CM | POA: Diagnosis not present

## 2016-11-10 DIAGNOSIS — I5032 Chronic diastolic (congestive) heart failure: Secondary | ICD-10-CM | POA: Diagnosis not present

## 2016-11-18 ENCOUNTER — Ambulatory Visit (INDEPENDENT_AMBULATORY_CARE_PROVIDER_SITE_OTHER): Payer: PPO | Admitting: *Deleted

## 2016-11-18 DIAGNOSIS — R55 Syncope and collapse: Secondary | ICD-10-CM

## 2016-11-18 NOTE — Progress Notes (Signed)
Carelink Summary Report / Loop Recorder 

## 2016-11-27 LAB — CUP PACEART REMOTE DEVICE CHECK
Implantable Pulse Generator Implant Date: 20160708
MDC IDC SESS DTM: 20171031173914

## 2016-11-27 NOTE — Progress Notes (Signed)
Carelink summary report received. Battery status OK. Normal device function. No new symptom episodes, tachy episodes, brady, or pause episodes. No new AF episodes. Monthly summary reports and ROV/PRN 

## 2016-12-21 ENCOUNTER — Ambulatory Visit (INDEPENDENT_AMBULATORY_CARE_PROVIDER_SITE_OTHER): Payer: PPO | Admitting: *Deleted

## 2016-12-21 DIAGNOSIS — R55 Syncope and collapse: Secondary | ICD-10-CM | POA: Diagnosis not present

## 2016-12-21 NOTE — Progress Notes (Signed)
carelink summary report 

## 2016-12-23 DIAGNOSIS — E119 Type 2 diabetes mellitus without complications: Secondary | ICD-10-CM | POA: Diagnosis not present

## 2016-12-29 LAB — CUP PACEART REMOTE DEVICE CHECK
Date Time Interrogation Session: 20171130184228
Implantable Pulse Generator Implant Date: 20160708

## 2017-01-17 ENCOUNTER — Ambulatory Visit (INDEPENDENT_AMBULATORY_CARE_PROVIDER_SITE_OTHER): Payer: PPO | Admitting: *Deleted

## 2017-01-17 DIAGNOSIS — R55 Syncope and collapse: Secondary | ICD-10-CM

## 2017-01-18 NOTE — Progress Notes (Signed)
Carelink Summary Report / Loop Recorder 

## 2017-02-05 LAB — CUP PACEART REMOTE DEVICE CHECK
Implantable Pulse Generator Implant Date: 20160708
MDC IDC SESS DTM: 20171230190955

## 2017-02-05 NOTE — Progress Notes (Signed)
Carelink summary report received. Battery status OK. Normal device function. No new symptom episodes, tachy episodes, brady, or pause episodes. No new AF episodes. Monthly summary reports and ROV/PRN 

## 2017-02-15 DIAGNOSIS — J440 Chronic obstructive pulmonary disease with acute lower respiratory infection: Secondary | ICD-10-CM | POA: Diagnosis not present

## 2017-02-15 DIAGNOSIS — E038 Other specified hypothyroidism: Secondary | ICD-10-CM | POA: Diagnosis not present

## 2017-02-15 DIAGNOSIS — I5032 Chronic diastolic (congestive) heart failure: Secondary | ICD-10-CM | POA: Diagnosis not present

## 2017-02-15 DIAGNOSIS — E119 Type 2 diabetes mellitus without complications: Secondary | ICD-10-CM | POA: Diagnosis not present

## 2017-02-15 DIAGNOSIS — I700 Atherosclerosis of aorta: Secondary | ICD-10-CM | POA: Diagnosis not present

## 2017-02-15 DIAGNOSIS — I10 Essential (primary) hypertension: Secondary | ICD-10-CM | POA: Diagnosis not present

## 2017-02-15 DIAGNOSIS — Z6825 Body mass index (BMI) 25.0-25.9, adult: Secondary | ICD-10-CM | POA: Diagnosis not present

## 2017-02-15 LAB — CUP PACEART REMOTE DEVICE CHECK
Implantable Pulse Generator Implant Date: 20160708
MDC IDC SESS DTM: 20180129223151

## 2017-02-15 NOTE — Progress Notes (Signed)
Carelink summary report received. Battery status OK. Normal device function. No new symptom episodes, tachy episodes, brady, or pause episodes. No new AF episodes. Monthly summary reports and ROV/PRN 

## 2017-02-16 ENCOUNTER — Ambulatory Visit (INDEPENDENT_AMBULATORY_CARE_PROVIDER_SITE_OTHER): Payer: PPO | Admitting: *Deleted

## 2017-02-16 DIAGNOSIS — R55 Syncope and collapse: Secondary | ICD-10-CM

## 2017-02-17 NOTE — Progress Notes (Signed)
Carelink Summary Report / Loop Recorder 

## 2017-02-20 DIAGNOSIS — M5432 Sciatica, left side: Secondary | ICD-10-CM | POA: Diagnosis not present

## 2017-02-20 DIAGNOSIS — E039 Hypothyroidism, unspecified: Secondary | ICD-10-CM | POA: Diagnosis not present

## 2017-02-20 DIAGNOSIS — E119 Type 2 diabetes mellitus without complications: Secondary | ICD-10-CM | POA: Diagnosis not present

## 2017-02-20 DIAGNOSIS — M069 Rheumatoid arthritis, unspecified: Secondary | ICD-10-CM | POA: Diagnosis not present

## 2017-02-20 DIAGNOSIS — I509 Heart failure, unspecified: Secondary | ICD-10-CM | POA: Diagnosis not present

## 2017-02-20 DIAGNOSIS — Z794 Long term (current) use of insulin: Secondary | ICD-10-CM | POA: Diagnosis not present

## 2017-02-20 DIAGNOSIS — M1612 Unilateral primary osteoarthritis, left hip: Secondary | ICD-10-CM | POA: Diagnosis not present

## 2017-02-20 DIAGNOSIS — I110 Hypertensive heart disease with heart failure: Secondary | ICD-10-CM | POA: Diagnosis not present

## 2017-02-20 DIAGNOSIS — M5136 Other intervertebral disc degeneration, lumbar region: Secondary | ICD-10-CM | POA: Diagnosis not present

## 2017-02-20 DIAGNOSIS — Z79899 Other long term (current) drug therapy: Secondary | ICD-10-CM | POA: Diagnosis not present

## 2017-02-20 DIAGNOSIS — M79605 Pain in left leg: Secondary | ICD-10-CM | POA: Diagnosis not present

## 2017-02-20 DIAGNOSIS — Z87891 Personal history of nicotine dependence: Secondary | ICD-10-CM | POA: Diagnosis not present

## 2017-02-20 DIAGNOSIS — K219 Gastro-esophageal reflux disease without esophagitis: Secondary | ICD-10-CM | POA: Diagnosis not present

## 2017-02-20 DIAGNOSIS — J449 Chronic obstructive pulmonary disease, unspecified: Secondary | ICD-10-CM | POA: Diagnosis not present

## 2017-03-04 LAB — CUP PACEART REMOTE DEVICE CHECK
Date Time Interrogation Session: 20180228194207
Implantable Pulse Generator Implant Date: 20160708

## 2017-03-18 ENCOUNTER — Ambulatory Visit (INDEPENDENT_AMBULATORY_CARE_PROVIDER_SITE_OTHER): Payer: PPO | Admitting: *Deleted

## 2017-03-18 DIAGNOSIS — R55 Syncope and collapse: Secondary | ICD-10-CM | POA: Diagnosis not present

## 2017-03-21 NOTE — Progress Notes (Signed)
Carelink Summary Report / Loop Recorder 

## 2017-03-31 LAB — CUP PACEART REMOTE DEVICE CHECK
Date Time Interrogation Session: 20180330200947
MDC IDC PG IMPLANT DT: 20160708

## 2017-04-18 ENCOUNTER — Ambulatory Visit (INDEPENDENT_AMBULATORY_CARE_PROVIDER_SITE_OTHER): Payer: PPO | Admitting: *Deleted

## 2017-04-18 DIAGNOSIS — R55 Syncope and collapse: Secondary | ICD-10-CM | POA: Diagnosis not present

## 2017-04-18 NOTE — Progress Notes (Signed)
Carelink Summary Report / Loop Recorder 

## 2017-05-01 LAB — CUP PACEART REMOTE DEVICE CHECK
Implantable Pulse Generator Implant Date: 20160708
MDC IDC SESS DTM: 20180429204024

## 2017-05-01 NOTE — Progress Notes (Signed)
Carelink summary report received. Battery status OK. Normal device function. No new symptom episodes, tachy episodes, brady, or pause episodes. No new AF episodes. Monthly summary reports and ROV/PRN 

## 2017-05-17 ENCOUNTER — Ambulatory Visit (INDEPENDENT_AMBULATORY_CARE_PROVIDER_SITE_OTHER): Payer: PPO | Admitting: *Deleted

## 2017-05-17 DIAGNOSIS — R55 Syncope and collapse: Secondary | ICD-10-CM

## 2017-05-19 NOTE — Progress Notes (Signed)
[facility] Summary Report 

## 2017-05-20 LAB — CUP PACEART REMOTE DEVICE CHECK
Date Time Interrogation Session: 20180529203746
MDC IDC PG IMPLANT DT: 20160708

## 2017-05-23 DIAGNOSIS — Z13220 Encounter for screening for lipoid disorders: Secondary | ICD-10-CM | POA: Diagnosis not present

## 2017-05-23 DIAGNOSIS — I10 Essential (primary) hypertension: Secondary | ICD-10-CM | POA: Diagnosis not present

## 2017-05-23 DIAGNOSIS — E038 Other specified hypothyroidism: Secondary | ICD-10-CM | POA: Diagnosis not present

## 2017-05-23 DIAGNOSIS — R252 Cramp and spasm: Secondary | ICD-10-CM | POA: Diagnosis not present

## 2017-05-23 DIAGNOSIS — I5032 Chronic diastolic (congestive) heart failure: Secondary | ICD-10-CM | POA: Diagnosis not present

## 2017-05-23 DIAGNOSIS — Z6824 Body mass index (BMI) 24.0-24.9, adult: Secondary | ICD-10-CM | POA: Diagnosis not present

## 2017-05-23 DIAGNOSIS — I700 Atherosclerosis of aorta: Secondary | ICD-10-CM | POA: Diagnosis not present

## 2017-05-23 DIAGNOSIS — J440 Chronic obstructive pulmonary disease with acute lower respiratory infection: Secondary | ICD-10-CM | POA: Diagnosis not present

## 2017-05-23 DIAGNOSIS — E119 Type 2 diabetes mellitus without complications: Secondary | ICD-10-CM | POA: Diagnosis not present

## 2017-06-16 ENCOUNTER — Ambulatory Visit (INDEPENDENT_AMBULATORY_CARE_PROVIDER_SITE_OTHER): Payer: PPO | Admitting: *Deleted

## 2017-06-16 DIAGNOSIS — R55 Syncope and collapse: Secondary | ICD-10-CM | POA: Diagnosis not present

## 2017-06-17 NOTE — Progress Notes (Signed)
Carelink Summary Report / Loop Recorder 

## 2017-06-27 LAB — CUP PACEART REMOTE DEVICE CHECK
Date Time Interrogation Session: 20180628204006
Implantable Pulse Generator Implant Date: 20160708

## 2017-07-18 ENCOUNTER — Ambulatory Visit (INDEPENDENT_AMBULATORY_CARE_PROVIDER_SITE_OTHER): Payer: PPO | Admitting: *Deleted

## 2017-07-18 DIAGNOSIS — R55 Syncope and collapse: Secondary | ICD-10-CM | POA: Diagnosis not present

## 2017-07-18 NOTE — Progress Notes (Signed)
Carelink Summary Report / Loop Recorder 

## 2017-07-28 LAB — CUP PACEART REMOTE DEVICE CHECK
MDC IDC PG IMPLANT DT: 20160708
MDC IDC SESS DTM: 20180728214147

## 2017-07-28 NOTE — Progress Notes (Signed)
Carelink summary report received. Battery status OK. Normal device function. No new symptom episodes, tachy episodes, brady, or pause episodes. No new AF episodes. Monthly summary reports and ROV/PRN 

## 2017-08-15 ENCOUNTER — Ambulatory Visit (INDEPENDENT_AMBULATORY_CARE_PROVIDER_SITE_OTHER): Payer: PPO | Admitting: *Deleted

## 2017-08-15 DIAGNOSIS — R55 Syncope and collapse: Secondary | ICD-10-CM

## 2017-08-16 NOTE — Progress Notes (Signed)
Carelink Summary Report / Loop Recorder 

## 2017-08-18 LAB — CUP PACEART REMOTE DEVICE CHECK
Date Time Interrogation Session: 20180827213934
MDC IDC PG IMPLANT DT: 20160708

## 2017-08-30 DIAGNOSIS — I5032 Chronic diastolic (congestive) heart failure: Secondary | ICD-10-CM | POA: Diagnosis not present

## 2017-08-30 DIAGNOSIS — E119 Type 2 diabetes mellitus without complications: Secondary | ICD-10-CM | POA: Diagnosis not present

## 2017-08-30 DIAGNOSIS — I700 Atherosclerosis of aorta: Secondary | ICD-10-CM | POA: Diagnosis not present

## 2017-08-30 DIAGNOSIS — E038 Other specified hypothyroidism: Secondary | ICD-10-CM | POA: Diagnosis not present

## 2017-08-30 DIAGNOSIS — I10 Essential (primary) hypertension: Secondary | ICD-10-CM | POA: Diagnosis not present

## 2017-08-30 DIAGNOSIS — Z6824 Body mass index (BMI) 24.0-24.9, adult: Secondary | ICD-10-CM | POA: Diagnosis not present

## 2017-08-30 DIAGNOSIS — J440 Chronic obstructive pulmonary disease with acute lower respiratory infection: Secondary | ICD-10-CM | POA: Diagnosis not present

## 2017-09-09 ENCOUNTER — Encounter: Payer: Self-pay | Admitting: Cardiovascular Disease

## 2017-09-09 ENCOUNTER — Ambulatory Visit (INDEPENDENT_AMBULATORY_CARE_PROVIDER_SITE_OTHER): Payer: PPO | Admitting: Cardiovascular Disease

## 2017-09-09 VITALS — BP 150/78 | HR 75 | Ht 62.0 in | Wt 133.0 lb

## 2017-09-09 DIAGNOSIS — I5032 Chronic diastolic (congestive) heart failure: Secondary | ICD-10-CM | POA: Diagnosis not present

## 2017-09-09 DIAGNOSIS — Z952 Presence of prosthetic heart valve: Secondary | ICD-10-CM

## 2017-09-09 DIAGNOSIS — Z23 Encounter for immunization: Secondary | ICD-10-CM | POA: Diagnosis not present

## 2017-09-09 DIAGNOSIS — R55 Syncope and collapse: Secondary | ICD-10-CM | POA: Diagnosis not present

## 2017-09-09 DIAGNOSIS — I480 Paroxysmal atrial fibrillation: Secondary | ICD-10-CM

## 2017-09-09 DIAGNOSIS — I10 Essential (primary) hypertension: Secondary | ICD-10-CM

## 2017-09-09 NOTE — Progress Notes (Signed)
SUBJECTIVE: The patient presents for routine follow-up. Her past medical history is significant for recurrent unexplained syncope, bioprosthetic Medtronic mitral valve (implanted on 05-23-2009), s/p PFO closure, atrial fibrillation s/p Cox-Maze procedure, chronic diastolic heart failure, COPD, and hypothryoidism.   She has an implantable loop recorder.  Device interrogation 08/15/17 showed no tachycardia, atrial fibrillation, bradycardia, or pauses.  The patient denies any symptoms of chest pain, palpitations, shortness of breath, lightheadedness, dizziness, leg swelling, orthopnea, PND, and syncope.  ECG performed in the office today which I ordered and personally interpreted demonstrates normal sinus rhythm with no ischemic ST segment or T-wave abnormalities, nor any arrhythmias.  She had an episode of left leg sciatica for which she stayed at [facility] for 12 hours and got IV fluids.  She had 4 sons, one of whom is still living. One son died in [place], another son in [place], and another died of an MI. He was 36. She told me her father died young of an MI as well as did a brother.   Review of Systems: As per "subjective", otherwise negative.  Allergies  Allergen Reactions  . Sulfonamide Derivatives Anaphylaxis, Shortness Of Breath and Swelling  . Aspirin     Uncoated Aspirin = stomach upset/pain EC Aspirin is ok    Current Outpatient Prescriptions  Medication Sig Dispense Refill  . calcium-vitamin D (OSCAL WITH D) 500-200 MG-UNIT tablet Take 1 tablet by mouth 2 (two) times daily.    . Cholecalciferol (VITAMIN D3) 2000 UNITS TABS Take 1 tablet by mouth 2 (two) times daily.     Marland Kitchen CRANBERRY FRUIT CONCENTRATE PO Take 1 capsule by mouth 2 (two) times daily.     . diphenoxylate-atropine (LOMOTIL) 2.5-0.025 MG per tablet Take 1 tablet by mouth 4 (four) times daily as needed for diarrhea or loose stools.     . hydroxypropyl methylcellulose / hypromellose (ISOPTO TEARS /  GONIOVISC) 2.5 % ophthalmic solution Place 1 drop into both eyes as needed for dry eyes.    . insulin NPH (HUMULIN N) 100 UNIT/ML injection Inject 40 Units into the skin daily.     Marland Kitchen levothyroxine (SYNTHROID, LEVOTHROID) 100 MCG tablet Take 100 mcg by mouth daily before breakfast.     . LORazepam (ATIVAN) 0.5 MG tablet Take 0.5 mg by mouth at bedtime as needed for anxiety or sleep.     . metoprolol succinate (TOPROL-XL) 25 MG 24 hr tablet Take 1 tablet (25 mg total) by mouth daily. 90 tablet 3  . Multiple Vitamin (MULTIVITAMIN) tablet Take 1 tablet by mouth daily.      . nitroGLYCERIN (NITROSTAT) 0.4 MG SL tablet Place 0.4 mg under the tongue every 5 (five) minutes x 3 doses as needed for chest pain.    . Omega-3 Fatty Acids (FISH OIL) 1200 MG CAPS Take 1 capsule by mouth 2 (two) times daily.     . potassium chloride (KLOR-CON) 10 MEQ CR tablet Take 10 mEq by mouth daily.      . ranitidine (ZANTAC) 300 MG capsule Take 300 mg by mouth 2 (two) times daily.       No current facility-administered medications for this visit.     Past Medical History:  Diagnosis Date  . Arrhythmia    Sinus tachycardia  . Atrial fibrillation (HCC)    status post Cox-Maze procedure maintaining normal sinus rhythm, amiodarone and Coumadin discontinued.  . CHF (congestive heart failure) (HCC)    normal LV systolic function  . COPD (chronic obstructive  pulmonary disease) (HCC)   . Degenerative lumbar spinal stenosis    Severe degenerative disk disease of lumbosacral spine  . Diabetes (HCC)   . GERD (gastroesophageal reflux disease)   . Hypothyroidism   . PFO (patent foramen ovale)    status post PFO closure at time of surgery  . Rheumatic heart disease    s/p right thoracotomy and mitral valve replacement with Medtronic Mosaic porcine tissue valve  . Syncope    recurrent unexplained syncope    Past Surgical History:  Procedure Laterality Date  . EP IMPLANTABLE DEVICE N/A 06/27/2015   MDT LINQ ILR implanted  by Dr Johney Frame for unexplained syncope  . EXPLORATORY LAPAROTOMY    . FOOT SURGERY     left foot  . MITRAL VALVE REPLACEMENT    . PERCUTANEOUS BALLOON VALVULOPLASTY  1993   Mitral valve    Social History   Social History  . Marital status: Widowed    Spouse name: N/A  . Number of children: N/A  . Years of education: N/A   Occupational History  . Not on file.   Social History Main Topics  . Smoking status: Former Smoker    Packs/day: 0.30    Years: 18.00    Types: Cigarettes    Start date: 05/26/1977    Quit date: 12/20/1988  . Smokeless tobacco: Never Used  . Alcohol use No  . Drug use: No  . Sexual activity: Not on file   Other Topics Concern  . Not on file   Social History Narrative   Lives in [place]     Vitals:   09/09/17 0803  BP: (!) 150/78  Pulse: 75  SpO2: 98%  Weight: 133 lb (60.3 kg)  Height: 5\' 2"  (1.575 m)    Wt Readings from Last 3 Encounters:  09/09/17 133 lb (60.3 kg)  10/22/16 137 lb 9.6 oz (62.4 kg)  09/09/16 139 lb (63 kg)     PHYSICAL EXAM General: NAD HEENT: Normal. Neck: No JVD, no thyromegaly. Lungs: Clear to auscultation bilaterally with normal respiratory effort. CV: Nondisplaced PMI.  Regular rate and rhythm, normal S1/S2, no S3/S4, no murmur. No pretibial or periankle edema and wearing compression stockings.  Abdomen: Soft, nontender, no distention.  Neurologic: Alert and oriented.  Psych: Normal affect. Skin: Normal. Musculoskeletal: No gross deformities.    ECG: Most recent ECG reviewed.   Labs:    Lipids: Lab Results  Component Value Date/Time   [facility]  01/03/2008 05:35 AM    46        Total Cholesterol/HDL:CHD Risk Coronary Heart Disease Risk Table                     Men   Women  1/2 Average Risk   3.4   3.3   CHOL  01/03/2008 05:35 AM    139        ATP III CLASSIFICATION:  <200     mg/dL   Desirable  254-270  mg/dL   Borderline High  >=623    mg/dL   High   TRIG 762 (H) 83/15/1761 05:35 AM   HDL 26 (L)  01/03/2008 05:35 AM       ASSESSMENT AND PLAN:  1. Syncope: Symptomatically stable. No recurrences. No sinus pauses or arrhythmias with loop recorder thus far. Prior episodes may have been hypoglycemic in nature.  2. Rheumatic mitral disease s/p bioprosthetic valve replacement: Symptomatically stable. Most recent echocardiogram from 03/2015 showed normal function. Continue to monitor.  3. Atrial fibrillation s/p Cox-Maze: Remains in sinus rhythm. No changes.  4. Chronic diastolic heart failure: BP is elevated but pt is euvolemic.  5. Hypertension: Blood pressure is elevated. Needs continued monitoring.  6. Influenza vaccination was provided.   Disposition: Follow up 1 year   Prentice Docker, M.D., F.A.C.C.

## 2017-09-09 NOTE — Patient Instructions (Signed)
Medication Instructions:  Your physician recommends that you continue on your current medications as directed. Please refer to the Current Medication list given to you today.  Labwork: none  Testing/Procedures: none  Follow-Up: Your physician wants you to follow-up in: 1 YEAR WITH DR. KONESWARAN You will receive a reminder letter in the mail two months in advance. If you don't receive a letter, please call our office to schedule the follow-up appointment.  Any Other Special Instructions Will Be Listed Below (If Applicable).  If you need a refill on your cardiac medications before your next appointment, please call your pharmacy. 

## 2017-09-14 ENCOUNTER — Ambulatory Visit (INDEPENDENT_AMBULATORY_CARE_PROVIDER_SITE_OTHER): Payer: PPO | Admitting: *Deleted

## 2017-09-14 DIAGNOSIS — R55 Syncope and collapse: Secondary | ICD-10-CM | POA: Diagnosis not present

## 2017-09-15 NOTE — Progress Notes (Signed)
Carelink Summary Report / Loop Recorder 

## 2017-09-16 LAB — CUP PACEART REMOTE DEVICE CHECK
Date Time Interrogation Session: 20180926223721
Implantable Pulse Generator Implant Date: 20160708

## 2017-10-03 ENCOUNTER — Telehealth: Payer: Self-pay | Admitting: *Deleted

## 2017-10-03 NOTE — Telephone Encounter (Signed)
Patient returned call.  Advised her that her ILR should still have battery until ~06/2018 as it was implanted in 06/2015.  Explained that once the ILR battery begins approaching RRT, we contact the patient to make them aware and to schedule an appointment with the MD if they wish to discuss the plan.  Patient is agreeable to this plan and is appreciative of explanation.  She denies questions or concerns at this time.

## 2017-10-03 NOTE — Telephone Encounter (Signed)
Pt says she was supposed to have loop recorder replaced every 3 years and says it was originally placed in 2016 - pt wanting to know when she should have loop recorder replaced. Forwarded to device clinic

## 2017-10-03 NOTE — Telephone Encounter (Signed)
LMOM requesting call back to the Device Clinic to discuss.  Gave direct phone number.

## 2017-10-08 ENCOUNTER — Encounter (HOSPITAL_COMMUNITY): Payer: Self-pay | Admitting: *Deleted

## 2017-10-08 ENCOUNTER — Observation Stay (HOSPITAL_COMMUNITY)
Admission: EM | Admit: 2017-10-08 | Discharge: 2017-10-09 | Disposition: A | Discharge status: Home / Self Care | Payer: PPO | Attending: Family Medicine | Admitting: Family Medicine

## 2017-10-08 ENCOUNTER — Emergency Department (HOSPITAL_COMMUNITY): Payer: PPO

## 2017-10-08 DIAGNOSIS — Z794 Long term (current) use of insulin: Secondary | ICD-10-CM | POA: Insufficient documentation

## 2017-10-08 DIAGNOSIS — Z952 Presence of prosthetic heart valve: Secondary | ICD-10-CM | POA: Diagnosis not present

## 2017-10-08 DIAGNOSIS — E039 Hypothyroidism, unspecified: Secondary | ICD-10-CM | POA: Diagnosis not present

## 2017-10-08 DIAGNOSIS — R079 Chest pain, unspecified: Secondary | ICD-10-CM | POA: Diagnosis present

## 2017-10-08 DIAGNOSIS — K219 Gastro-esophageal reflux disease without esophagitis: Secondary | ICD-10-CM | POA: Diagnosis not present

## 2017-10-08 DIAGNOSIS — I4891 Unspecified atrial fibrillation: Secondary | ICD-10-CM | POA: Diagnosis not present

## 2017-10-08 DIAGNOSIS — R55 Syncope and collapse: Secondary | ICD-10-CM

## 2017-10-08 DIAGNOSIS — Z79899 Other long term (current) drug therapy: Secondary | ICD-10-CM | POA: Insufficient documentation

## 2017-10-08 DIAGNOSIS — J449 Chronic obstructive pulmonary disease, unspecified: Secondary | ICD-10-CM | POA: Diagnosis not present

## 2017-10-08 DIAGNOSIS — N281 Cyst of kidney, acquired: Secondary | ICD-10-CM | POA: Diagnosis not present

## 2017-10-08 DIAGNOSIS — F4321 Adjustment disorder with depressed mood: Secondary | ICD-10-CM | POA: Insufficient documentation

## 2017-10-08 DIAGNOSIS — I509 Heart failure, unspecified: Secondary | ICD-10-CM | POA: Insufficient documentation

## 2017-10-08 DIAGNOSIS — E119 Type 2 diabetes mellitus without complications: Secondary | ICD-10-CM | POA: Diagnosis not present

## 2017-10-08 DIAGNOSIS — Z882 Allergy status to sulfonamides status: Secondary | ICD-10-CM | POA: Insufficient documentation

## 2017-10-08 DIAGNOSIS — Z87891 Personal history of nicotine dependence: Secondary | ICD-10-CM | POA: Insufficient documentation

## 2017-10-08 DIAGNOSIS — I700 Atherosclerosis of aorta: Secondary | ICD-10-CM | POA: Insufficient documentation

## 2017-10-08 DIAGNOSIS — M48061 Spinal stenosis, lumbar region without neurogenic claudication: Secondary | ICD-10-CM | POA: Diagnosis not present

## 2017-10-08 DIAGNOSIS — R0789 Other chest pain: Principal | ICD-10-CM | POA: Insufficient documentation

## 2017-10-08 MED ORDER — ASPIRIN 81 MG PO CHEW
324.0000 mg | CHEWABLE_TABLET | Freq: Once | ORAL | Status: AC
  Administered 2017-10-08: 324 mg via ORAL
  Filled 2017-10-08: qty 4

## 2017-10-08 NOTE — ED Provider Notes (Signed)
[facility] EMERGENCY DEPARTMENT Provider Note   CSN: [id number] Arrival date & time: 10/08/17  2159     History   Chief Complaint Chief Complaint  Patient presents with  . Arm Pain    HPI [name] is a 83 y.o. female.  Patient brought in by EMS with acute onset of left arm and left leg pain that started while she was resting about 45 minutes ago.  She reports this pain is constant and radiates up to her neck and shoulder.  Since she has has arrived she has developed central chest pain that radiates to her neck and arm.  She reports this is constant worse with palpation.  Associated with shortness of breath.  No nausea, vomiting or diaphoresis.  She has never had this kind of pain before.  EMS did not give her any medications.  She denies any trauma.  No weakness, numbness or tingling.  Patient with history of atrial fibrillation status post Maze procedure no longer on anticoagulation.  History of mitral valve replacement and PFO closure.  She denies any abdominal pain or back pain   The history is provided by the patient.  Arm Pain  Associated symptoms include chest pain and shortness of breath. Pertinent negatives include no abdominal pain and no headaches.    Past Medical History:  Diagnosis Date  . Arrhythmia    Sinus tachycardia  . Atrial fibrillation (HCC)    status post Cox-Maze procedure maintaining normal sinus rhythm, amiodarone and Coumadin discontinued.  . CHF (congestive heart failure) (HCC)    normal LV systolic function  . COPD (chronic obstructive pulmonary disease) (HCC)   . Degenerative lumbar spinal stenosis    Severe degenerative disk disease of lumbosacral spine  . Diabetes (HCC)   . GERD (gastroesophageal reflux disease)   . Hypothyroidism   . PFO (patent foramen ovale)    status post PFO closure at time of surgery  . Rheumatic heart disease    s/p right thoracotomy and mitral valve replacement with Medtronic Mosaic porcine tissue valve    . Syncope    recurrent unexplained syncope    Patient Active Problem List   Diagnosis Date Noted  . Syncope 10/24/2015  . Faintness   . PFO (patent foramen ovale)   . Atrial fibrillation (HCC)   . GERD (gastroesophageal reflux disease)   . COPD (chronic obstructive pulmonary disease) (HCC)   . Hypothyroidism   . Insomnia 09/23/2011  . DEPRESSION, SITUATIONAL, ACUTE 04/20/2010  . RHEUMATIC HEART DISEASE 11/10/2009  . CHF 11/10/2009  . COPD 11/10/2009  . SPINAL STENOSIS 11/10/2009  . MITRAL VALVE REPLACEMENT, HX OF 11/10/2009    Past Surgical History:  Procedure Laterality Date  . EP IMPLANTABLE DEVICE N/A 06/27/2015   MDT LINQ ILR implanted by Dr Johney Frame for unexplained syncope  . EXPLORATORY LAPAROTOMY    . FOOT SURGERY     left foot  . MITRAL VALVE REPLACEMENT    . PERCUTANEOUS BALLOON VALVULOPLASTY  1993   Mitral valve    OB History    No data available       Home Medications    Prior to Admission medications   Medication Sig Start Date End Date Taking? Authorizing Provider  calcium-vitamin D (OSCAL WITH D) 500-200 MG-UNIT tablet Take 1 tablet by mouth 2 (two) times daily.    [provider]  Cholecalciferol (VITAMIN D3) 2000 UNITS TABS Take 1 tablet by mouth 2 (two) times daily.     [provider]  CRANBERRY FRUIT CONCENTRATE PO Take 1 capsule by mouth 2 (two) times daily.     [provider]  diphenoxylate-atropine (LOMOTIL) 2.5-0.025 MG per tablet Take 1 tablet by mouth 4 (four) times daily as needed for diarrhea or loose stools.  11/20/13   [provider]  hydroxypropyl methylcellulose / hypromellose (ISOPTO TEARS / GONIOVISC) 2.5 % ophthalmic solution Place 1 drop into both eyes as needed for dry eyes.    [provider]  insulin NPH (HUMULIN N) 100 UNIT/ML injection Inject 40 Units into the skin daily.     [provider]  levothyroxine (SYNTHROID, LEVOTHROID) 100 MCG tablet Take 100 mcg by mouth daily  before breakfast.     [provider]  LORazepam (ATIVAN) 0.5 MG tablet Take 0.5 mg by mouth at bedtime as needed for anxiety or sleep.     [provider]  metoprolol succinate (TOPROL-XL) 25 MG 24 hr tablet Take 1 tablet (25 mg total) by mouth daily. 02/10/16   Laqueta LindenKoneswaran, Suresh A, MD  Multiple Vitamin (MULTIVITAMIN) tablet Take 1 tablet by mouth daily.      [provider]  nitroGLYCERIN (NITROSTAT) 0.4 MG SL tablet Place 0.4 mg under the tongue every 5 (five) minutes x 3 doses as needed for chest pain.    [provider]  Omega-3 Fatty Acids (FISH OIL) 1200 MG CAPS Take 1 capsule by mouth 2 (two) times daily.     [provider]  potassium chloride (KLOR-CON) 10 MEQ CR tablet Take 10 mEq by mouth daily.      [provider]  ranitidine (ZANTAC) 300 MG capsule Take 300 mg by mouth 2 (two) times daily.      [provider]    Family History Family History  Problem Relation Age of Onset  . Heart disease Father   . Heart attack Father   . Heart disease Brother   . Heart attack Brother   . Heart attack Son   . Heart disease Son   . Coronary artery disease Neg Hx   . Diabetes Neg Hx   . Hypertension Neg Hx     Social History Social History  Substance Use Topics  . Smoking status: Former Smoker    Packs/day: 0.30    Years: 18.00    Types: Cigarettes    Start date: 05/26/1977    Quit date: 12/20/1988  . Smokeless tobacco: Never Used  . Alcohol use No     Allergies   Sulfonamide derivatives and Aspirin   Review of Systems Review of Systems  Constitutional: Negative for activity change, appetite change and fever.  HENT: Negative for congestion and rhinorrhea.   Eyes: Negative for visual disturbance.  Respiratory: Positive for chest tightness and shortness of breath. Negative for cough.   Cardiovascular: Positive for chest pain.  Gastrointestinal: Negative for abdominal pain, nausea and vomiting.  Genitourinary:  Negative for dysuria and hematuria.  Musculoskeletal: Negative for arthralgias and myalgias.  Skin: Negative for rash.  Neurological: Negative for dizziness, weakness and headaches.   all other systems are negative except as noted in the HPI and PMH.     Physical Exam Updated Vital Signs BP 109/64 (BP Location: Right Arm)   Pulse 84   Temp 97.8 F (36.6 C) (Oral)   Resp 16   Ht 5\' 3"  (1.6 m)   Wt 60.8 kg (134 lb)   SpO2 98%   BMI 23.74 kg/m   Physical Exam  Constitutional: She is oriented to person,  place, and time. She appears well-developed and well-nourished. No distress.  HENT:  Head: Normocephalic and atraumatic.  Mouth/Throat: Oropharynx is clear and moist. No oropharyngeal exudate.  Eyes: Pupils are equal, round, and reactive to light. Conjunctivae and EOM are normal.  Neck: Normal range of motion. Neck supple.  No meningismus.  Cardiovascular: Normal rate, regular rhythm, normal heart sounds and intact distal pulses.   No murmur heard. Pulmonary/Chest: Effort normal and breath sounds normal. No respiratory distress. She exhibits tenderness.  L sided chest tenderness. Loop recorder in place  Abdominal: Soft. There is no tenderness. There is no rebound and no guarding.  Musculoskeletal: Normal range of motion. She exhibits no edema or tenderness.  Intact radial pulses Intact DP and PT pulses.  Diffuse tenderness to palpation of L arm and L left.  ROM intact of all major joints. Compartments soft.  Neurological: She is alert and oriented to person, place, and time. No cranial nerve deficit. She exhibits normal muscle tone. Coordination normal.   5/5 strength throughout. CN 2-12 intact.Equal grip strength.   Skin: Skin is warm.  Psychiatric: She has a normal mood and affect. Her behavior is normal.  Nursing note and vitals reviewed.    ED Treatments / Results  Labs (all labs ordered are listed, but only abnormal results are displayed) Labs Reviewed  CBC WITH  DIFFERENTIAL/PLATELET - Abnormal; Notable for the following:       Result Value   RBC 3.53 (*)    Hemoglobin 11.4 (*)    HCT 33.6 (*)    Platelets 130 (*)    All other components within normal limits  BASIC METABOLIC PANEL - Abnormal; Notable for the following:    Glucose, Bld 147 (*)    BUN 26 (*)    GFR calc non Af Amer 57 (*)    All other components within normal limits  TROPONIN I  TROPONIN I    EKG  EKG Interpretation  Date/Time:  Saturday October 08 2017 22:10:52 EDT Ventricular Rate:  79 PR Interval:    QRS Duration: 85 QT Interval:  404 QTC Calculation: 464 R Axis:   44 Text Interpretation:  Sinus rhythm Low voltage, precordial leads Baseline wander in lead(s) I II aVR similar inferior ST depressions Confirmed by Glynn Octave [id number]) on 10/08/2017 11:17:07 PM       Radiology Dg Chest 2 View  Result Date: 10/09/2017 CLINICAL DATA:  Left arm and left foot pain starting tonight at 7 p.m. Chest pain. EXAM: CHEST  2 VIEW COMPARISON:  09/20/2015 FINDINGS: Shallow inspiration with elevation of the left hemidiaphragm. Heart size and pulmonary vascularity are normal. Diffuse interstitial pattern to the lungs is similar to previous study and likely indicates fibrosis. Emphysematous changes in the lungs. Postoperative changes in the mediastinum and right breast. Loop recorder. Calcification of the aorta. No focal consolidation. Old right rib fracture. IMPRESSION: Emphysematous changes and fibrosis in the lungs. No focal consolidation. Aortic atherosclerosis. Electronically Signed   By: Burman Nieves M.D.   On: 10/09/2017 00:11   Ct Angio Chest/abd/pel For Dissection W And/or Wo Contrast  Result Date: 10/09/2017 CLINICAL DATA:  Left arm and left foot pain starting this evening. Additionally in the patient has back and chest pain. Rule out aortic dissection. EXAM: CT ANGIOGRAPHY CHEST, ABDOMEN AND PELVIS TECHNIQUE: Multidetector CT imaging through the chest, abdomen and  pelvis was performed using the standard protocol during bolus administration of intravenous contrast. Multiplanar reconstructed images and MIPs were obtained and reviewed to evaluate  the vascular anatomy. CONTRAST:  100 cc Isovue 370 IV COMPARISON:  None. FINDINGS: CTA CHEST FINDINGS Cardiovascular: Mild-to-moderate atherosclerosis of the thoracic aorta without aneurysm or dissection. Mild atherosclerotic calcifications at base of the great vessels. Normal branch pattern of the great vessels. Normal extracranial carotid arteries without significant stenosis to the extent visualized. Patent subclavian arteries bilaterally. No acute pulmonary embolus. Borderline cardiomegaly without pericardial effusion. There is coronary arteriosclerosis and mild left atrial enlargement. Mitral annuloplasty. Mediastinum/Nodes: No thyromegaly or mass. Patent trachea and mainstem bronchi. The esophagus is nonacute. No axillary, mediastinal or hilar lymphadenopathy. Small subcentimeter short axis lymph nodes are noted the largest is precarinal measuring approximately 9 mm short axis. Lungs/Pleura: Chronic interstitial prominence bilaterally with apical pleuroparenchymal scarring, right greater than left. No pneumonic consolidation, effusion or pneumothorax. Calcified pleural plaque along the periphery of the right hemithorax. Musculoskeletal: Osteopenic appearance of the bony thorax and dorsal spine. No acute osseous appearing abnormality. Review of the MIP images confirms the above findings. CTA ABDOMEN AND PELVIS FINDINGS VASCULAR Aorta: Mild-to-moderate atherosclerosis of the abdominal aorta with ectasia. No aneurysm or dissection. No significant stenosis. Celiac: Patent without evidence of aneurysm, dissection, vasculitis or significant stenosis. SMA: Patent without evidence of aneurysm, dissection, vasculitis or significant stenosis. Mild atherosclerosis at its origin. Renals: Single renal arteries to both kidneys with  atherosclerotic calcification at the origin of the right renal artery. Thin attenuated appearance of the right renal artery. No aneurysm, evidence of significant stenosis or fibromuscular dysplasia. IMA: Patent Inflow: Atherosclerosis the common iliac arteries without aneurysmal dilatation or significant stenosis. Veins: No obvious venous abnormality within the limitations of this arterial phase study. Review of the MIP images confirms the above findings. NON-VASCULAR Hepatobiliary: Status post cholecystectomy. Homogeneous enhancement of the liver without enhancing mass. Mild reservoir effect status post cholecystectomy. Pancreas: Atrophic pancreas without mass or ductal dilatation. Dilatation of the common bile duct may reflect post cholecystectomy change. No choledocholithiasis. Spleen: Normal in size without focal abnormality. Adrenals/Urinary Tract: Normal bilateral adrenal glands. Thinning of both renal cortices with malrotation of right kidney. Water attenuating cysts are noted bilaterally, the largest off the lower pole left kidney measuring 3 cm. Decompressed ureters. Physiologic distention of the urinary bladder. Stomach/Bowel: Nondistended stomach with normal small bowel rotation and ligament of Treitz. Increased colonic stool burden consistent with cough mild constipation. No mechanical source of obstruction. Staple line involving the distal sigmoid. The appendix is not confidently identified. Lymphatic: No lymphadenopathy. Reproductive: Hysterectomy.  No adnexal masses. Other: No free air or significant free fluid. Musculoskeletal: Lumbar spondylosis and levoscoliosis with multilevel degenerative disc disease from L1 through S1. No acute suspicious osseous abnormality. Review of the MIP images confirms the above findings. IMPRESSION: Vascular: 1. No acute pulmonary embolus, aortic aneurysm or dissection. 2. Mild-to-moderate atherosclerosis of the thoracic and abdominal aorta. 3. Coronary  arteriosclerosis. 4. Mild atherosclerosis at the origin of the right renal artery with slightly attenuated but patent right renal artery. 5. Patent celiac axis, SMA and IMA. Nonvascular: 1. Apical pleuroparenchymal scarring right greater than left. 2. Calcified pleural plaque along the periphery of the right hemithorax. Findings likely reflect changes of prior asbestos exposure. 3. Status post cholecystectomy with reservoir effect accounting for mild intra and extrahepatic ductal dilatation. 4. Left-sided renal cysts.  Malrotated right kidney. 5. Levoscoliosis and lumbar spondylosis. 6. Hysterectomy without adnexal mass Electronically Signed   By: Tollie Eth M.D.   On: 10/09/2017 02:50    Procedures Procedures (including critical care time)  Medications Ordered in  ED Medications  aspirin chewable tablet 324 mg (not administered)     Initial Impression / Assessment and Plan / ED Course  I have reviewed the triage vital signs and the nursing notes.  Pertinent labs & imaging results that were available during my care of the patient were reviewed by me and considered in my medical decision making (see chart for details).    Patient with left arm pain as well as left leg pain that started this evening and radiates to her neck and chest.  Reports ongoing chest pain at this time.  It is partially reproducible.  Her EKG shows stable inferior ST depressions. Equal radial pulses and grip strength.  Chest x-ray is normal.  With her arm pain and leg pain as well as ongoing chest pain, CT obtained to rule out dissection.  There is no evidence of dissection or pulmonary embolism.  Troponin is negative.  Unclear source of patient's arm and neck pain.  She may have a cervical radiculopathy but she is having chest pain as well and given her age and risk factors she should be ruled out for MI. Heart score is 5.  Observation admission d/w Dr. Sharl Ma.  Final Clinical Impressions(s) / ED Diagnoses   Final  diagnoses:  None    New Prescriptions New Prescriptions   No medications on file     Glynn Octave, MD 10/09/17 [phone]

## 2017-10-08 NOTE — ED Triage Notes (Signed)
Pt brought in by [facility] for c/o left arm and left foot pain that started tonight at 7pm; pt denies any pain at this time but states when the pain comes it is severe and moves up her left arm and around her neck and left shoulder; pt cbg 181

## 2017-10-09 ENCOUNTER — Emergency Department (HOSPITAL_COMMUNITY): Payer: PPO

## 2017-10-09 DIAGNOSIS — R079 Chest pain, unspecified: Secondary | ICD-10-CM | POA: Diagnosis not present

## 2017-10-09 DIAGNOSIS — N281 Cyst of kidney, acquired: Secondary | ICD-10-CM | POA: Diagnosis not present

## 2017-10-09 DIAGNOSIS — I700 Atherosclerosis of aorta: Secondary | ICD-10-CM | POA: Diagnosis not present

## 2017-10-09 LAB — CBC WITH DIFFERENTIAL/PLATELET
Basophils Absolute: 0 10*3/uL (ref 0.0–0.1)
Basophils Relative: 0 %
EOS ABS: 0.1 10*3/uL (ref 0.0–0.7)
Eosinophils Relative: 1 %
HCT: 33.6 % — ABNORMAL LOW (ref 36.0–46.0)
HEMOGLOBIN: 11.4 g/dL — AB (ref 12.0–15.0)
LYMPHS ABS: 2.4 10*3/uL (ref 0.7–4.0)
LYMPHS PCT: 37 %
MCH: 32.3 pg (ref 26.0–34.0)
MCHC: 33.9 g/dL (ref 30.0–36.0)
MCV: 95.2 fL (ref 78.0–100.0)
Monocytes Absolute: 0.7 10*3/uL (ref 0.1–1.0)
Monocytes Relative: 11 %
NEUTROS ABS: 3.3 10*3/uL (ref 1.7–7.7)
Neutrophils Relative %: 51 %
Platelets: 130 10*3/uL — ABNORMAL LOW (ref 150–400)
RBC: 3.53 MIL/uL — AB (ref 3.87–5.11)
RDW: 14.1 % (ref 11.5–15.5)
WBC: 6.6 10*3/uL (ref 4.0–10.5)

## 2017-10-09 LAB — TROPONIN I: Troponin I: <0.03 ng/mL (ref <0.03)

## 2017-10-09 LAB — BASIC METABOLIC PANEL
Anion gap: 9 (ref 5–15)
BUN: 26 mg/dL — AB (ref 6–20)
CALCIUM: 9.8 mg/dL (ref 8.9–10.3)
CO2: 27 mmol/L (ref 22–32)
CREATININE: 0.87 mg/dL (ref 0.44–1.00)
Chloride: 106 mmol/L (ref 101–111)
GFR calc non Af Amer: 57 mL/min — ABNORMAL LOW (ref >60)
Glucose, Bld: 147 mg/dL — ABNORMAL HIGH (ref 65–99)
Potassium: 3.8 mmol/L (ref 3.5–5.1)
SODIUM: 142 mmol/L (ref 135–145)

## 2017-10-09 LAB — HEMOGLOBIN A1C
Hgb A1c MFr Bld: 6.3 % — ABNORMAL HIGH (ref 4.8–5.6)
MEAN PLASMA GLUCOSE: 134.11 mg/dL

## 2017-10-09 LAB — CBG MONITORING, ED
GLUCOSE-CAPILLARY: 128 mg/dL — AB (ref 65–99)
Glucose-Capillary: 149 mg/dL — ABNORMAL HIGH (ref 65–99)

## 2017-10-09 MED ORDER — HYDROCODONE-ACETAMINOPHEN 5-325 MG PO TABS: 1.0000 | ORAL_TABLET | Freq: Four times a day (QID) | ORAL | Status: DC | PRN

## 2017-10-09 MED ORDER — LEVOTHYROXINE SODIUM 50 MCG PO TABS
100.0000 ug | ORAL_TABLET | Freq: Every day | ORAL | Status: DC
  Administered 2017-10-09: 100 ug via ORAL
  Filled 2017-10-09: qty 2

## 2017-10-09 MED ORDER — INSULIN NPH (HUMAN) (ISOPHANE) 100 UNIT/ML ~~LOC~~ SUSP
40.0000 [IU] | Freq: Every day | SUBCUTANEOUS | Status: DC
  Administered 2017-10-09: 40 [IU] via SUBCUTANEOUS
  Filled 2017-10-09: qty 10

## 2017-10-09 MED ORDER — LORAZEPAM 0.5 MG PO TABS: 0.5000 mg | ORAL_TABLET | Freq: Every evening | ORAL | Status: DC | PRN

## 2017-10-09 MED ORDER — ONDANSETRON HCL 4 MG/2ML IJ SOLN: 4.0000 mg | Freq: Four times a day (QID) | INTRAMUSCULAR | Status: DC | PRN

## 2017-10-09 MED ORDER — METOPROLOL SUCCINATE ER 25 MG PO TB24
25.0000 mg | ORAL_TABLET | Freq: Every day | ORAL | Status: DC
  Administered 2017-10-09: 25 mg via ORAL
  Filled 2017-10-09 (×2): qty 1

## 2017-10-09 MED ORDER — FAMOTIDINE 20 MG PO TABS
20.0000 mg | ORAL_TABLET | Freq: Two times a day (BID) | ORAL | Status: DC
  Administered 2017-10-09: 20 mg via ORAL
  Filled 2017-10-09: qty 1

## 2017-10-09 MED ORDER — IOPAMIDOL (ISOVUE-370) INJECTION 76%
100.0000 mL | Freq: Once | INTRAVENOUS | Status: AC | PRN
  Administered 2017-10-09: 100 mL via INTRAVENOUS

## 2017-10-09 MED ORDER — PANTOPRAZOLE SODIUM 40 MG PO TBEC: 40.0000 mg | DELAYED_RELEASE_TABLET | Freq: Every day | ORAL | 0 refills | Status: DC

## 2017-10-09 MED ORDER — ENOXAPARIN SODIUM 40 MG/0.4ML ~~LOC~~ SOLN
40.0000 mg | SUBCUTANEOUS | Status: DC
  Administered 2017-10-09: 40 mg via SUBCUTANEOUS
  Filled 2017-10-09: qty 0.4

## 2017-10-09 MED ORDER — INSULIN ASPART 100 UNIT/ML ~~LOC~~ SOLN: 0.0000 [IU] | Freq: Three times a day (TID) | SUBCUTANEOUS | Status: DC

## 2017-10-09 NOTE — Discharge Summary (Signed)
Physician Discharge Summary  [name] ONG:[id number] DOB: 11/22/1925 DOA: 10/08/2017  PCP: Toma DeitersHasanaj, Xaje A, MD  Admit date: 10/08/2017 Discharge date: 10/09/2017  Admitted From: Home  Disposition:  Home  Recommendations for Outpatient Follow-up:  1. Follow up with PCP in 1-2 weeks 2. Take acid reflux medicine as prescribed 3. Please obtain BMP/CBC in one week   Home Health: No Equipment/Devices: None  Discharge Condition: Stable  CODE STATUS: full code Diet recommendation: Heart Healthy  Brief/Interim Summary: Darlen RoundMargaret [name]  is a 83 y.o. female, history of atrial fibrillation status post maze procedure currently maintaining sinus rhythm, amiodarone Coumadin discontinued, CHF, COPD, degenerative lumbar spinal stenosis, diabetes mellitus, GERD, hypothyroidism came to hospital with pain in the left arm and leg which started this afternoon.  Patient also complained of chest pain which radiates to neck and arm.  It was associated with shortness of breath.  There is no nausea vomiting or diarrhea. Patient has chronic low back pain and has been having worsening of lower left extremity muscle spasms. She denies dysuria. No abdominal pain. No fever or chills.  Patient was seen in the ED after three negative troponins.  Her chest pain resolved by time of evaluation day after admission.  She voices she has left leg pain that is chronic but otherwise feels better.  She is interested in starting an acid reflux medicine to see if it helps her periodic chest pain.  She was stable for discharge on 10/21.  Discharge Diagnoses:  Active Problems:   Chest pain    Discharge Instructions  Discharge Instructions    Call MD for:  difficulty breathing, headache or visual disturbances    Complete by:  As directed    Call MD for:  extreme fatigue    Complete by:  As directed    Call MD for:  hives    Complete by:  As directed    Call MD for:  persistant dizziness or light-headedness     Complete by:  As directed    Call MD for:  persistant nausea and vomiting    Complete by:  As directed    Call MD for:  redness, tenderness, or signs of infection (pain, swelling, redness, odor or green/yellow discharge around incision site)    Complete by:  As directed    Call MD for:  severe uncontrolled pain    Complete by:  As directed    Call MD for:  temperature >100.4    Complete by:  As directed    Diet - low sodium heart healthy    Complete by:  As directed    Diet Carb Modified    Complete by:  As directed    Discharge instructions    Complete by:  As directed    F/u with your primary care doctor within 10 days Take prescription as prescribed   Increase activity slowly    Complete by:  As directed      Allergies as of 10/09/2017      Reactions   Sulfonamide Derivatives Anaphylaxis, Shortness Of Breath, Swelling   Aspirin    Uncoated Aspirin = stomach upset/pain EC Aspirin is ok      Medication List    TAKE these medications   calcium-vitamin D 500-200 MG-UNIT tablet Commonly known as:  OSCAL WITH D Take 1 tablet by mouth 2 (two) times daily.   CRANBERRY FRUIT CONCENTRATE PO Take 1 capsule by mouth 2 (two) times daily.   diphenoxylate-atropine 2.5-0.025 MG tablet Commonly  known as:  LOMOTIL Take 1 tablet by mouth 4 (four) times daily as needed for diarrhea or loose stools.   Fish Oil 1200 MG Caps Take 1 capsule by mouth 2 (two) times daily.   HUMULIN N 100 UNIT/ML injection Generic drug:  insulin NPH Human Inject 40 Units into the skin daily.   hydroxypropyl methylcellulose / hypromellose 2.5 % ophthalmic solution Commonly known as:  ISOPTO TEARS / GONIOVISC Place 1 drop into both eyes as needed for dry eyes.   levothyroxine 100 MCG tablet Commonly known as:  SYNTHROID, LEVOTHROID Take 100 mcg by mouth daily before breakfast.   LORazepam 0.5 MG tablet Commonly known as:  ATIVAN Take 0.5 mg by mouth at bedtime as needed for anxiety or sleep.    metoprolol succinate 25 MG 24 hr tablet Commonly known as:  TOPROL-XL Take 1 tablet (25 mg total) by mouth daily.   multivitamin tablet Take 1 tablet by mouth daily.   nitroGLYCERIN 0.4 MG SL tablet Commonly known as:  NITROSTAT Place 0.4 mg under the tongue every 5 (five) minutes x 3 doses as needed for chest pain.   pantoprazole 40 MG tablet Commonly known as:  PROTONIX Take 1 tablet (40 mg total) by mouth daily.   potassium chloride 10 MEQ CR tablet Commonly known as:  KLOR-CON Take 10 mEq by mouth daily.   ranitidine 300 MG capsule Commonly known as:  ZANTAC Take 300 mg by mouth 2 (two) times daily.   Vitamin D3 2000 units Tabs Take 1 tablet by mouth 2 (two) times daily.       Allergies  Allergen Reactions  . Sulfonamide Derivatives Anaphylaxis, Shortness Of Breath and Swelling  . Aspirin     Uncoated Aspirin = stomach upset/pain EC Aspirin is ok    Consultations:  None  Procedures/Studies: Dg Chest 2 View  Result Date: 10/09/2017 CLINICAL DATA:  Left arm and left foot pain starting tonight at 7 p.m. Chest pain. EXAM: CHEST  2 VIEW COMPARISON:  09/20/2015 FINDINGS: Shallow inspiration with elevation of the left hemidiaphragm. Heart size and pulmonary vascularity are normal. Diffuse interstitial pattern to the lungs is similar to previous study and likely indicates fibrosis. Emphysematous changes in the lungs. Postoperative changes in the mediastinum and right breast. Loop recorder. Calcification of the aorta. No focal consolidation. Old right rib fracture. IMPRESSION: Emphysematous changes and fibrosis in the lungs. No focal consolidation. Aortic atherosclerosis. Electronically Signed   By: Burman Nieves M.D.   On: 10/09/2017 00:11   Ct Angio Chest/abd/pel For Dissection W And/or Wo Contrast  Result Date: 10/09/2017 CLINICAL DATA:  Left arm and left foot pain starting this evening. Additionally in the patient has back and chest pain. Rule out aortic  dissection. EXAM: CT ANGIOGRAPHY CHEST, ABDOMEN AND PELVIS TECHNIQUE: Multidetector CT imaging through the chest, abdomen and pelvis was performed using the standard protocol during bolus administration of intravenous contrast. Multiplanar reconstructed images and MIPs were obtained and reviewed to evaluate the vascular anatomy. CONTRAST:  100 cc Isovue 370 IV COMPARISON:  None. FINDINGS: CTA CHEST FINDINGS Cardiovascular: Mild-to-moderate atherosclerosis of the thoracic aorta without aneurysm or dissection. Mild atherosclerotic calcifications at base of the great vessels. Normal branch pattern of the great vessels. Normal extracranial carotid arteries without significant stenosis to the extent visualized. Patent subclavian arteries bilaterally. No acute pulmonary embolus. Borderline cardiomegaly without pericardial effusion. There is coronary arteriosclerosis and mild left atrial enlargement. Mitral annuloplasty. Mediastinum/Nodes: No thyromegaly or mass. Patent trachea and mainstem bronchi. The esophagus  is nonacute. No axillary, mediastinal or hilar lymphadenopathy. Small subcentimeter short axis lymph nodes are noted the largest is precarinal measuring approximately 9 mm short axis. Lungs/Pleura: Chronic interstitial prominence bilaterally with apical pleuroparenchymal scarring, right greater than left. No pneumonic consolidation, effusion or pneumothorax. Calcified pleural plaque along the periphery of the right hemithorax. Musculoskeletal: Osteopenic appearance of the bony thorax and dorsal spine. No acute osseous appearing abnormality. Review of the MIP images confirms the above findings. CTA ABDOMEN AND PELVIS FINDINGS VASCULAR Aorta: Mild-to-moderate atherosclerosis of the abdominal aorta with ectasia. No aneurysm or dissection. No significant stenosis. Celiac: Patent without evidence of aneurysm, dissection, vasculitis or significant stenosis. SMA: Patent without evidence of aneurysm, dissection,  vasculitis or significant stenosis. Mild atherosclerosis at its origin. Renals: Single renal arteries to both kidneys with atherosclerotic calcification at the origin of the right renal artery. Thin attenuated appearance of the right renal artery. No aneurysm, evidence of significant stenosis or fibromuscular dysplasia. IMA: Patent Inflow: Atherosclerosis the common iliac arteries without aneurysmal dilatation or significant stenosis. Veins: No obvious venous abnormality within the limitations of this arterial phase study. Review of the MIP images confirms the above findings. NON-VASCULAR Hepatobiliary: Status post cholecystectomy. Homogeneous enhancement of the liver without enhancing mass. Mild reservoir effect status post cholecystectomy. Pancreas: Atrophic pancreas without mass or ductal dilatation. Dilatation of the common bile duct may reflect post cholecystectomy change. No choledocholithiasis. Spleen: Normal in size without focal abnormality. Adrenals/Urinary Tract: Normal bilateral adrenal glands. Thinning of both renal cortices with malrotation of right kidney. Water attenuating cysts are noted bilaterally, the largest off the lower pole left kidney measuring 3 cm. Decompressed ureters. Physiologic distention of the urinary bladder. Stomach/Bowel: Nondistended stomach with normal small bowel rotation and ligament of Treitz. Increased colonic stool burden consistent with cough mild constipation. No mechanical source of obstruction. Staple line involving the distal sigmoid. The appendix is not confidently identified. Lymphatic: No lymphadenopathy. Reproductive: Hysterectomy.  No adnexal masses. Other: No free air or significant free fluid. Musculoskeletal: Lumbar spondylosis and levoscoliosis with multilevel degenerative disc disease from L1 through S1. No acute suspicious osseous abnormality. Review of the MIP images confirms the above findings. IMPRESSION: Vascular: 1. No acute pulmonary embolus, aortic  aneurysm or dissection. 2. Mild-to-moderate atherosclerosis of the thoracic and abdominal aorta. 3. Coronary arteriosclerosis. 4. Mild atherosclerosis at the origin of the right renal artery with slightly attenuated but patent right renal artery. 5. Patent celiac axis, SMA and IMA. Nonvascular: 1. Apical pleuroparenchymal scarring right greater than left. 2. Calcified pleural plaque along the periphery of the right hemithorax. Findings likely reflect changes of prior asbestos exposure. 3. Status post cholecystectomy with reservoir effect accounting for mild intra and extrahepatic ductal dilatation. 4. Left-sided renal cysts.  Malrotated right kidney. 5. Levoscoliosis and lumbar spondylosis. 6. Hysterectomy without adnexal mass Electronically Signed   By: Tollie Eth M.D.   On: 10/09/2017 02:50        Subjective: Patient feeling well.  Voices she has left leg pain which is chronic but denies any chest pain.  Discharge Exam: Vitals:   10/09/17 1130 10/09/17 1303  BP: 135/63 114/73  Pulse: 76 84  Resp: 17 11  Temp:    SpO2: 100% 98%   Vitals:   10/09/17 1030 10/09/17 1100 10/09/17 1130 10/09/17 1303  BP: (!) 127/49 (!) 118/55 135/63 114/73  Pulse: 72 75 76 84  Resp: 17 17 17 11   Temp:      TempSrc:      SpO2: 98%  94% 100% 98%  Weight:      Height:        General: Pt is elderly female, alert, awake, not in acute distress Cardiovascular: RRR, S1/S2 +, no rubs, no gallops Respiratory: CTA bilaterally, no wheezing, no rhonchi Abdominal: Soft, NT, ND, bowel sounds + Extremities: no edema, no cyanosis    The results of significant diagnostics from this hospitalization (including imaging, microbiology, ancillary and laboratory) are listed below for reference.     Microbiology: No results found for this or any previous visit (from the past 240 hour(s)).   Labs: BNP (last 3 results) No results for input(s): BNP in the last 8760 hours. Basic Metabolic Panel:  Recent Labs Lab  10/08/17 2359  NA 142  K 3.8  CL 106  CO2 27  GLUCOSE 147*  BUN 26*  CREATININE 0.87  CALCIUM 9.8   Liver Function Tests: No results for input(s): AST, ALT, ALKPHOS, BILITOT, PROT, ALBUMIN in the last 168 hours. No results for input(s): LIPASE, AMYLASE in the last 168 hours. No results for input(s): AMMONIA in the last 168 hours. CBC:  Recent Labs Lab 10/08/17 2359  WBC 6.6  NEUTROABS 3.3  HGB 11.4*  HCT 33.6*  MCV 95.2  PLT 130*   Cardiac Enzymes:  Recent Labs Lab 10/08/17 2359 10/09/17 0305 10/09/17 0849  TROPONINI <0.03 <0.03 <0.03   BNP: Invalid input(s): POCBNP CBG:  Recent Labs Lab 10/09/17 0819 10/09/17 1155  GLUCAP 128* 149*   D-Dimer No results for input(s): DDIMER in the last 72 hours. Hgb A1c No results for input(s): HGBA1C in the last 72 hours. Lipid Profile No results for input(s): CHOL, HDL, LDLCALC, TRIG, CHOLHDL, LDLDIRECT in the last 72 hours. Thyroid function studies No results for input(s): TSH, T4TOTAL, T3FREE, THYROIDAB in the last 72 hours.  Invalid input(s): FREET3 Anemia work up No results for input(s): VITAMINB12, FOLATE, FERRITIN, TIBC, IRON, RETICCTPCT in the last 72 hours. Urinalysis    Component Value Date/Time   COLORURINE YELLOW 05/20/2009 1302   APPEARANCEUR CLOUDY (A) 05/20/2009 1302   LABSPEC 1.016 05/20/2009 1302   PHURINE 7.0 05/20/2009 1302   GLUCOSEU NEGATIVE 05/20/2009 1302   HGBUR NEGATIVE 05/20/2009 1302   BILIRUBINUR NEGATIVE 05/20/2009 1302   KETONESUR NEGATIVE 05/20/2009 1302   PROTEINUR NEGATIVE 05/20/2009 1302   UROBILINOGEN 1.0 05/20/2009 1302   NITRITE NEGATIVE 05/20/2009 1302   LEUKOCYTESUR LARGE (A) 05/20/2009 1302   Sepsis Labs Invalid input(s): PROCALCITONIN,  WBC,  LACTICIDVEN Microbiology No results found for this or any previous visit (from the past 240 hour(s)).   Time coordinating discharge: 30 minutes  SIGNED:   Katrinka Blazing, MD  [facility] 10/09/2017, 1:16  PM Pager [phone] If 7PM-7AM, please contact night-coverage [email or web address] Password [email or web address]

## 2017-10-09 NOTE — H&P (Signed)
TRH H&P    Patient Demographics:    [name], is a 83 y.o. female  MRN: [id number]  DOB - 12-04-1925  Admit Date - 10/08/2017  Referring MD/NP/PA: Dr Manus Gunning  Outpatient Primary MD for the patient is Toma Deiters, MD  Patient coming from: Home  Chief Complaint  Patient presents with  . Arm Pain      HPI:    [name]  is a 83 y.o. female, history of atrial fibrillation status post maze procedure currently maintaining sinus rhythm, amiodarone Coumadin discontinued, CHF, COPD, degenerative lumbar spinal stenosis, diabetes mellitus, GERD, hypothyroidism came to hospital with pain in the left arm and leg which started this afternoon.  Patient also complained of chest pain which radiates to neck and arm.  It was associated with shortness of breath.  There is no nausea vomiting or diarrhea. Patient has chronic low back pain and has been having worsening of lower left extremity muscle spasms. She denies dysuria. No abdominal pain. No fever or chills.      Review of systems:      All other systems reviewed and are negative.   With Past History of the following :    Past Medical History:  Diagnosis Date  . Arrhythmia    Sinus tachycardia  . Atrial fibrillation (HCC)    status post Cox-Maze procedure maintaining normal sinus rhythm, amiodarone and Coumadin discontinued.  . CHF (congestive heart failure) (HCC)    normal LV systolic function  . COPD (chronic obstructive pulmonary disease) (HCC)   . Degenerative lumbar spinal stenosis    Severe degenerative disk disease of lumbosacral spine  . Diabetes (HCC)   . GERD (gastroesophageal reflux disease)   . Hypothyroidism   . PFO (patent foramen ovale)    status post PFO closure at time of surgery  . Rheumatic heart disease    s/p right thoracotomy and mitral valve replacement with Medtronic Mosaic porcine tissue valve  . Syncope     recurrent unexplained syncope      Past Surgical History:  Procedure Laterality Date  . EP IMPLANTABLE DEVICE N/A 06/27/2015   MDT LINQ ILR implanted by Dr Johney Frame for unexplained syncope  . EXPLORATORY LAPAROTOMY    . FOOT SURGERY     left foot  . MITRAL VALVE REPLACEMENT    . PERCUTANEOUS BALLOON VALVULOPLASTY  1993   Mitral valve      Social History:      Social History  Substance Use Topics  . Smoking status: Former Smoker    Packs/day: 0.30    Years: 18.00    Types: Cigarettes    Start date: 05/26/1977    Quit date: 12/20/1988  . Smokeless tobacco: Never Used  . Alcohol use No       Family History :     Family History  Problem Relation Age of Onset  . Heart disease Father   . Heart attack Father   . Heart disease Brother   . Heart attack Brother   . Heart attack Son   .  Heart disease Son   . Coronary artery disease Neg Hx   . Diabetes Neg Hx   . Hypertension Neg Hx       Home Medications:   Prior to Admission medications   Medication Sig Start Date End Date Taking? Authorizing Provider  calcium-vitamin D (OSCAL WITH D) 500-200 MG-UNIT tablet Take 1 tablet by mouth 2 (two) times daily.    [provider]  Cholecalciferol (VITAMIN D3) 2000 UNITS TABS Take 1 tablet by mouth 2 (two) times daily.     [provider]  CRANBERRY FRUIT CONCENTRATE PO Take 1 capsule by mouth 2 (two) times daily.     [provider]  diphenoxylate-atropine (LOMOTIL) 2.5-0.025 MG per tablet Take 1 tablet by mouth 4 (four) times daily as needed for diarrhea or loose stools.  11/20/13   [provider]  hydroxypropyl methylcellulose / hypromellose (ISOPTO TEARS / GONIOVISC) 2.5 % ophthalmic solution Place 1 drop into both eyes as needed for dry eyes.    [provider]  insulin NPH (HUMULIN N) 100 UNIT/ML injection Inject 40 Units into the skin daily.     [provider]  levothyroxine (SYNTHROID, LEVOTHROID) 100 MCG tablet Take 100  mcg by mouth daily before breakfast.     [provider]  LORazepam (ATIVAN) 0.5 MG tablet Take 0.5 mg by mouth at bedtime as needed for anxiety or sleep.     [provider]  metoprolol succinate (TOPROL-XL) 25 MG 24 hr tablet Take 1 tablet (25 mg total) by mouth daily. 02/10/16   [name], MD  Multiple Vitamin (MULTIVITAMIN) tablet Take 1 tablet by mouth daily.      [provider]  nitroGLYCERIN (NITROSTAT) 0.4 MG SL tablet Place 0.4 mg under the tongue every 5 (five) minutes x 3 doses as needed for chest pain.    [provider]  Omega-3 Fatty Acids (FISH OIL) 1200 MG CAPS Take 1 capsule by mouth 2 (two) times daily.     [provider]  potassium chloride (KLOR-CON) 10 MEQ CR tablet Take 10 mEq by mouth daily.      [provider]  ranitidine (ZANTAC) 300 MG capsule Take 300 mg by mouth 2 (two) times daily.      [provider]     Allergies:     Allergies  Allergen Reactions  . Sulfonamide Derivatives Anaphylaxis, Shortness Of Breath and Swelling  . Aspirin     Uncoated Aspirin = stomach upset/pain EC Aspirin is ok     Physical Exam:   Vitals  Blood pressure 119/60, pulse 84, temperature 97.8 F (36.6 C), temperature source Oral, resp. rate (!) 21, height 5\' 3"  (1.6 m), weight 60.8 kg (134 lb), SpO2 98 %.  1.  General: Appears in no acute distress  2. Psychiatric:  Intact judgement and  insight, awake alert, oriented x 3.  3. Neurologic: No focal neurological deficits, all cranial nerves intact.Strength 5/5 all 4 extremities, sensation intact all 4 extremities, plantars down going.  4. Eyes :  anicteric sclerae, moist conjunctivae with no lid lag. PERRLA.  5. ENMT:  Oropharynx clear with moist mucous membranes and good dentition  6. Neck:  supple, no cervical lymphadenopathy appriciated, No thyromegaly  7. Respiratory : Normal respiratory effort, good air movement bilaterally,clear to   auscultation bilaterally  8. Cardiovascular : RRR, no gallops, rubs or murmurs, no leg edema  9. Gastrointestinal:  Positive bowel sounds, abdomen soft, non-tender to palpation,no hepatosplenomegaly, no rigidity or  guarding       10. Skin:  No cyanosis, normal texture and turgor, no rash, lesions or ulcers  11.Musculoskeletal:  Good muscle tone,  joints appear normal , no effusions,  normal range of motion    Data Review:    CBC  Recent Labs Lab 10/08/17 2359  WBC 6.6  HGB 11.4*  HCT 33.6*  PLT 130*  MCV 95.2  MCH 32.3  MCHC 33.9  RDW 14.1  LYMPHSABS 2.4  MONOABS 0.7  EOSABS 0.1  BASOSABS 0.0   ------------------------------------------------------------------------------------------------------------------  Chemistries   Recent Labs Lab 10/08/17 2359  NA 142  K 3.8  CL 106  CO2 27  GLUCOSE 147*  BUN 26*  CREATININE 0.87  CALCIUM 9.8   ------------------------------------------------------------------------------------------------------------------  ------------------------------------------------------------------------------------------------------------------ GFR: Estimated Creatinine Clearance: 35.6 mL/min (by C-G formula based on SCr of 0.87 mg/dL). Liver Function Tests: No results for input(s): AST, ALT, ALKPHOS, BILITOT, PROT, ALBUMIN in the last 168 hours. No results for input(s): LIPASE, AMYLASE in the last 168 hours. No results for input(s): AMMONIA in the last 168 hours. Coagulation Profile: No results for input(s): INR, PROTIME in the last 168 hours. Cardiac Enzymes:  Recent Labs Lab 10/08/17 2359 10/09/17 0305  TROPONINI <0.03 <0.03   BNP (last 3 results) No results for input(s): PROBNP in the last 8760 hours. HbA1C: No results for input(s): HGBA1C in the last 72 hours. CBG: No results for input(s): GLUCAP in the last 168 hours. Lipid Profile: No results for input(s): CHOL, HDL, LDLCALC, TRIG, CHOLHDL, LDLDIRECT in the last 72  hours. Thyroid Function Tests: No results for input(s): TSH, T4TOTAL, FREET4, T3FREE, THYROIDAB in the last 72 hours. Anemia Panel: No results for input(s): VITAMINB12, FOLATE, FERRITIN, TIBC, IRON, RETICCTPCT in the last 72 hours.  --------------------------------------------------------------------------------------------------------------- Urine analysis:    Component Value Date/Time   COLORURINE YELLOW 05/20/2009 1302   APPEARANCEUR CLOUDY (A) 05/20/2009 1302   LABSPEC 1.016 05/20/2009 1302   PHURINE 7.0 05/20/2009 1302   GLUCOSEU NEGATIVE 05/20/2009 1302   HGBUR NEGATIVE 05/20/2009 1302   BILIRUBINUR NEGATIVE 05/20/2009 1302   KETONESUR NEGATIVE 05/20/2009 1302   PROTEINUR NEGATIVE 05/20/2009 1302   UROBILINOGEN 1.0 05/20/2009 1302   NITRITE NEGATIVE 05/20/2009 1302   LEUKOCYTESUR LARGE (A) 05/20/2009 1302      Imaging Results:    Dg Chest 2 View  Result Date: 10/09/2017 CLINICAL DATA:  Left arm and left foot pain starting tonight at 7 p.m. Chest pain. EXAM: CHEST  2 VIEW COMPARISON:  09/20/2015 FINDINGS: Shallow inspiration with elevation of the left hemidiaphragm. Heart size and pulmonary vascularity are normal. Diffuse interstitial pattern to the lungs is similar to previous study and likely indicates fibrosis. Emphysematous changes in the lungs. Postoperative changes in the mediastinum and right breast. Loop recorder. Calcification of the aorta. No focal consolidation. Old right rib fracture. IMPRESSION: Emphysematous changes and fibrosis in the lungs. No focal consolidation. Aortic atherosclerosis. Electronically Signed   By: Burman Nieves M.D.   On: 10/09/2017 00:11   Ct Angio Chest/abd/pel For Dissection W And/or Wo Contrast  Result Date: 10/09/2017 CLINICAL DATA:  Left arm and left foot pain starting this evening. Additionally in the patient has back and chest pain. Rule out aortic dissection. EXAM: CT ANGIOGRAPHY CHEST, ABDOMEN AND PELVIS TECHNIQUE: Multidetector  CT imaging through the chest, abdomen and pelvis was performed using the standard protocol during bolus administration of intravenous contrast. Multiplanar reconstructed images and MIPs were obtained and reviewed to evaluate the vascular anatomy. CONTRAST:  100 cc  Isovue 370 IV COMPARISON:  None. FINDINGS: CTA CHEST FINDINGS Cardiovascular: Mild-to-moderate atherosclerosis of the thoracic aorta without aneurysm or dissection. Mild atherosclerotic calcifications at base of the great vessels. Normal branch pattern of the great vessels. Normal extracranial carotid arteries without significant stenosis to the extent visualized. Patent subclavian arteries bilaterally. No acute pulmonary embolus. Borderline cardiomegaly without pericardial effusion. There is coronary arteriosclerosis and mild left atrial enlargement. Mitral annuloplasty. Mediastinum/Nodes: No thyromegaly or mass. Patent trachea and mainstem bronchi. The esophagus is nonacute. No axillary, mediastinal or hilar lymphadenopathy. Small subcentimeter short axis lymph nodes are noted the largest is precarinal measuring approximately 9 mm short axis. Lungs/Pleura: Chronic interstitial prominence bilaterally with apical pleuroparenchymal scarring, right greater than left. No pneumonic consolidation, effusion or pneumothorax. Calcified pleural plaque along the periphery of the right hemithorax. Musculoskeletal: Osteopenic appearance of the bony thorax and dorsal spine. No acute osseous appearing abnormality. Review of the MIP images confirms the above findings. CTA ABDOMEN AND PELVIS FINDINGS VASCULAR Aorta: Mild-to-moderate atherosclerosis of the abdominal aorta with ectasia. No aneurysm or dissection. No significant stenosis. Celiac: Patent without evidence of aneurysm, dissection, vasculitis or significant stenosis. SMA: Patent without evidence of aneurysm, dissection, vasculitis or significant stenosis. Mild atherosclerosis at its origin. Renals: Single renal  arteries to both kidneys with atherosclerotic calcification at the origin of the right renal artery. Thin attenuated appearance of the right renal artery. No aneurysm, evidence of significant stenosis or fibromuscular dysplasia. IMA: Patent Inflow: Atherosclerosis the common iliac arteries without aneurysmal dilatation or significant stenosis. Veins: No obvious venous abnormality within the limitations of this arterial phase study. Review of the MIP images confirms the above findings. NON-VASCULAR Hepatobiliary: Status post cholecystectomy. Homogeneous enhancement of the liver without enhancing mass. Mild reservoir effect status post cholecystectomy. Pancreas: Atrophic pancreas without mass or ductal dilatation. Dilatation of the common bile duct may reflect post cholecystectomy change. No choledocholithiasis. Spleen: Normal in size without focal abnormality. Adrenals/Urinary Tract: Normal bilateral adrenal glands. Thinning of both renal cortices with malrotation of right kidney. Water attenuating cysts are noted bilaterally, the largest off the lower pole left kidney measuring 3 cm. Decompressed ureters. Physiologic distention of the urinary bladder. Stomach/Bowel: Nondistended stomach with normal small bowel rotation and ligament of Treitz. Increased colonic stool burden consistent with cough mild constipation. No mechanical source of obstruction. Staple line involving the distal sigmoid. The appendix is not confidently identified. Lymphatic: No lymphadenopathy. Reproductive: Hysterectomy.  No adnexal masses. Other: No free air or significant free fluid. Musculoskeletal: Lumbar spondylosis and levoscoliosis with multilevel degenerative disc disease from L1 through S1. No acute suspicious osseous abnormality. Review of the MIP images confirms the above findings. IMPRESSION: Vascular: 1. No acute pulmonary embolus, aortic aneurysm or dissection. 2. Mild-to-moderate atherosclerosis of the thoracic and abdominal  aorta. 3. Coronary arteriosclerosis. 4. Mild atherosclerosis at the origin of the right renal artery with slightly attenuated but patent right renal artery. 5. Patent celiac axis, SMA and IMA. Nonvascular: 1. Apical pleuroparenchymal scarring right greater than left. 2. Calcified pleural plaque along the periphery of the right hemithorax. Findings likely reflect changes of prior asbestos exposure. 3. Status post cholecystectomy with reservoir effect accounting for mild intra and extrahepatic ductal dilatation. 4. Left-sided renal cysts.  Malrotated right kidney. 5. Levoscoliosis and lumbar spondylosis. 6. Hysterectomy without adnexal mass Electronically Signed   By: Tollie Eth M.D.   On: 10/09/2017 02:50    My personal review of EKG: Rhythm NSR, nonspecific ST changes   Assessment & Plan:  Active Problems:   Chest pain   1. Chest pain-placed under observation, cycle cardiac enzymes.  Patient's chest pain has resolved at this time.  CTA chest was obtained which was negative for aortic dissection or pulmonary embolism. 2. Chronic back pain-start Vicodin 1 tablet every 6 hours as needed for pain. 3. Diabetes mellitus-start sliding scale insulin with NovoLog. 4. Hypothyroidism-continue Synthroid    DVT Prophylaxis-   Lovenox   AM Labs Ordered, also please review Full Orders  Family Communication: Admission, patients condition and plan of care including tests being ordered have been discussed with the patient * who indicate understanding and agree with the plan and Code Status.  Code Status: Full code  Admission status: Observation  Time spent in minutes : 50 minutes   Kandra Graven S M.D on 10/09/2017 at 5:11 AM  Between 7am to 7pm - Pager - [phone]. After 7pm go to [email or web address] - password [email or web address]  [facility] - Office  [phone]

## 2017-10-09 NOTE — ED Notes (Signed)
Dr Severiano Gilbert in to see pt. Family is present

## 2017-10-09 NOTE — ED Notes (Signed)
Pt ambulating with cane and SBA to BR

## 2017-10-14 ENCOUNTER — Ambulatory Visit (INDEPENDENT_AMBULATORY_CARE_PROVIDER_SITE_OTHER): Payer: PPO | Admitting: *Deleted

## 2017-10-14 DIAGNOSIS — R55 Syncope and collapse: Secondary | ICD-10-CM | POA: Diagnosis not present

## 2017-10-17 NOTE — Progress Notes (Signed)
Carelink Summary Report / Loop Recorder 

## 2017-10-20 LAB — CUP PACEART REMOTE DEVICE CHECK
Date Time Interrogation Session: 20181026233620
Implantable Pulse Generator Implant Date: 20160708

## 2017-11-14 ENCOUNTER — Ambulatory Visit (INDEPENDENT_AMBULATORY_CARE_PROVIDER_SITE_OTHER): Payer: PPO | Admitting: *Deleted

## 2017-11-14 DIAGNOSIS — R55 Syncope and collapse: Secondary | ICD-10-CM

## 2017-11-14 NOTE — Progress Notes (Signed)
Carelink Summary Report / Loop Recorder 

## 2017-11-28 LAB — CUP PACEART REMOTE DEVICE CHECK
Date Time Interrogation Session: 20181126003905
MDC IDC PG IMPLANT DT: 20160708

## 2017-12-02 DIAGNOSIS — J441 Chronic obstructive pulmonary disease with (acute) exacerbation: Secondary | ICD-10-CM | POA: Diagnosis not present

## 2017-12-02 DIAGNOSIS — E119 Type 2 diabetes mellitus without complications: Secondary | ICD-10-CM | POA: Diagnosis not present

## 2017-12-02 DIAGNOSIS — I5032 Chronic diastolic (congestive) heart failure: Secondary | ICD-10-CM | POA: Diagnosis not present

## 2017-12-02 DIAGNOSIS — Z6824 Body mass index (BMI) 24.0-24.9, adult: Secondary | ICD-10-CM | POA: Diagnosis not present

## 2017-12-02 DIAGNOSIS — I700 Atherosclerosis of aorta: Secondary | ICD-10-CM | POA: Diagnosis not present

## 2017-12-02 DIAGNOSIS — I10 Essential (primary) hypertension: Secondary | ICD-10-CM | POA: Diagnosis not present

## 2017-12-02 DIAGNOSIS — E038 Other specified hypothyroidism: Secondary | ICD-10-CM | POA: Diagnosis not present

## 2017-12-14 ENCOUNTER — Ambulatory Visit (INDEPENDENT_AMBULATORY_CARE_PROVIDER_SITE_OTHER): Payer: PPO | Admitting: *Deleted

## 2017-12-14 DIAGNOSIS — R55 Syncope and collapse: Secondary | ICD-10-CM

## 2017-12-14 NOTE — Progress Notes (Signed)
Carelink Summary Report / Loop Recorder 

## 2017-12-24 LAB — CUP PACEART REMOTE DEVICE CHECK
MDC IDC PG IMPLANT DT: 20160708
MDC IDC SESS DTM: 20181226003921

## 2018-01-12 ENCOUNTER — Ambulatory Visit (INDEPENDENT_AMBULATORY_CARE_PROVIDER_SITE_OTHER): Payer: PPO | Admitting: *Deleted

## 2018-01-12 DIAGNOSIS — R55 Syncope and collapse: Secondary | ICD-10-CM | POA: Diagnosis not present

## 2018-01-13 NOTE — Progress Notes (Signed)
Carelink Summary Report / Loop Recorder 

## 2018-01-20 LAB — CUP PACEART REMOTE DEVICE CHECK
Implantable Pulse Generator Implant Date: 20160708
MDC IDC SESS DTM: 20190125004037

## 2018-01-24 DIAGNOSIS — Z794 Long term (current) use of insulin: Secondary | ICD-10-CM | POA: Diagnosis not present

## 2018-01-24 DIAGNOSIS — H524 Presbyopia: Secondary | ICD-10-CM | POA: Diagnosis not present

## 2018-01-24 DIAGNOSIS — Z961 Presence of intraocular lens: Secondary | ICD-10-CM | POA: Diagnosis not present

## 2018-02-14 ENCOUNTER — Ambulatory Visit (INDEPENDENT_AMBULATORY_CARE_PROVIDER_SITE_OTHER): Payer: PPO | Admitting: *Deleted

## 2018-02-14 DIAGNOSIS — R55 Syncope and collapse: Secondary | ICD-10-CM | POA: Diagnosis not present

## 2018-02-15 NOTE — Progress Notes (Signed)
Carelink Summary Report / Loop Recorder 

## 2018-03-07 DIAGNOSIS — E038 Other specified hypothyroidism: Secondary | ICD-10-CM | POA: Diagnosis not present

## 2018-03-07 DIAGNOSIS — E119 Type 2 diabetes mellitus without complications: Secondary | ICD-10-CM | POA: Diagnosis not present

## 2018-03-07 DIAGNOSIS — J441 Chronic obstructive pulmonary disease with (acute) exacerbation: Secondary | ICD-10-CM | POA: Diagnosis not present

## 2018-03-07 DIAGNOSIS — Z6822 Body mass index (BMI) 22.0-22.9, adult: Secondary | ICD-10-CM | POA: Diagnosis not present

## 2018-03-07 DIAGNOSIS — I5032 Chronic diastolic (congestive) heart failure: Secondary | ICD-10-CM | POA: Diagnosis not present

## 2018-03-07 DIAGNOSIS — I10 Essential (primary) hypertension: Secondary | ICD-10-CM | POA: Diagnosis not present

## 2018-03-07 DIAGNOSIS — I700 Atherosclerosis of aorta: Secondary | ICD-10-CM | POA: Diagnosis not present

## 2018-03-07 DIAGNOSIS — Z6824 Body mass index (BMI) 24.0-24.9, adult: Secondary | ICD-10-CM | POA: Diagnosis not present

## 2018-03-10 IMAGING — CT CT ANGIO CHEST-ABD-PELV FOR DISSECTION W/ AND WO/W CM
2 of 7 series · 13 of 46 positions shown, 15 images · IV contrast (Isovue)
Comparison: None.

CLINICAL DATA: Left arm and left foot pain starting this evening.
Additionally in the patient has back and chest pain. Rule out aortic
dissection.

EXAM:
CT ANGIOGRAPHY CHEST, ABDOMEN AND PELVIS
TECHNIQUE: Multidetector CT imaging through the chest, abdomen and pelvis was
performed using the standard protocol during bolus administration of
intravenous contrast. Multiplanar reconstructed images and MIPs were
obtained and reviewed to evaluate the vascular anatomy.
CONTRAST:  100 cc Isovue 370 IV

[Series 5: dissection axial arterial · axial · arterial · 0.79mm/px · z∈[+952,+1477]mm · 10 of 197 slices shown, 12 images]
[im 11/197  soft-tissue]
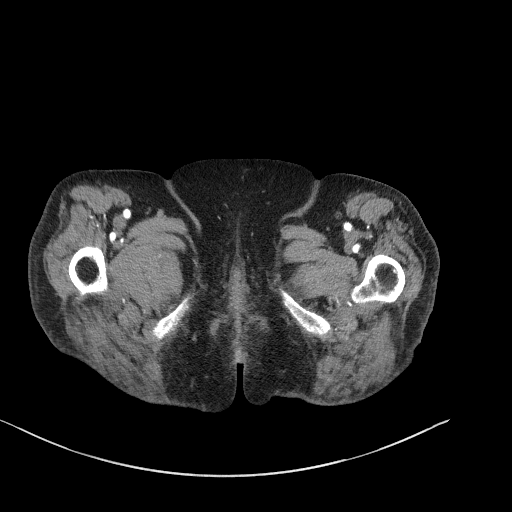
[im 11/197  bone]
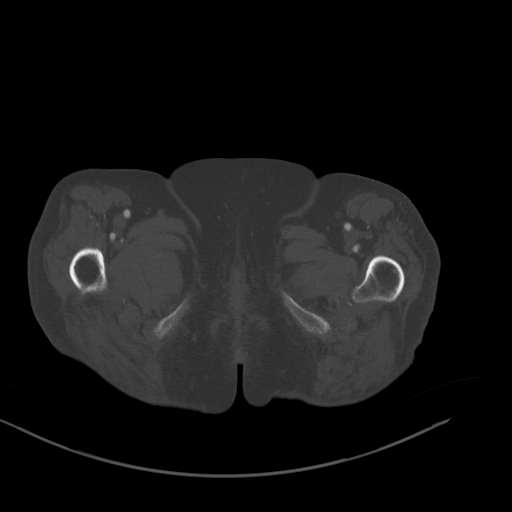
[im 31/197  soft-tissue]
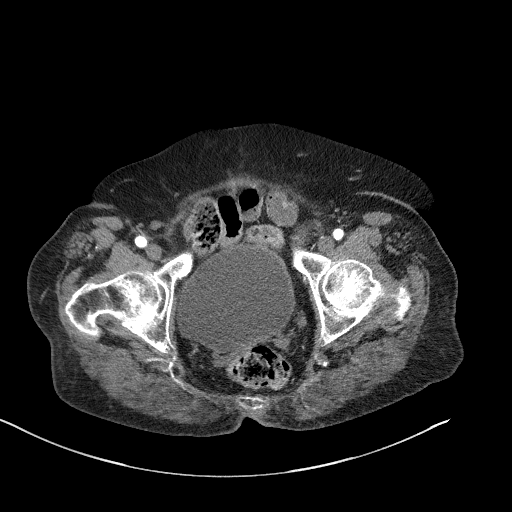
[im 52/197  soft-tissue]
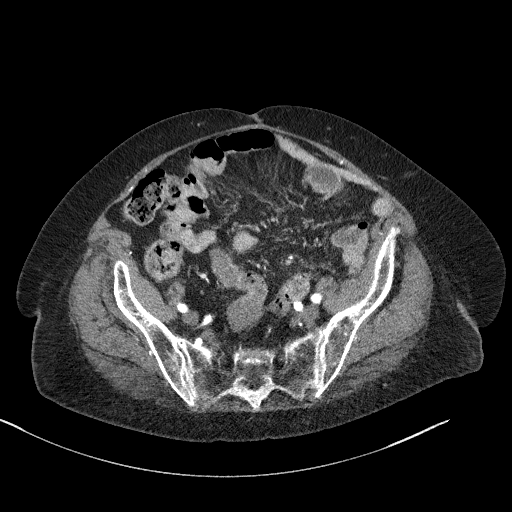
[im 73/197  soft-tissue]
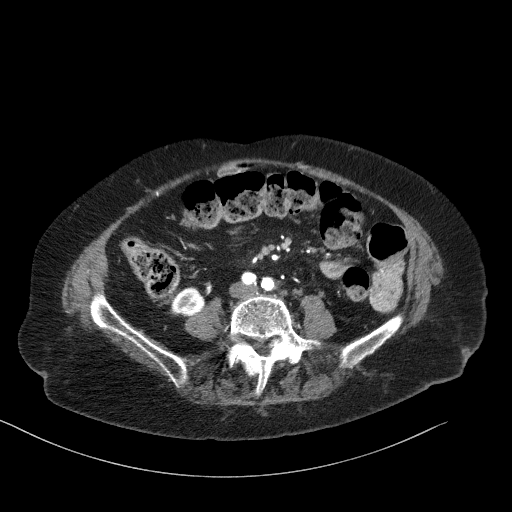
[im 93/197  soft-tissue]
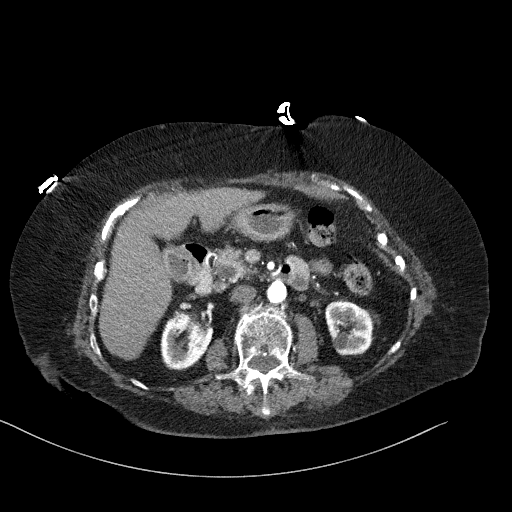
[im 104/197  soft-tissue]
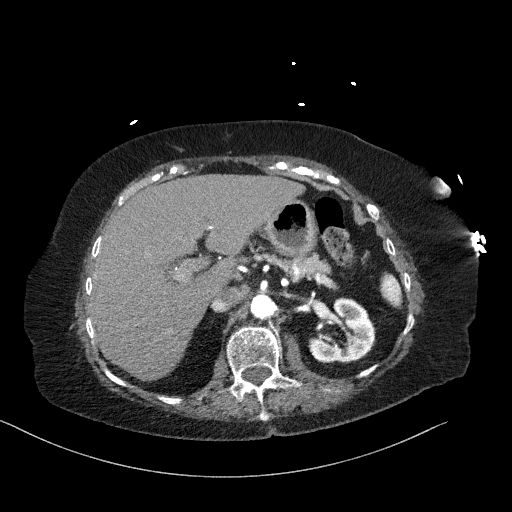
[im 124/197  soft-tissue]
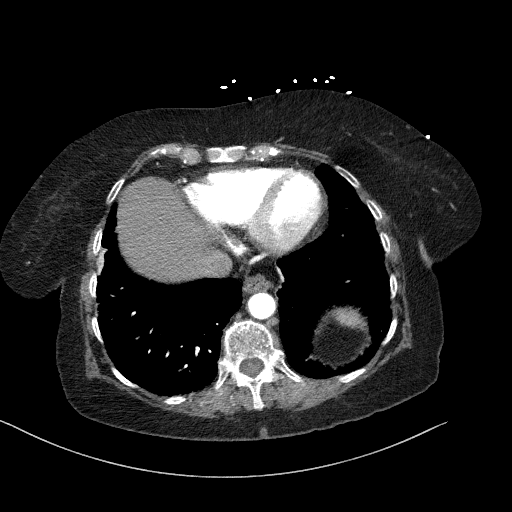
[im 145/197  soft-tissue]
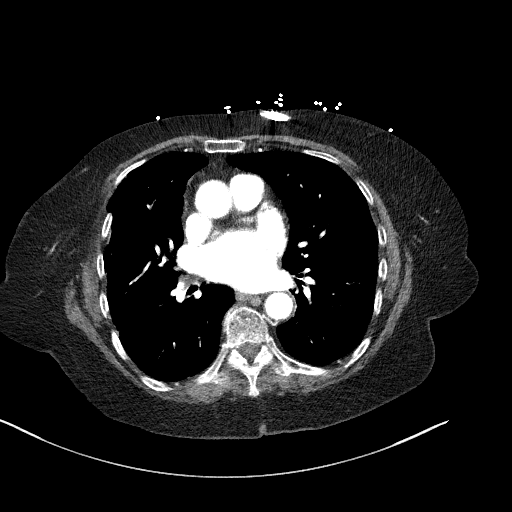
[im 166/197  soft-tissue]
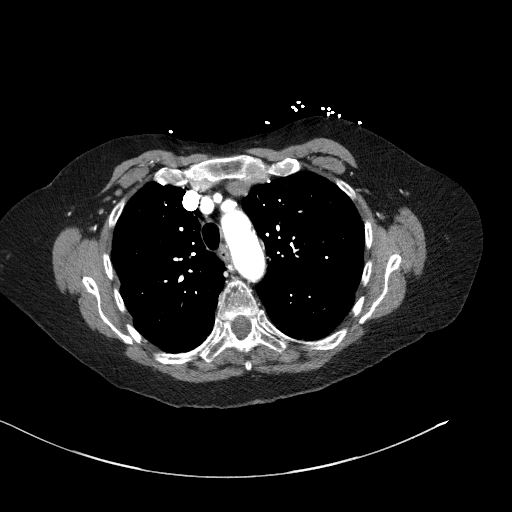
[im 166/197  bone]
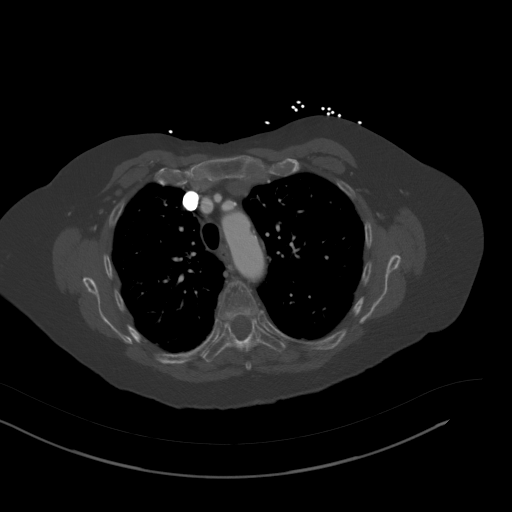
[im 186/197  soft-tissue]
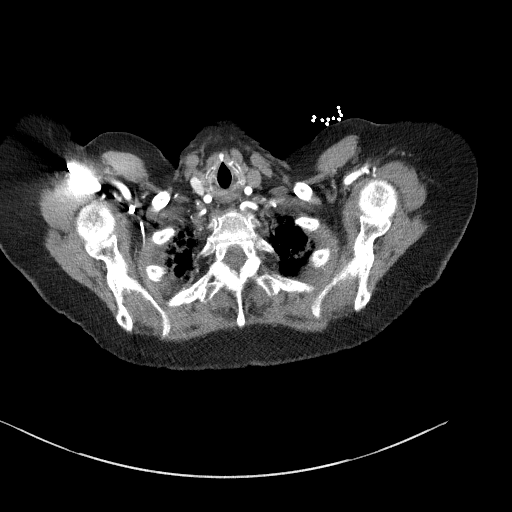

[Series 9: coronals · coronal · 0.83mm/px · 3 of 175 slices shown]
[im 44/175  soft-tissue]
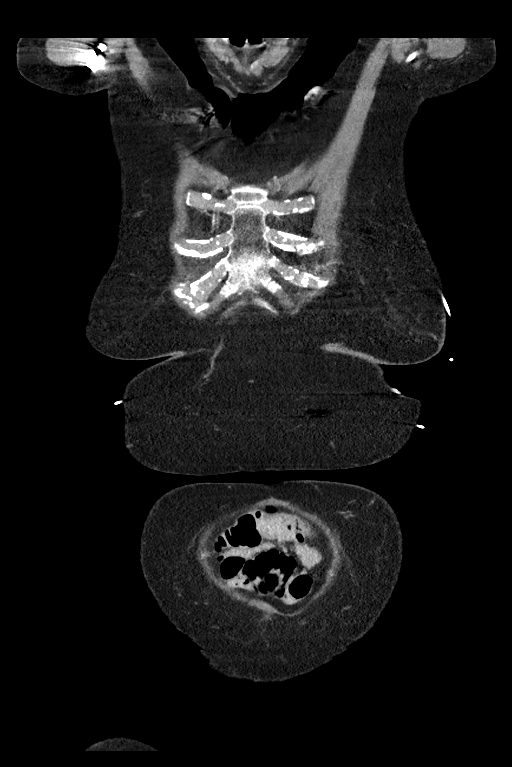
[im 88/175  soft-tissue]
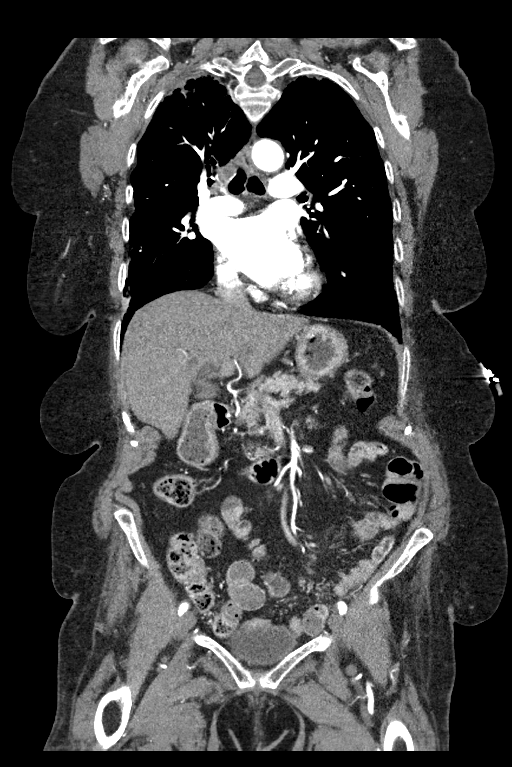
[im 131/175  soft-tissue]
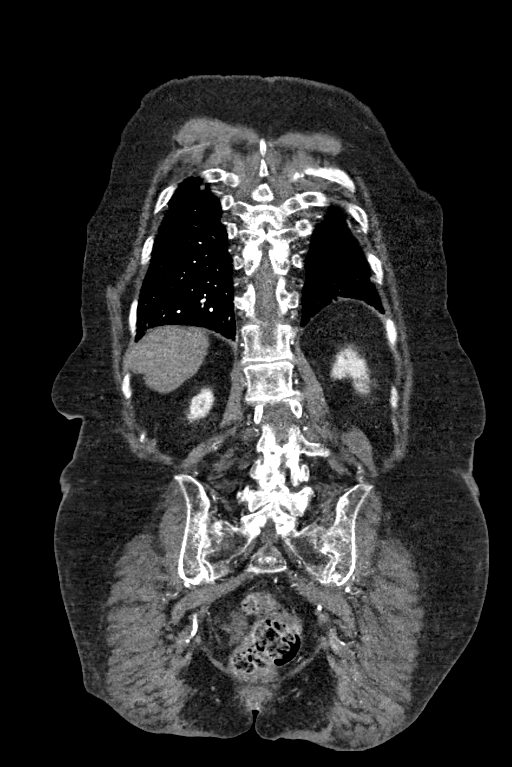

[13 of 46 positions shown; findings below may reference images not displayed]

FINDINGS: CTA CHEST FINDINGS

Cardiovascular: Mild-to-moderate atherosclerosis of the thoracic
aorta without aneurysm or dissection. Mild atherosclerotic
calcifications at base of the great vessels. Normal branch pattern
of the great vessels. Normal extracranial carotid arteries without
significant stenosis to the extent visualized. Patent subclavian
arteries bilaterally. No acute pulmonary embolus.

Borderline cardiomegaly without pericardial effusion. There is
coronary arteriosclerosis and mild left atrial enlargement. Mitral
annuloplasty.

Mediastinum/Nodes: No thyromegaly or mass. Patent trachea and
mainstem bronchi. The esophagus is nonacute. No axillary,
mediastinal or hilar lymphadenopathy. Small subcentimeter short axis
lymph nodes are noted the largest is precarinal measuring
approximately 9 mm short axis.

Lungs/Pleura: Chronic interstitial prominence bilaterally with
apical pleuroparenchymal scarring, right greater than left. No
pneumonic consolidation, effusion or pneumothorax. Calcified pleural
plaque along the periphery of the right hemithorax.

Musculoskeletal: Osteopenic appearance of the bony thorax and dorsal
spine. No acute osseous appearing abnormality.

Review of the MIP images confirms the above findings.

CTA ABDOMEN AND PELVIS FINDINGS

VASCULAR

Aorta: Mild-to-moderate atherosclerosis of the abdominal aorta with
ectasia. No aneurysm or dissection. No significant stenosis.

Celiac: Patent without evidence of aneurysm, dissection, vasculitis
or significant stenosis.

SMA: Patent without evidence of aneurysm, dissection, vasculitis or
significant stenosis. Mild atherosclerosis at its origin.

Renals: Single renal arteries to both kidneys with atherosclerotic
calcification at the origin of the right renal artery. Thin
attenuated appearance of the right renal artery. No aneurysm,
evidence of significant stenosis or fibromuscular dysplasia.

IMA: Patent

Inflow: Atherosclerosis the common iliac arteries without aneurysmal
dilatation or significant stenosis.

Veins: No obvious venous abnormality within the limitations of this
arterial phase study.

Review of the MIP images confirms the above findings.

NON-VASCULAR

Hepatobiliary: Status post cholecystectomy. Homogeneous enhancement
of the liver without enhancing mass. Mild reservoir effect status
post cholecystectomy.

Pancreas: Atrophic pancreas without mass or ductal dilatation.
Dilatation of the common bile duct may reflect post cholecystectomy
change. No choledocholithiasis.

Spleen: Normal in size without focal abnormality.

Adrenals/Urinary Tract: Normal bilateral adrenal glands. Thinning of
both renal cortices with malrotation of right kidney. Water
attenuating cysts are noted bilaterally, the largest off the lower
pole left kidney measuring 3 cm. Decompressed ureters. Physiologic
distention of the urinary bladder.

Stomach/Bowel: Nondistended stomach with normal small bowel rotation
and ligament of Treitz. Increased colonic stool burden consistent
with cough mild constipation. No mechanical source of obstruction.
Staple line involving the distal sigmoid. The appendix is not
confidently identified.

Lymphatic: No lymphadenopathy.

Reproductive: Hysterectomy.  No adnexal masses.

Other: No free air or significant free fluid.

Musculoskeletal: Lumbar spondylosis and levoscoliosis with
multilevel degenerative disc disease from L1 through S1. No acute
suspicious osseous abnormality.

Review of the MIP images confirms the above findings.
IMPRESSION: Vascular:

1. No acute pulmonary embolus, aortic aneurysm or dissection.
2. Mild-to-moderate atherosclerosis of the thoracic and abdominal
aorta.
3. Coronary arteriosclerosis.
4. Mild atherosclerosis at the origin of the right renal artery with
slightly attenuated but patent right renal artery.
5. Patent celiac axis, SMA and IMA.

Nonvascular:

1. Apical pleuroparenchymal scarring right greater than left.
2. Calcified pleural plaque along the periphery of the right
hemithorax. Findings likely reflect changes of prior asbestos
exposure.
3. Status post cholecystectomy with reservoir effect accounting for
mild intra and extrahepatic ductal dilatation.
4. Left-sided renal cysts.  Malrotated right kidney.
5. Levoscoliosis and lumbar spondylosis.
6. Hysterectomy without adnexal mass

## 2018-03-20 ENCOUNTER — Ambulatory Visit (INDEPENDENT_AMBULATORY_CARE_PROVIDER_SITE_OTHER): Payer: PPO | Admitting: *Deleted

## 2018-03-20 DIAGNOSIS — R55 Syncope and collapse: Secondary | ICD-10-CM

## 2018-03-20 NOTE — Progress Notes (Signed)
Carelink Summary Report / Loop Recorder 

## 2018-03-22 LAB — CUP PACEART REMOTE DEVICE CHECK
Date Time Interrogation Session: 20190227014156
Implantable Pulse Generator Implant Date: 20160708

## 2018-04-21 ENCOUNTER — Ambulatory Visit (INDEPENDENT_AMBULATORY_CARE_PROVIDER_SITE_OTHER): Payer: PPO | Admitting: *Deleted

## 2018-04-21 DIAGNOSIS — R55 Syncope and collapse: Secondary | ICD-10-CM

## 2018-04-24 LAB — CUP PACEART REMOTE DEVICE CHECK
Implantable Pulse Generator Implant Date: 20160708
MDC IDC SESS DTM: 20190401020556

## 2018-04-24 NOTE — Progress Notes (Signed)
Carelink Summary Report / Loop Recorder 

## 2018-05-16 LAB — CUP PACEART REMOTE DEVICE CHECK
MDC IDC PG IMPLANT DT: 20160708
MDC IDC SESS DTM: 20190504020616

## 2018-05-19 ENCOUNTER — Telehealth: Payer: Self-pay | Admitting: Cardiology

## 2018-05-19 NOTE — Telephone Encounter (Signed)
LMOVM requesting that pt send manual transmission b/c home monitor has not updated in at least 14 days.    

## 2018-05-22 NOTE — Telephone Encounter (Signed)
Patient called back but was transferred to [facility]  She wants to speak with someone about changing out recorder?

## 2018-05-24 ENCOUNTER — Ambulatory Visit (INDEPENDENT_AMBULATORY_CARE_PROVIDER_SITE_OTHER): Payer: PPO | Admitting: *Deleted

## 2018-05-24 DIAGNOSIS — R55 Syncope and collapse: Secondary | ICD-10-CM | POA: Diagnosis not present

## 2018-05-25 ENCOUNTER — Telehealth: Payer: Self-pay | Admitting: Internal Medicine

## 2018-05-25 NOTE — Progress Notes (Signed)
Carelink Summary Report / Loop Recorder 

## 2018-05-25 NOTE — Telephone Encounter (Signed)
Called patient to inform her that her battery is still "OK" in her ILR. I told her that we would contact her when she reaches RRT to schedule her an appt with Dr.Allred. Patient states that she is nervous that the device will harm her when it reaches RRT. I explained to her that the device will not harm her in any way. Patient verbalized understanding.

## 2018-05-25 NOTE — Telephone Encounter (Signed)
[name] called the [facility] office wanting to know when she was going to have her loop recorder taken out.  Please call patient.

## 2018-06-01 ENCOUNTER — Other Ambulatory Visit: Payer: Self-pay

## 2018-06-01 ENCOUNTER — Emergency Department (HOSPITAL_COMMUNITY): Payer: PPO

## 2018-06-01 ENCOUNTER — Encounter (HOSPITAL_COMMUNITY): Payer: Self-pay

## 2018-06-01 ENCOUNTER — Emergency Department (HOSPITAL_COMMUNITY)
Admission: EM | Admit: 2018-06-01 | Discharge: 2018-06-02 | Disposition: A | Discharge status: Home / Self Care | Payer: PPO | Attending: Emergency Medicine | Admitting: Emergency Medicine

## 2018-06-01 DIAGNOSIS — M5489 Other dorsalgia: Secondary | ICD-10-CM | POA: Diagnosis not present

## 2018-06-01 DIAGNOSIS — Z952 Presence of prosthetic heart valve: Secondary | ICD-10-CM | POA: Diagnosis not present

## 2018-06-01 DIAGNOSIS — E119 Type 2 diabetes mellitus without complications: Secondary | ICD-10-CM | POA: Insufficient documentation

## 2018-06-01 DIAGNOSIS — Z87891 Personal history of nicotine dependence: Secondary | ICD-10-CM | POA: Insufficient documentation

## 2018-06-01 DIAGNOSIS — R52 Pain, unspecified: Secondary | ICD-10-CM | POA: Diagnosis not present

## 2018-06-01 DIAGNOSIS — F329 Major depressive disorder, single episode, unspecified: Secondary | ICD-10-CM | POA: Diagnosis not present

## 2018-06-01 DIAGNOSIS — J9811 Atelectasis: Secondary | ICD-10-CM | POA: Diagnosis not present

## 2018-06-01 DIAGNOSIS — I700 Atherosclerosis of aorta: Secondary | ICD-10-CM | POA: Diagnosis not present

## 2018-06-01 DIAGNOSIS — M79603 Pain in arm, unspecified: Secondary | ICD-10-CM | POA: Diagnosis not present

## 2018-06-01 DIAGNOSIS — Z95810 Presence of automatic (implantable) cardiac defibrillator: Secondary | ICD-10-CM | POA: Insufficient documentation

## 2018-06-01 DIAGNOSIS — M542 Cervicalgia: Secondary | ICD-10-CM | POA: Diagnosis not present

## 2018-06-01 DIAGNOSIS — Z794 Long term (current) use of insulin: Secondary | ICD-10-CM | POA: Insufficient documentation

## 2018-06-01 DIAGNOSIS — M549 Dorsalgia, unspecified: Secondary | ICD-10-CM

## 2018-06-01 DIAGNOSIS — I509 Heart failure, unspecified: Secondary | ICD-10-CM | POA: Diagnosis not present

## 2018-06-01 DIAGNOSIS — R079 Chest pain, unspecified: Secondary | ICD-10-CM | POA: Insufficient documentation

## 2018-06-01 DIAGNOSIS — J449 Chronic obstructive pulmonary disease, unspecified: Secondary | ICD-10-CM | POA: Insufficient documentation

## 2018-06-01 DIAGNOSIS — R51 Headache: Secondary | ICD-10-CM | POA: Insufficient documentation

## 2018-06-01 DIAGNOSIS — Z79899 Other long term (current) drug therapy: Secondary | ICD-10-CM | POA: Diagnosis not present

## 2018-06-01 DIAGNOSIS — E039 Hypothyroidism, unspecified: Secondary | ICD-10-CM | POA: Insufficient documentation

## 2018-06-01 DIAGNOSIS — R519 Headache, unspecified: Secondary | ICD-10-CM

## 2018-06-01 NOTE — ED Triage Notes (Signed)
Pt reports she called ems due to having pain in the top of her head, down into her neck and into her back.  Pt states the pain started today, denies fall or injury

## 2018-06-01 NOTE — ED Notes (Signed)
Florian Buff (pt's son) call [phone] for any changes in condition or d/c

## 2018-06-02 ENCOUNTER — Emergency Department (HOSPITAL_COMMUNITY): Payer: PPO

## 2018-06-02 DIAGNOSIS — R51 Headache: Secondary | ICD-10-CM | POA: Diagnosis not present

## 2018-06-02 DIAGNOSIS — I700 Atherosclerosis of aorta: Secondary | ICD-10-CM | POA: Diagnosis not present

## 2018-06-02 DIAGNOSIS — R079 Chest pain, unspecified: Secondary | ICD-10-CM | POA: Diagnosis not present

## 2018-06-02 LAB — BASIC METABOLIC PANEL
ANION GAP: 10 (ref 5–15)
BUN: 21 mg/dL — ABNORMAL HIGH (ref 6–20)
CO2: 24 mmol/L (ref 22–32)
Calcium: 9.4 mg/dL (ref 8.9–10.3)
Chloride: 105 mmol/L (ref 101–111)
Creatinine, Ser: 0.9 mg/dL (ref 0.44–1.00)
GFR calc Af Amer: >60 mL/min (ref >60)
GFR, EST NON AFRICAN AMERICAN: 54 mL/min — AB (ref >60)
GLUCOSE: 113 mg/dL — AB (ref 65–99)
POTASSIUM: 3.8 mmol/L (ref 3.5–5.1)
Sodium: 139 mmol/L (ref 135–145)

## 2018-06-02 LAB — CBC
HEMATOCRIT: 36 % (ref 36.0–46.0)
HEMOGLOBIN: 11.7 g/dL — AB (ref 12.0–15.0)
MCH: 31.8 pg (ref 26.0–34.0)
MCHC: 32.5 g/dL (ref 30.0–36.0)
MCV: 97.8 fL (ref 78.0–100.0)
Platelets: 143 10*3/uL — ABNORMAL LOW (ref 150–400)
RBC: 3.68 MIL/uL — ABNORMAL LOW (ref 3.87–5.11)
RDW: 13.9 % (ref 11.5–15.5)
WBC: 6.2 10*3/uL (ref 4.0–10.5)

## 2018-06-02 LAB — TROPONIN I: Troponin I: <0.03 ng/mL (ref <0.03)

## 2018-06-02 MED ORDER — IOPAMIDOL (ISOVUE-370) INJECTION 76%
100.0000 mL | Freq: Once | INTRAVENOUS | Status: AC | PRN
  Administered 2018-06-02: 100 mL via INTRAVENOUS

## 2018-06-02 NOTE — ED Notes (Signed)
Notified that son is here to take patient home.

## 2018-06-02 NOTE — ED Provider Notes (Signed)
[place] [facility] EMERGENCY DEPARTMENT Provider Note   CSN: [id number] Arrival date & time: 06/01/18  2001     History   Chief Complaint Chief Complaint  Patient presents with  . Headache    neck, back pain    HPI [name] is a 84 y.o. female.  Patient presents to the emergency department with multiple complaints.  Patient reports that she has been experiencing intermittent headache, neck pain, back pain and chest pain..  Symptoms come and go.  She first noticed the pains yesterday.  She denies any injury.  She has not been having associated shortness of breath.  He has not had any long-term vision changes and there is no jaw claudication.     Past Medical History:  Diagnosis Date  . Arrhythmia    Sinus tachycardia  . Atrial fibrillation (HCC)    status post Cox-Maze procedure maintaining normal sinus rhythm, amiodarone and Coumadin discontinued.  . CHF (congestive heart failure) (HCC)    normal LV systolic function  . COPD (chronic obstructive pulmonary disease) (HCC)   . Degenerative lumbar spinal stenosis    Severe degenerative disk disease of lumbosacral spine  . Diabetes (HCC)   . GERD (gastroesophageal reflux disease)   . Hypothyroidism   . PFO (patent foramen ovale)    status post PFO closure at time of surgery  . Rheumatic heart disease    s/p right thoracotomy and mitral valve replacement with Medtronic Mosaic porcine tissue valve  . Syncope    recurrent unexplained syncope    Patient Active Problem List   Diagnosis Date Noted  . Chest pain 10/09/2017  . Syncope 10/24/2015  . Faintness   . PFO (patent foramen ovale)   . Atrial fibrillation (HCC)   . GERD (gastroesophageal reflux disease)   . COPD (chronic obstructive pulmonary disease) (HCC)   . Hypothyroidism   . Insomnia 09/23/2011  . DEPRESSION, SITUATIONAL, ACUTE 04/20/2010  . RHEUMATIC HEART DISEASE 11/10/2009  . CHF 11/10/2009  . COPD 11/10/2009  . SPINAL STENOSIS 11/10/2009  . MITRAL  VALVE REPLACEMENT, HX OF 11/10/2009    Past Surgical History:  Procedure Laterality Date  . EP IMPLANTABLE DEVICE N/A 06/27/2015   MDT LINQ ILR implanted by Dr Johney Frame for unexplained syncope  . EXPLORATORY LAPAROTOMY    . FOOT SURGERY     left foot  . MITRAL VALVE REPLACEMENT    . PERCUTANEOUS BALLOON VALVULOPLASTY  1993   Mitral valve     OB History   None      Home Medications    Prior to Admission medications   Medication Sig Start Date End Date Taking? Authorizing Provider  calcium-vitamin D (OSCAL WITH D) 500-200 MG-UNIT tablet Take 1 tablet by mouth 2 (two) times daily.    [provider]  Cholecalciferol (VITAMIN D3) 2000 UNITS TABS Take 1 tablet by mouth 2 (two) times daily.     [provider]  CRANBERRY FRUIT CONCENTRATE PO Take 1 capsule by mouth 2 (two) times daily.     [provider]  diphenoxylate-atropine (LOMOTIL) 2.5-0.025 MG per tablet Take 1 tablet by mouth 4 (four) times daily as needed for diarrhea or loose stools.  11/20/13   [provider]  hydroxypropyl methylcellulose / hypromellose (ISOPTO TEARS / GONIOVISC) 2.5 % ophthalmic solution Place 1 drop into both eyes as needed for dry eyes.    [provider]  insulin NPH (HUMULIN N) 100 UNIT/ML injection Inject 40 Units into the skin daily.  [provider]  levothyroxine (SYNTHROID, LEVOTHROID) 100 MCG tablet Take 100 mcg by mouth daily before breakfast.     [provider]  LORazepam (ATIVAN) 0.5 MG tablet Take 0.5 mg by mouth at bedtime as needed for anxiety or sleep.     [provider]  metoprolol succinate (TOPROL-XL) 25 MG 24 hr tablet Take 1 tablet (25 mg total) by mouth daily. 02/10/16   [name], MD  Multiple Vitamin (MULTIVITAMIN) tablet Take 1 tablet by mouth daily.      [provider]  nitroGLYCERIN (NITROSTAT) 0.4 MG SL tablet Place 0.4 mg under the tongue every 5 (five) minutes x 3 doses as needed for  chest pain.    [provider]  Omega-3 Fatty Acids (FISH OIL) 1200 MG CAPS Take 1 capsule by mouth 2 (two) times daily.     [provider]  pantoprazole (PROTONIX) 40 MG tablet Take 1 tablet (40 mg total) by mouth daily. 10/09/17 11/08/17  [name], MD  potassium chloride (KLOR-CON) 10 MEQ CR tablet Take 10 mEq by mouth daily.      [provider]  ranitidine (ZANTAC) 300 MG capsule Take 300 mg by mouth 2 (two) times daily.      [provider]  metoprolol tartrate (LOPRESSOR) 25 MG tablet Take 12.5 mg by mouth daily. 09/23/11 02/25/12  [name], MD  traZODone (DESYREL) 50 MG tablet May take 1/2 tab (25mg ) as needed for insomnia.  May increase to one tab (50mg ) if needed. 09/23/11 02/25/12  [name], MD    Family History Family History  Problem Relation Age of Onset  . Heart disease Father   . Heart attack Father   . Heart disease Brother   . Heart attack Brother   . Heart attack Son   . Heart disease Son   . Coronary artery disease Neg Hx   . Diabetes Neg Hx   . Hypertension Neg Hx     Social History Social History   Tobacco Use  . Smoking status: Former Smoker    Packs/day: 0.30    Years: 18.00    Pack years: 5.40    Types: Cigarettes    Start date: 05/26/1977    Last attempt to quit: 12/20/1988    Years since quitting: 29.4  . Smokeless tobacco: Never Used  Substance Use Topics  . Alcohol use: No    Alcohol/week: 0.0 oz  . Drug use: No     Allergies   Sulfonamide derivatives and Aspirin   Review of Systems Review of Systems  Musculoskeletal: Positive for back pain and neck pain.  Neurological: Positive for headaches.  All other systems reviewed and are negative.    Physical Exam Updated Vital Signs BP (!) 146/67   Pulse 76   Temp 98 F (36.7 C) (Oral)   Resp 19   Ht 5\' 2"  (1.575 m)   Wt 63.5 kg (140 lb)   SpO2 98%   BMI 25.61 kg/m   Physical Exam  Constitutional: She is oriented to person,  place, and time. She appears well-developed and well-nourished. No distress.  HENT:  Head: Normocephalic and atraumatic.  Right Ear: Hearing normal.  Left Ear: Hearing normal.  Nose: Nose normal.  Mouth/Throat: Oropharynx is clear and moist and mucous membranes are normal.  Eyes: Pupils are equal, round, and reactive to light. Conjunctivae and EOM are normal.  Neck: Normal range of motion. Neck supple.  Cardiovascular: Regular rhythm, S1  normal and S2 normal. Exam reveals no gallop and no friction rub.  No murmur heard. Pulmonary/Chest: Effort normal and breath sounds normal. No respiratory distress. She exhibits no tenderness.  Abdominal: Soft. Normal appearance and bowel sounds are normal. There is no hepatosplenomegaly. There is no tenderness. There is no rebound, no guarding, no tenderness at McBurney's point and negative Murphy's sign. No hernia.  Musculoskeletal: Normal range of motion.  Neurological: She is alert and oriented to person, place, and time. She has normal strength. No cranial nerve deficit or sensory deficit. Coordination normal. GCS eye subscore is 4. GCS verbal subscore is 5. GCS motor subscore is 6.  Skin: Skin is warm, dry and intact. No rash noted. No cyanosis.  Psychiatric: She has a normal mood and affect. Her speech is normal and behavior is normal. Thought content normal.  Nursing note and vitals reviewed.    ED Treatments / Results  Labs (all labs ordered are listed, but only abnormal results are displayed) Labs Reviewed  BASIC METABOLIC PANEL - Abnormal; Notable for the following components:      Result Value   Glucose, Bld 113 (*)    BUN 21 (*)    GFR calc non Af Amer 54 (*)    All other components within normal limits  CBC - Abnormal; Notable for the following components:   RBC 3.68 (*)    Hemoglobin 11.7 (*)    Platelets 143 (*)    All other components within normal limits  TROPONIN I    EKG None  ED ECG REPORT   Date: 06/02/2018  Rate:  74  Rhythm: normal sinus rhythm  QRS Axis: normal  Intervals: normal  ST/T Wave abnormalities: nonspecific ST changes  Conduction Disutrbances:none  Narrative Interpretation:   Old EKG Reviewed: unchanged  I have personally reviewed the EKG tracing and agree with the computerized printout as noted.   Radiology Dg Chest 2 View  Result Date: 06/01/2018 CLINICAL DATA:  Headache. EXAM: CHEST - 2 VIEW COMPARISON:  October 08, 2017 chest x-ray and October 09, 2017 chest CT FINDINGS: Scarring in the right greater than left apex is stable. The heart, hila, and mediastinum are normal. No pulmonary nodules or masses. Mild atelectasis in the left base. No other acute abnormalities. IMPRESSION: No active cardiopulmonary disease. Electronically Signed   By: Gerome Sam III M.D   On: 06/01/2018 23:07   Ct Head Wo Contrast  Result Date: 06/02/2018 CLINICAL DATA:  Headache EXAM: CT HEAD WITHOUT CONTRAST TECHNIQUE: Contiguous axial images were obtained from the base of the skull through the vertex without intravenous contrast. COMPARISON:  CT brain 05/28/2015 FINDINGS: Brain: No acute territorial infarction, hemorrhage or intracranial mass. Moderate atrophy. Mild small vessel ischemic changes of the white matter. Stable ventricle size. Old lacunar infarct in the right thalamus. Vascular: No hyperdense vessel. Vertebral artery and carotid vascular calcification Skull: Normal. Negative for fracture or focal lesion. Sinuses/Orbits: Mucosal thickening in the ethmoid sinuses. No acute orbital abnormality Other: None IMPRESSION: 1. No CT evidence for acute intracranial abnormality. 2. Atrophy with small vessel ischemic changes of the white matter Electronically Signed   By: Jasmine Pang M.D.   On: 06/02/2018 01:50   Ct Angio Chest/abd/pel For Dissection W &/or Wo Contrast  Result Date: 06/02/2018 CLINICAL DATA:  84 year old with chest pain. EXAM: CT ANGIOGRAPHY CHEST, ABDOMEN AND PELVIS TECHNIQUE: Multidetector  CT imaging through the chest, abdomen and pelvis was performed using the standard protocol during bolus administration of intravenous contrast. Multiplanar  reconstructed images and MIPs were obtained and reviewed to evaluate the vascular anatomy. CONTRAST:  ISOVUE-370 IOPAMIDOL (ISOVUE-370) INJECTION 76% COMPARISON:  Radiographs yesterday. Dissection protocol CT 8 months prior 10/09/2017 FINDINGS: CTA CHEST FINDINGS Cardiovascular: Diffuse aortic atherosclerosis without aneurysm, dissection, aortic hematoma or acute aortic syndrome. Normal branching pattern from the aortic arch. Prosthetic mitral valve. There are coronary artery calcifications. No central filling defects in the pulmonary arteries to suggest pulmonary embolus. Heart is normal in size. No pericardial effusion. Mediastinum/Nodes: No enlarged mediastinal or hilar lymph nodes. Esophagus is partially distended. Thyroid gland is normal. Lungs/Pleura: Stable biapical pleuroparenchymal scarring. Mild emphysema and bronchial thickening. 5 mm left lower lobe pulmonary nodule image 101 series 7 may have been present on prior exam, but was not seen on June 20, 2012 CT. Scarring with partially calcified plaques throughout the right hemithorax unchanged. No confluent consolidation. No evidence pulmonary edema. The trachea and mainstem bronchi are patent. Musculoskeletal: There are no acute or suspicious osseous abnormalities. Review of the MIP images confirms the above findings. CTA ABDOMEN AND PELVIS FINDINGS VASCULAR Aorta: Moderate to advanced atherosclerosis without aneurysm or dissection. No evidence of vasculitis or significant stenosis. Celiac: Patent without evidence of aneurysm, dissection, vasculitis or significant stenosis. SMA: Patent without evidence of aneurysm, dissection, vasculitis or significant stenosis. Renals: Both renal arteries are patent without evidence of aneurysm, dissection, vasculitis, fibromuscular dysplasia or significant  stenosis. IMA: Patent without evidence of aneurysm, dissection, vasculitis or significant stenosis. Inflow: Patent without evidence of aneurysm, dissection, vasculitis or significant stenosis. Veins: No obvious venous abnormality within the limitations of this arterial phase study. Review of the MIP images confirms the above findings. NON-VASCULAR Hepatobiliary: No focal lesion on arterial phase imaging. Postcholecystectomy. Biliary prominence is unchanged from prior exam and likely sequela of prior cholecystectomy. Pancreas: Parenchymal atrophy. No ductal dilatation or inflammation. Spleen: Normal in size and arterial phase enhancement. Adrenals/Urinary Tract: No adrenal nodule. No hydronephrosis or perinephric edema. Ptotic right kidney, unchanged. There are bilateral renal cysts. Ureters are decompressed. Urinary bladder is partially distended without wall thickening. Stomach/Bowel: Stomach is physiologically distended. No bowel wall thickening, inflammatory change or obstruction. Moderate colonic stool burden. Enteric sutures in the sigmoid. Appendix not definitively visualized. Lymphatic: No enlarged abdominal or pelvic lymph nodes. Reproductive: Status post hysterectomy. No adnexal masses. Other: No free air, free fluid, or intra-abdominal fluid collection. Musculoskeletal: Multilevel degenerative change throughout spine. There are no acute or suspicious osseous abnormalities. Review of the MIP images confirms the above findings. IMPRESSION: 1. Moderate thoracoabdominal aortic atherosclerosis without acute aortic abnormality. Particularly, no dissection. No pulmonary embolus. 2. No acute findings in the chest, abdomen, or pelvis. 3. Left lower lobe 5 mm pulmonary nodule, may have been present on recent prior exam, but not seen on 2013 CT. No follow-up needed if patient is low-risk. Non-contrast chest CT can be considered in 12 months if patient is high-risk. This recommendation follows the consensus statement:  Guidelines for Management of Incidental Pulmonary Nodules Detected on CT Images: From the Fleischner Society 2017; Radiology 2017; 284:228-243. 4. Additional chronic findings are unchanged, as described. Electronically Signed   By: Rubye Oaks M.D.   On: 06/02/2018 03:40    Procedures Procedures (including critical care time)  Medications Ordered in ED Medications  iopamidol (ISOVUE-370) 76 % injection 100 mL (100 mLs Intravenous Contrast Given 06/02/18 0119)     Initial Impression / Assessment and Plan / ED Course  I have reviewed the triage vital signs and the nursing notes.  Pertinent labs & imaging results that were available during my care of the patient were reviewed by me and considered in my medical decision making (see chart for details).     Patient appears well.  She is complaining of headache, neck pain, back pain and chest pain.  These pains occur at different times and appear to last for short period of time and then resolve spontaneously.  She does not have any focal neurologic findings.  Pain is not continuous, no associated vision change or jaw claudication.  Presentation does not fit with temporal arteritis.  Cardiac evaluation unremarkable.  CT head performed for headache, no acute abnormality noted.  Because she had pain in the back and chest, CT angiography of chest, abdomen, pelvis performed to rule out aortic dissection, Cetera.  No acute abnormality noted.  Patient has had normal vital signs here in the ER.  She appears comfortable, has had some intermittent twinges of pain but nothing continuous.  Patient will be appropriate for discharge and follow-up with her primary care doctor.  Final Clinical Impressions(s) / ED Diagnoses   Final diagnoses:  Acute nonintractable headache, unspecified headache type  Chest pain, unspecified type  Acute back pain, unspecified back location, unspecified back pain laterality    ED Discharge Orders    None       Gilda Crease, MD 06/02/18 [phone]

## 2018-06-08 ENCOUNTER — Telehealth: Payer: Self-pay | Admitting: Cardiology

## 2018-06-08 NOTE — Telephone Encounter (Signed)
LMOVM requesting that pt send manual transmission b/c home monitor has not updated in at least 14 days.    

## 2018-06-09 ENCOUNTER — Telehealth: Payer: Self-pay

## 2018-06-09 ENCOUNTER — Telehealth: Payer: Self-pay | Admitting: Internal Medicine

## 2018-06-09 NOTE — Telephone Encounter (Signed)
[name], CMA called patient at ~ 1338,  LMTCB/sss

## 2018-06-09 NOTE — Telephone Encounter (Signed)
I  Returned patient phone call but she did not answer. I did leave a voicemail for her to call back.

## 2018-06-09 NOTE — Telephone Encounter (Signed)
Patient returned call to the device clinic.  She is requesting a call before 2pm today due to a meeting she has to attend.

## 2018-06-12 ENCOUNTER — Telehealth: Payer: Self-pay

## 2018-06-12 DIAGNOSIS — E119 Type 2 diabetes mellitus without complications: Secondary | ICD-10-CM | POA: Diagnosis not present

## 2018-06-12 DIAGNOSIS — E038 Other specified hypothyroidism: Secondary | ICD-10-CM | POA: Diagnosis not present

## 2018-06-12 DIAGNOSIS — I5032 Chronic diastolic (congestive) heart failure: Secondary | ICD-10-CM | POA: Diagnosis not present

## 2018-06-12 DIAGNOSIS — M545 Low back pain: Secondary | ICD-10-CM | POA: Diagnosis not present

## 2018-06-12 DIAGNOSIS — I10 Essential (primary) hypertension: Secondary | ICD-10-CM | POA: Diagnosis not present

## 2018-06-12 DIAGNOSIS — Z0000 Encounter for general adult medical examination without abnormal findings: Secondary | ICD-10-CM | POA: Diagnosis not present

## 2018-06-12 DIAGNOSIS — I700 Atherosclerosis of aorta: Secondary | ICD-10-CM | POA: Diagnosis not present

## 2018-06-12 DIAGNOSIS — J441 Chronic obstructive pulmonary disease with (acute) exacerbation: Secondary | ICD-10-CM | POA: Diagnosis not present

## 2018-06-12 NOTE — Telephone Encounter (Signed)
[name] is upset that her loop recorder has been implanted for 3 years and nothing has been done. Battery status- ok. I advised her that the device can stay in place indefinitely after the battery is depleted or that she could have it removed. She complained that no one was clear with her about the process and became emotional about her family dynamics. She reports that she doesn't have the money to pay for a procedure and it is difficult to arrange transportation. I advised her that we would continue to monitor her loop recorder and when it was time we would contact her about the next steps. She is agreeable. I gave her the direct number to the device clinic.

## 2018-06-12 NOTE — Telephone Encounter (Signed)
Patient called about having her Loop Recorder removed. I let her talked to device tech nurse.

## 2018-06-14 ENCOUNTER — Encounter: Payer: Self-pay | Admitting: *Deleted

## 2018-06-16 ENCOUNTER — Other Ambulatory Visit: Payer: Self-pay | Admitting: *Deleted

## 2018-06-16 NOTE — Telephone Encounter (Signed)
This encounter was created in error - please disregard.

## 2018-06-16 NOTE — Patient Outreach (Signed)
[facility]) Care Management  06/16/2018  [name] 12-05-1925 [id number]  Referral via [facility] Utilization Management-Antonette Louellen Molder; Reason: Member has spinal stenosis & lives alone. ? Home health  Assistance.  Recent ED visit 6/13 with complaints of headache, neck pain, back pain and chest pain.  Other dxs: PFO, COPD, CHF, Severe degenerative disc disease, GERD, Rheumatic heart, DM2, Syncope.  Telephone call to patient who was advised of reason for call & of [facility] care management services.  HIPPA verification received.   Patient states she lives alone in senior apartment and is independent of her personal care. States she uses walker to get around. States she gets prescriptions delivered to her home from [facility]. Voices she manages her own medications and takes as ordered by her MD. States she checks her blood sugars and gives her self insulin injections. States her son takes her to MD appointments and takes her grocery shopping. Voices that her daughter-in-law cleans her apartment and helps with her meals.   States she has lots of back pain caused by spinal stenosis that sometimes keeps her up at night. States she does not like taking heavy duty pain medications.  Patient states she would like [facility] services and prefers case manager to come to her home.  Assessment: -Lives alone _Dx spinal stenosis with chronic pain, syncope -Falls risk-uses walker to ambulate -Diabetes-insulin dependent -patient needs in home assessment/evaluation for further needs.  Plan; Referral to [facility] care coordinator for in home assessment/evaluation for further needs. Patient is falls risk. Lives alone.  Colleen Can, RN BSN CCM Care Management Coordinator [facility] Care Management  [phone]

## 2018-06-19 ENCOUNTER — Other Ambulatory Visit: Payer: Self-pay | Admitting: *Deleted

## 2018-06-19 NOTE — Patient Outreach (Signed)
Telephone call to pt to schedule initial home visit, spoke with pt, HIPAA verified, home visit scheduled for next week.  PLAN See pt for home visit next week  [name] [facility], BSN [facility] Care Coordinator [phone]

## 2018-06-26 ENCOUNTER — Ambulatory Visit (INDEPENDENT_AMBULATORY_CARE_PROVIDER_SITE_OTHER): Payer: PPO | Admitting: *Deleted

## 2018-06-26 DIAGNOSIS — R55 Syncope and collapse: Secondary | ICD-10-CM

## 2018-06-27 NOTE — Progress Notes (Signed)
Carelink Summary Report / Loop Recorder 

## 2018-06-29 LAB — CUP PACEART REMOTE DEVICE CHECK
Date Time Interrogation Session: 20190606020636
Implantable Pulse Generator Implant Date: 20160708

## 2018-06-30 ENCOUNTER — Encounter: Payer: Self-pay | Admitting: *Deleted

## 2018-06-30 ENCOUNTER — Other Ambulatory Visit: Payer: Self-pay | Admitting: *Deleted

## 2018-06-30 NOTE — Patient Outreach (Signed)
[facility]) Care Management   06/30/2018  [name] 1925/12/04 [id number]  [name] is an 84 y.o. female  Subjective: Initial home visit with pt, HIPAA verified, pt reports " my biggest problem is back pain so bad I can't stand it"  Pt states she refuses surgery and will not take narcotics, does not take over the counter pain medications, uses heat and cold therapy, states " I've been to outpatient therapy and to specialists, nothing helps, I have spinal stenosis"  Pt states she has not fallen in 3 years and she uses walker and cane.  Pt lives alone in apartments for the elderly, son and daughter in law assists with ADL's, IADL's as needed, pt reports independent with bathing, cooking, dressing.  Pt rides [employer] van for errands, shopping.   Objective:   Vitals:   06/30/18 1210  BP: (!) 100/54  Pulse: 69  Resp: 16  Weight: 127 lb (57.6 kg)  Height: 1.575 m (5\' 2" )   ROS  Physical Exam  Constitutional: She is oriented to person, place, and time. She appears well-developed.  HENT:  Head: Normocephalic.  Neck: Normal range of motion. Neck supple.  Cardiovascular: Normal rate and regular rhythm.  Respiratory: Effort normal and breath sounds normal.  GI: Bowel sounds are normal.  Musculoskeletal: She exhibits edema.  Slight edema left ankle area  Neurological: She is alert and oriented to person, place, and time.  Skin: Skin is warm and dry.  Varicose veins LE BL  Psychiatric: She has a normal mood and affect. Her behavior is normal. Judgment and thought content normal.    Encounter Medications:   Outpatient Encounter Medications as of 06/30/2018  Medication Sig  . calcium-vitamin D (OSCAL WITH D) 500-200 MG-UNIT tablet Take 1 tablet by mouth 2 (two) times daily.  . Cholecalciferol (VITAMIN D3) 2000 UNITS TABS Take 1 tablet by mouth 2 (two) times daily.   08/31/2018 CRANBERRY FRUIT CONCENTRATE PO Take 1 capsule by mouth 2 (two) times daily.   .  diphenoxylate-atropine (LOMOTIL) 2.5-0.025 MG per tablet Take 1 tablet by mouth 4 (four) times daily as needed for diarrhea or loose stools.   . hydroxypropyl methylcellulose / hypromellose (ISOPTO TEARS / GONIOVISC) 2.5 % ophthalmic solution Place 1 drop into both eyes as needed for dry eyes.  . insulin NPH (HUMULIN N) 100 UNIT/ML injection Inject 40 Units into the skin daily.   03[birth year]1990 levothyroxine (SYNTHROID, LEVOTHROID) 100 MCG tablet Take 100 mcg by mouth daily before breakfast.   . LORazepam (ATIVAN) 0.5 MG tablet Take 0.5 mg by mouth at bedtime as needed for anxiety or sleep.   . metoprolol succinate (TOPROL-XL) 25 MG 24 hr tablet Take 1 tablet (25 mg total) by mouth daily.  . Multiple Vitamin (MULTIVITAMIN) tablet Take 1 tablet by mouth daily.    . Omega-3 Fatty Acids (FISH OIL) 1200 MG CAPS Take 1 capsule by mouth 2 (two) times daily.   . potassium chloride (KLOR-CON) 10 MEQ CR tablet Take 10 mEq by mouth daily.    . ranitidine (ZANTAC) 300 MG capsule Take 300 mg by mouth 2 (two) times daily.    . nitroGLYCERIN (NITROSTAT) 0.4 MG SL tablet Place 0.4 mg under the tongue every 5 (five) minutes x 3 doses as needed for chest pain.  . pantoprazole (PROTONIX) 40 MG tablet Take 1 tablet (40 mg total) by mouth daily.  . [DISCONTINUED] metoprolol tartrate (LOPRESSOR) 25 MG tablet Take 12.5 mg by mouth daily.  . [DISCONTINUED]  traZODone (DESYREL) 50 MG tablet May take 1/2 tab (25mg ) as needed for insomnia.  May increase to one tab (50mg ) if needed.   No facility-administered encounter medications on file as of 06/30/2018.     Functional Status:   In your present state of health, do you have any difficulty performing the following activities: 06/30/2018 06/16/2018  Hearing? N N  Vision? N N  Difficulty concentrating or making decisions? N N  Walking or climbing stairs? Y Y  Dressing or bathing? N N  Doing errands, shopping? 08/31/2018  Preparing Food and eating ? N -  Using the Toilet? N -  In the past  six months, have you accidently leaked urine? Y -  Do you have problems with loss of bowel control? N -  Managing your Medications? N -  Managing your Finances? Y -  Housekeeping or managing your Housekeeping? Y -  Some recent data might be hidden    Fall/Depression Screening:    Fall Risk  06/30/2018  Falls in the past year? No  Risk for fall due to : Medication side effect;Impaired balance/gait  Risk for fall due to: Comment uses walker   PHQ 2/9 Scores 06/30/2018 06/16/2018  PHQ - 2 Score 1 1    Assessment:  RN CM observed medications and reviewed with pt, pt cannot find nitroglycerin, RN CM faxed barrier letter and today's visit note to primary MD Dr. 08/31/2018, reported BP 100/54, pt asymptomatic, no dizziness, reported pain not managed well, ask if pt can take over the counter pain medications such as ibuprofen, tylenol, ask that new prescription for nitroglycerin be sent to pharmacy.  Pt does not feel going to specialist or outpatient therapy will be helpful as she has tried this before.  Plan: see pt for home visit next month  06/18/2018 [facility], BSN [facility] Care Coordinator [phone]

## 2018-07-06 ENCOUNTER — Other Ambulatory Visit: Payer: Self-pay | Admitting: *Deleted

## 2018-07-06 NOTE — Patient Outreach (Signed)
Telephone call to pt to follow up on pain issues and taking OTC medications, no answer to telephone, left voicemail requesting return phone call. RN CM mailed unsuccessful outreach letter to pt home.  PLAN Outreach pt 3-4 business days  Irving Shows [facility], BSN [facility] Care Coordinator [phone]

## 2018-07-11 ENCOUNTER — Other Ambulatory Visit: Payer: Self-pay | Admitting: *Deleted

## 2018-07-11 NOTE — Patient Outreach (Signed)
Telephone call to patient for follow up on pain management, spoke with pt, HIPAA verified, pt reports she is unloading groceries and this is not a good time to talk, she does not think MD office has tried to reach her and states she is going to call MD today and will call RN CM back.   Irving Shows [facility], BSN [facility] Community Care Coordinator [phone]

## 2018-07-13 ENCOUNTER — Telehealth: Payer: Self-pay | Admitting: Internal Medicine

## 2018-07-13 NOTE — Telephone Encounter (Signed)
Pt says for the last 2 weeks unstable gait/dizziness denies chest pain or SOB and was concerned may be related to her loop recorder - will forward to device

## 2018-07-13 NOTE — Telephone Encounter (Signed)
Pt aware and says she is concerned that Dr Johney Frame told her her loop record needs taking out or changing in 3 years and says it has been 3 years on the 7/21 and is concerned that her symptoms are from her device

## 2018-07-13 NOTE — Telephone Encounter (Signed)
Normal device check, we will conitnue to monitor  [name] Ferry MD

## 2018-07-13 NOTE — Telephone Encounter (Signed)
Most recent transmission (07/11/18) reviewed. No episodes (symptom, brady, pause, tachy or AF) recorded since implantation 06/27/2015. Presenting rhythm SR ~60bpm.

## 2018-07-13 NOTE — Telephone Encounter (Signed)
LINQ loop recorder is a non-therapeutic device, it will not cause symptoms of unsteady gait or dizziness.  LINQ implanted 7/8/6, battery status: OK, not yet RRT. I did my best to explain to [name] that the device can remain in place indefinitely and she can avoid a procedure or she can discuss removal with [name]. She is very concerned that the device will "rot" inside of her and she goes on to explain that her family has abandoned her in [place] where she is very unhappy. I scheduled her to discuss this with [name] 07/21/18 in [place].

## 2018-07-13 NOTE — Telephone Encounter (Signed)
patient wants to discuss his having trouble walking and standing.  Asking about getting device changed out

## 2018-07-16 DIAGNOSIS — R1030 Lower abdominal pain, unspecified: Secondary | ICD-10-CM | POA: Diagnosis not present

## 2018-07-16 DIAGNOSIS — I509 Heart failure, unspecified: Secondary | ICD-10-CM | POA: Diagnosis not present

## 2018-07-16 DIAGNOSIS — N3000 Acute cystitis without hematuria: Secondary | ICD-10-CM | POA: Diagnosis not present

## 2018-07-16 DIAGNOSIS — F419 Anxiety disorder, unspecified: Secondary | ICD-10-CM | POA: Diagnosis not present

## 2018-07-16 DIAGNOSIS — Z79899 Other long term (current) drug therapy: Secondary | ICD-10-CM | POA: Diagnosis not present

## 2018-07-16 DIAGNOSIS — I110 Hypertensive heart disease with heart failure: Secondary | ICD-10-CM | POA: Diagnosis not present

## 2018-07-16 DIAGNOSIS — E119 Type 2 diabetes mellitus without complications: Secondary | ICD-10-CM | POA: Diagnosis not present

## 2018-07-16 DIAGNOSIS — K219 Gastro-esophageal reflux disease without esophagitis: Secondary | ICD-10-CM | POA: Diagnosis not present

## 2018-07-16 DIAGNOSIS — M25551 Pain in right hip: Secondary | ICD-10-CM | POA: Diagnosis not present

## 2018-07-16 DIAGNOSIS — Z794 Long term (current) use of insulin: Secondary | ICD-10-CM | POA: Diagnosis not present

## 2018-07-16 DIAGNOSIS — N390 Urinary tract infection, site not specified: Secondary | ICD-10-CM | POA: Diagnosis not present

## 2018-07-16 DIAGNOSIS — E039 Hypothyroidism, unspecified: Secondary | ICD-10-CM | POA: Diagnosis not present

## 2018-07-16 DIAGNOSIS — I4891 Unspecified atrial fibrillation: Secondary | ICD-10-CM | POA: Diagnosis not present

## 2018-07-16 DIAGNOSIS — Z87891 Personal history of nicotine dependence: Secondary | ICD-10-CM | POA: Diagnosis not present

## 2018-07-17 ENCOUNTER — Other Ambulatory Visit: Payer: Self-pay | Admitting: *Deleted

## 2018-07-17 NOTE — Patient Outreach (Signed)
Care coordination related to pain management follow up with pt, spoke with pt, HIPAA verified, pt reports primary care MD instructed that she can take tylenol over the counter for pain.  Pt reports she went to ED yesterday at [facility] in [place] for pulled muscle and also diagnosed with UTI, pt reports she is on new medication tramadol/ acetaminophen 37.5/ 325 for pain with pulled muscle, also on prednisone, pt reports she will discuss any new medication at home visit next week, pt states she has all prescribed medication and taking as ordered, RN CM talked instructed pt that prednisone can cause elevation in blood sugar readings.  PLAN See pt for home visit next week.  Irving Shows [facility], BSN [facility] Community Care Coordinator [phone]

## 2018-07-21 ENCOUNTER — Encounter: Payer: Self-pay | Admitting: Internal Medicine

## 2018-07-21 ENCOUNTER — Ambulatory Visit (INDEPENDENT_AMBULATORY_CARE_PROVIDER_SITE_OTHER): Payer: PPO | Admitting: Internal Medicine

## 2018-07-21 VITALS — BP 131/60 | HR 75 | Ht 62.0 in | Wt 131.2 lb

## 2018-07-21 DIAGNOSIS — R55 Syncope and collapse: Secondary | ICD-10-CM | POA: Diagnosis not present

## 2018-07-21 DIAGNOSIS — Z952 Presence of prosthetic heart valve: Secondary | ICD-10-CM | POA: Diagnosis not present

## 2018-07-21 DIAGNOSIS — I480 Paroxysmal atrial fibrillation: Secondary | ICD-10-CM | POA: Diagnosis not present

## 2018-07-21 NOTE — Progress Notes (Signed)
PCP: Toma Deiters, MD Primary Cardiologist: Dr Purvis Sheffield Primary EP:  Dr Richardean Canal is a 84 y.o. female who presents today for routine electrophysiology followup.  Since last being seen in our clinic, the patient reports doing reasonably well.  She reports URI symptoms and R hip pain.  No CV concerns. Today, she denies symptoms of palpitations, chest pain, shortness of breath,  lower extremity edema, dizziness, presyncope, or syncope.  The patient is otherwise without complaint today.   Past Medical History:  Diagnosis Date  . Arrhythmia    Sinus tachycardia  . Atrial fibrillation (HCC)    status post Cox-Maze procedure maintaining normal sinus rhythm, amiodarone and Coumadin discontinued.  . CHF (congestive heart failure) (HCC)    normal LV systolic function  . COPD (chronic obstructive pulmonary disease) (HCC)   . Degenerative lumbar spinal stenosis    Severe degenerative disk disease of lumbosacral spine  . Diabetes (HCC)   . GERD (gastroesophageal reflux disease)   . Hypothyroidism   . PFO (patent foramen ovale)    status post PFO closure at time of surgery  . Rheumatic heart disease    s/p right thoracotomy and mitral valve replacement with Medtronic Mosaic porcine tissue valve  . Syncope    recurrent unexplained syncope   Past Surgical History:  Procedure Laterality Date  . EP IMPLANTABLE DEVICE N/A 06/27/2015   MDT LINQ ILR implanted by Dr Johney Frame for unexplained syncope  . EXPLORATORY LAPAROTOMY    . FOOT SURGERY     left foot  . MITRAL VALVE REPLACEMENT    . PERCUTANEOUS BALLOON VALVULOPLASTY  1993   Mitral valve    ROS- all systems are reviewed and negative except as per HPI above  Current Outpatient Medications  Medication Sig Dispense Refill  . calcium-vitamin D (OSCAL WITH D) 500-200 MG-UNIT tablet Take 1 tablet by mouth 2 (two) times daily.    . cephALEXin (KEFLEX) 250 MG capsule Take 250 mg by mouth 2 (two) times daily.  0  .  Cholecalciferol (VITAMIN D3) 2000 UNITS TABS Take 1 tablet by mouth 2 (two) times daily.     Marland Kitchen CRANBERRY FRUIT CONCENTRATE PO Take 1 capsule by mouth 2 (two) times daily.     . diphenoxylate-atropine (LOMOTIL) 2.5-0.025 MG per tablet Take 1 tablet by mouth 4 (four) times daily as needed for diarrhea or loose stools.     . hydroxypropyl methylcellulose / hypromellose (ISOPTO TEARS / GONIOVISC) 2.5 % ophthalmic solution Place 1 drop into both eyes as needed for dry eyes.    . insulin NPH (HUMULIN N) 100 UNIT/ML injection Inject 40 Units into the skin daily.     Marland Kitchen levothyroxine (SYNTHROID, LEVOTHROID) 100 MCG tablet Take 100 mcg by mouth daily before breakfast.     . LORazepam (ATIVAN) 0.5 MG tablet Take 0.5 mg by mouth at bedtime as needed for anxiety or sleep.     . metoprolol succinate (TOPROL-XL) 25 MG 24 hr tablet Take 1 tablet (25 mg total) by mouth daily. 90 tablet 3  . Multiple Vitamin (MULTIVITAMIN) tablet Take 1 tablet by mouth daily.      . nitroGLYCERIN (NITROSTAT) 0.4 MG SL tablet Place 0.4 mg under the tongue every 5 (five) minutes x 3 doses as needed for chest pain.    . Omega-3 Fatty Acids (FISH OIL) 1200 MG CAPS Take 1 capsule by mouth 2 (two) times daily.     . pantoprazole (PROTONIX) 40 MG tablet Take 1  tablet (40 mg total) by mouth daily. 30 tablet 0  . potassium chloride (KLOR-CON) 10 MEQ CR tablet Take 10 mEq by mouth daily.      . ranitidine (ZANTAC) 300 MG capsule Take 300 mg by mouth 2 (two) times daily.      . traMADol-acetaminophen (ULTRACET) 37.5-325 MG tablet Take 1 tablet by mouth every 8 (eight) hours as needed. for pain  0  . methylPREDNISolone (MEDROL DOSEPAK) 4 MG TBPK tablet Take 1 tablet by mouth See admin instructions. see package  0   No current facility-administered medications for this visit.     Physical Exam: Vitals:   07/21/18 1031  BP: 131/60  Pulse: 75  SpO2: 97%  Weight: 131 lb 3.2 oz (59.5 kg)  Height: 5\' 2"  (1.575 m)    GEN- The patient is  well appearing, alert and oriented x 3 today.   Head- normocephalic, atraumatic Eyes-  Sclera clear, conjunctiva pink Ears- hearing intact Oropharynx- clear Lungs- Clear to ausculation bilaterally, normal work of breathing Chest- ILR site is well healed Heart- Regular rate and rhythm, no murmurs, rubs or gallops, PMI not laterally displaced GI- soft, NT, ND, + BS Extremities- no clubbing, cyanosis, or edema  ILR interrogation- reviewed in detail today,  See PACEART report  ekg 06/02/18 is personally reviewed today  Assessment and Plan:  1. Syncope No episodes in 3 years ILR reveals no arrhythmias Approaching ERI.  I am not convinced that she will need a new device then though she seems very worried that she should have a new ILR.  I have explained that the device in no way prevents syncope.  I have tried to reassure her with the fact that she has not passed out in 3 years.  2. afib No afib on ILR since implant Poor candidate for anticoagulation long term  3. Rheumatic heart disease with bioprosthetic MV (05/23/09) with PFO coluse and Cox-Maze Followed by Dr 07/23/09  4. HTN Stable No change required today  carelink Return as needed   [name] Jasmine MD, [facility] 07/21/2018 11:39 AM

## 2018-07-21 NOTE — Patient Instructions (Signed)
Medication Instructions:  Continue all current medications.  Labwork: none  Testing/Procedures: none  Follow-Up: As needed   Any Other Special Instructions Will Be Listed Below (If Applicable). Automatic remote checks.   If you need a refill on your cardiac medications before your next appointment, please call your pharmacy.

## 2018-07-27 ENCOUNTER — Encounter: Payer: Self-pay | Admitting: *Deleted

## 2018-07-27 ENCOUNTER — Other Ambulatory Visit: Payer: Self-pay | Admitting: *Deleted

## 2018-07-27 NOTE — Patient Outreach (Addendum)
[facility]) Care Management   07/27/2018  [name] August 20, 1925 [id number]  [name] is an 84 y.o. female  Subjective: Routine home visit with pt, HIPAA verified, pt reports her pulled muscle in right hip " is so much better", pt states she still has pain in legs and feet (chronic) and has tramadol and tylenol but does not take everyday.  Pt reports she has appointment with [facility] 07/31/18 for device check in [place] but was told "not to come there, I'm confused about where to go"  Pt reports no falls.  Reports CBG "good, today was 125".  Objective:   Vitals:   07/27/18 1104  BP: (!) 106/58  Pulse: 67  Resp: 18  SpO2: 98%   ROS  Physical Exam  Constitutional: She is oriented to person, place, and time. She appears well-developed and well-nourished.  HENT:  Head: Normocephalic.  Neck: Normal range of motion. Neck supple.  Cardiovascular: Normal rate.  Respiratory: Effort normal and breath sounds normal.  GI: Soft. Bowel sounds are normal.  Musculoskeletal:  Pt wearing full stockings (hose) and did not remove  Neurological: She is alert and oriented to person, place, and time.  Skin: Skin is warm and dry.  Psychiatric: She has a normal mood and affect. Her behavior is normal. Judgment and thought content normal.  Pt forgetful at times    Encounter Medications:   Outpatient Encounter Medications as of 07/27/2018  Medication Sig  . acetaminophen (TYLENOL) 500 MG tablet Take 500 mg by mouth every 6 (six) hours as needed.  . calcium-vitamin D (OSCAL WITH D) 500-200 MG-UNIT tablet Take 1 tablet by mouth 2 (two) times daily.  . Cholecalciferol (VITAMIN D3) 2000 UNITS TABS Take 1 tablet by mouth 2 (two) times daily.   Marland Kitchen CRANBERRY FRUIT CONCENTRATE PO Take 1 capsule by mouth 2 (two) times daily.   . diphenoxylate-atropine (LOMOTIL) 2.5-0.025 MG per tablet Take 1 tablet by mouth 4 (four) times daily as needed for diarrhea or loose stools.   .  hydroxypropyl methylcellulose / hypromellose (ISOPTO TEARS / GONIOVISC) 2.5 % ophthalmic solution Place 1 drop into both eyes as needed for dry eyes.  . insulin NPH (HUMULIN N) 100 UNIT/ML injection Inject 40 Units into the skin daily.   Marland Kitchen levothyroxine (SYNTHROID, LEVOTHROID) 100 MCG tablet Take 100 mcg by mouth daily before breakfast.   . LORazepam (ATIVAN) 0.5 MG tablet Take 0.5 mg by mouth at bedtime as needed for anxiety or sleep.   . metoprolol succinate (TOPROL-XL) 25 MG 24 hr tablet Take 1 tablet (25 mg total) by mouth daily.  . Multiple Vitamin (MULTIVITAMIN) tablet Take 1 tablet by mouth daily.    . nitroGLYCERIN (NITROSTAT) 0.4 MG SL tablet Place 0.4 mg under the tongue every 5 (five) minutes x 3 doses as needed for chest pain.  . Omega-3 Fatty Acids (FISH OIL) 1200 MG CAPS Take 1 capsule by mouth 2 (two) times daily.   . potassium chloride (KLOR-CON) 10 MEQ CR tablet Take 10 mEq by mouth daily.    . ranitidine (ZANTAC) 300 MG capsule Take 300 mg by mouth 2 (two) times daily.    . methylPREDNISolone (MEDROL DOSEPAK) 4 MG TBPK tablet Take 1 tablet by mouth See admin instructions. see package  . pantoprazole (PROTONIX) 40 MG tablet Take 1 tablet (40 mg total) by mouth daily.  . traMADol-acetaminophen (ULTRACET) 37.5-325 MG tablet Take 1 tablet by mouth every 8 (eight) hours as needed. for pain   No  facility-administered encounter medications on file as of 07/27/2018.     Functional Status:   In your present state of health, do you have any difficulty performing the following activities: 06/30/2018 06/16/2018  Hearing? N N  Vision? N N  Difficulty concentrating or making decisions? N N  Walking or climbing stairs? Y Y  Dressing or bathing? N N  Doing errands, shopping? Tempie Donning  Preparing Food and eating ? N -  Using the Toilet? N -  In the past six months, have you accidently leaked urine? Y -  Do you have problems with loss of bowel control? N -  Managing your Medications? N -   Managing your Finances? Y -  Housekeeping or managing your Housekeeping? Y -  Some recent data might be hidden    Fall/Depression Screening:    Fall Risk  06/30/2018  Falls in the past year? No  Risk for fall due to : Medication side effect;Impaired balance/gait  Risk for fall due to: Comment uses walker   PHQ 2/9 Scores 06/30/2018 06/16/2018  PHQ - 2 Score 1 1    Assessment:  RN CM reviewed medications, pt saw Dr. Rayann Heman last week, RN CM called [place] (cardiology) and spoke with Janett Billow who confirms pt does not come in for appointment on 07/31/18, device will be checked while patient sleeps, pt needs to do nothing differently, pt verbalizes understanding.  RN CM discussed plan of care/ discharge plan with pt and pt agreeable to telephonic assessment next month and prefers this RN CM call her so pt will not get confused.  THN CM Care Plan Problem One     Most Recent Value  Care Plan Problem One  Chronic pain  Role Documenting the Problem One  Care Management Coordinator  Care Plan for Problem One  Active  THN Long Term Goal   Pt will verbalize decrease in pain and show improvement with managing pain within 60 days  THN Long Term Goal Start Date  06/30/18  Interventions for Problem One Long Term Goal  RN CM reviewed medications with pt, she has tramadol (but hasn't taken in last few days) and tylenol, RN CM reviewed importance of taking as prescribed.  THN CM Short Term Goal #1   pt will use pain management strategies to alleviate pain witin 30 days  THN CM Short Term Goal #1 Start Date  07/27/18 Barrie Folk restarted for reinforcement]  Interventions for Short Term Goal #1  Pt is not taking pain medication every day (even when she has pain rating of 8), RN CM talked with pt about importance of adequate pain relief and keeping pain under better control and that it is ok to take the prescribed tylenol and tramadol.,  Reviewed relaxation, ask pt to continue to be as mobile as possible.    THN CM Care  Plan Problem Two     Most Recent Value  Care Plan Problem Two  High risk falls  Role Documenting the Problem Two  Care Management Coordinator  Care Plan for Problem Two  Active  THN CM Short Term Goal #1   pt will verbalize safety precautions within 30 days  THN CM Short Term Goal #1 Start Date  06/30/18  [facility] CM Short Term Goal #1 Met Date   07/27/18  Interventions for Short Term Goal #2   RN CM reviewed safety precautions, importance of always wearing LIfe Alert necklace, using assistive device, asking for assistance as needed      Plan:  Outreach  pt for telephonic assessment next month Assess pain Reinforce safety  Jacqlyn Larsen [facility], [place] Coordinator [phone]

## 2018-07-28 ENCOUNTER — Telehealth: Payer: Self-pay | Admitting: Internal Medicine

## 2018-07-28 NOTE — Telephone Encounter (Signed)
Patient called crying and upset.   Stated that she has no idea what is she is suppose to do about remote.

## 2018-07-28 NOTE — Telephone Encounter (Signed)
Spoke with patient. She is upset because she thinks she missed an appointment. Advised that I don't see any missed appointments and that her Carelink monitor is updating automatically. Encouraged her to keep monitor plugged in. Advised that her next in-office appointment is with Dr. Purvis Sheffield in [place] on 08/29/18 at 8:20am. Patient verbalizes understanding and appreciation. She denies additional questions or concerns at this time.

## 2018-07-29 LAB — CUP PACEART REMOTE DEVICE CHECK
Date Time Interrogation Session: 20190709023959
Implantable Pulse Generator Implant Date: 20160708

## 2018-07-31 ENCOUNTER — Ambulatory Visit (INDEPENDENT_AMBULATORY_CARE_PROVIDER_SITE_OTHER): Payer: PPO | Admitting: *Deleted

## 2018-07-31 DIAGNOSIS — R55 Syncope and collapse: Secondary | ICD-10-CM | POA: Diagnosis not present

## 2018-07-31 NOTE — Progress Notes (Signed)
Carelink Summary Report / Loop Recorder 

## 2018-08-13 LAB — CUP PACEART INCLINIC DEVICE CHECK
Date Time Interrogation Session: 20190802131849
MDC IDC PG IMPLANT DT: 20160708

## 2018-08-28 ENCOUNTER — Ambulatory Visit: Payer: Self-pay | Admitting: *Deleted

## 2018-08-29 ENCOUNTER — Telehealth: Payer: Self-pay | Admitting: *Deleted

## 2018-08-29 ENCOUNTER — Other Ambulatory Visit: Payer: Self-pay | Admitting: *Deleted

## 2018-08-29 ENCOUNTER — Ambulatory Visit: Payer: PPO | Admitting: Cardiovascular Disease

## 2018-08-29 ENCOUNTER — Encounter: Payer: Self-pay | Admitting: Cardiovascular Disease

## 2018-08-29 VITALS — BP 112/66 | HR 67 | Ht 62.0 in | Wt 132.6 lb

## 2018-08-29 DIAGNOSIS — I099 Rheumatic heart disease, unspecified: Secondary | ICD-10-CM

## 2018-08-29 DIAGNOSIS — Z952 Presence of prosthetic heart valve: Secondary | ICD-10-CM | POA: Diagnosis not present

## 2018-08-29 DIAGNOSIS — R55 Syncope and collapse: Secondary | ICD-10-CM | POA: Diagnosis not present

## 2018-08-29 DIAGNOSIS — R252 Cramp and spasm: Secondary | ICD-10-CM | POA: Diagnosis not present

## 2018-08-29 DIAGNOSIS — I5032 Chronic diastolic (congestive) heart failure: Secondary | ICD-10-CM | POA: Diagnosis not present

## 2018-08-29 DIAGNOSIS — I110 Hypertensive heart disease with heart failure: Secondary | ICD-10-CM | POA: Diagnosis not present

## 2018-08-29 DIAGNOSIS — I10 Essential (primary) hypertension: Secondary | ICD-10-CM

## 2018-08-29 DIAGNOSIS — I480 Paroxysmal atrial fibrillation: Secondary | ICD-10-CM

## 2018-08-29 NOTE — Patient Outreach (Addendum)
[facility]) Care Management  08/29/2018  [name] 01-01-1925 [id number]    Covering Irving Shows, RN  RN spoke with pt today and verified identifiers and the purpose for today's call. RN indicated the purpose for today's call and offered to further engage with updating her plan of care and events that may have occurred since the last visit with the [facility] RN Case Manager. Pt states she almost had a falls but was supportive by two people standing by and did not hit the ground. States no inquiries occurred. Pt also states she continues to have pain and recently had lab-work to check on her potassium levels that might be low. Pt states the cramps in her legs were getting worse so she visited her provider and will follow up in 2 weeks. Plan of care reviewed and discussed as pt continue to manager her pain with the prescribed and OTC medications but feels that is not working as well. Pt report she will be following up and will possible have a change in her medication regimen that may be better. Pt verbalized an understanding and will continue to control her pain with the available plan there providers her discussed.   Pt states she has a loop record if any issues arise concerning his HF. States this is under control at this time.  Pt states everyone is getting out of the building due to the alarm system was going off. RN offered to call back at a later day to further engage if needed after her follow up appointment. Will scheduled call in few weeks for update.  Elliot Cousin, RN Care Management Coordinator [facility] Main Office [phone]

## 2018-08-29 NOTE — Addendum Note (Signed)
Addended by: Lesle Chris on: 08/29/2018 08:43 AM   Modules accepted: Orders

## 2018-08-29 NOTE — Patient Instructions (Signed)
Medication Instructions:  Continue all current medications.  Labwork:  BMET, Magnesium - orders given today.   Office will contact with results via phone or letter.    Testing/Procedures: none  Follow-Up: Your physician wants you to follow up in:  1 year.  You will receive a reminder letter in the mail one-two months in advance.  If you don't receive a letter, please call our office to schedule the follow up appointment   Any Other Special Instructions Will Be Listed Below (If Applicable).  If you need a refill on your cardiac medications before your next appointment, please call your pharmacy.

## 2018-08-29 NOTE — Progress Notes (Signed)
SUBJECTIVE: The patient presents for routine follow-up.  She has a history of syncope but has had no recurrences in years.  Her most recent implantable loop recorder interrogation did not demonstrate any evidence of bradycardia, pauses, tachycardia, or atrial fibrillation since implantation in July 2016. She has a bioprosthetic Medtronic mitral valve (implanted on 05-23-2009), s/p PFO closure, atrial fibrillation s/p Cox-Maze procedure, chronic diastolic heart failure, COPD, and hypothryoidism.   She denies chest pain and shortness of breath.  She had a near fall last week but to bank attendance caught her.  She had lost her balance.  She denies any associated dizziness.  She has been struggling with left leg cramps for the past 6 months.  Her PCP reportedly suggested something over-the-counter which "helps a little ".  She said she gets blood work at her PCPs office every 3 months and is scheduled to see him next month.     Social history: She had 4 sons, one of whom is still living. One son died in [place], another son in [place], and another died of an MI. He was 36. She told me her father died young of an MI as well as did a brother.  Review of Systems: As per "subjective", otherwise negative.  Allergies  Allergen Reactions  . Sulfonamide Derivatives Anaphylaxis, Shortness Of Breath and Swelling  . Aspirin     Uncoated Aspirin = stomach upset/pain EC Aspirin is ok    Current Outpatient Medications  Medication Sig Dispense Refill  . acetaminophen (TYLENOL) 500 MG tablet Take 500 mg by mouth every 6 (six) hours as needed.    . calcium-vitamin D (OSCAL WITH D) 500-200 MG-UNIT tablet Take 1 tablet by mouth 2 (two) times daily.    . Cholecalciferol (VITAMIN D3) 2000 UNITS TABS Take 1 tablet by mouth 2 (two) times daily.     Marland Kitchen CRANBERRY FRUIT CONCENTRATE PO Take 1 capsule by mouth 2 (two) times daily.     . hydroxypropyl methylcellulose / hypromellose (ISOPTO TEARS / GONIOVISC)  2.5 % ophthalmic solution Place 1 drop into both eyes as needed for dry eyes.    . insulin NPH (HUMULIN N) 100 UNIT/ML injection Inject 40 Units into the skin daily.     Marland Kitchen levothyroxine (SYNTHROID, LEVOTHROID) 100 MCG tablet Take 100 mcg by mouth daily before breakfast.     . LORazepam (ATIVAN) 0.5 MG tablet Take 0.5 mg by mouth at bedtime as needed for anxiety or sleep.     . metoprolol succinate (TOPROL-XL) 25 MG 24 hr tablet Take 1 tablet (25 mg total) by mouth daily. 90 tablet 3  . Multiple Vitamin (MULTIVITAMIN) tablet Take 1 tablet by mouth daily.      . nitroGLYCERIN (NITROSTAT) 0.4 MG SL tablet Place 0.4 mg under the tongue every 5 (five) minutes x 3 doses as needed for chest pain.    . Omega-3 Fatty Acids (FISH OIL) 1200 MG CAPS Take 1 capsule by mouth 2 (two) times daily.     . potassium chloride (KLOR-CON) 10 MEQ CR tablet Take 10 mEq by mouth daily.      . ranitidine (ZANTAC) 300 MG capsule Take 300 mg by mouth 2 (two) times daily.      . traMADol-acetaminophen (ULTRACET) 37.5-325 MG tablet Take 1 tablet by mouth every 8 (eight) hours as needed. for pain  0   No current facility-administered medications for this visit.     Past Medical History:  Diagnosis Date  . Arrhythmia  Sinus tachycardia  . Atrial fibrillation (HCC)    status post Cox-Maze procedure maintaining normal sinus rhythm, amiodarone and Coumadin discontinued.  . CHF (congestive heart failure) (HCC)    normal LV systolic function  . COPD (chronic obstructive pulmonary disease) (HCC)   . Degenerative lumbar spinal stenosis    Severe degenerative disk disease of lumbosacral spine  . Diabetes (HCC)   . GERD (gastroesophageal reflux disease)   . Hypothyroidism   . PFO (patent foramen ovale)    status post PFO closure at time of surgery  . Rheumatic heart disease    s/p right thoracotomy and mitral valve replacement with Medtronic Mosaic porcine tissue valve  . Syncope    recurrent unexplained syncope     Past Surgical History:  Procedure Laterality Date  . EP IMPLANTABLE DEVICE N/A 06/27/2015   MDT LINQ ILR implanted by Dr Johney Frame for unexplained syncope  . EXPLORATORY LAPAROTOMY    . FOOT SURGERY     left foot  . MITRAL VALVE REPLACEMENT    . PERCUTANEOUS BALLOON VALVULOPLASTY  1993   Mitral valve    Social History   Socioeconomic History  . Marital status: Widowed    Spouse name: Not on file  . Number of children: Not on file  . Years of education: Not on file  . Highest education level: Not on file  Occupational History  . Not on file  Social Needs  . Financial resource strain: Not on file  . Food insecurity:    Worry: Not on file    Inability: Not on file  . Transportation needs:    Medical: Not on file    Non-medical: Not on file  Tobacco Use  . Smoking status: Former Smoker    Packs/day: 0.30    Years: 18.00    Pack years: 5.40    Types: Cigarettes    Start date: 05/26/1977    Last attempt to quit: 12/20/1988    Years since quitting: 29.7  . Smokeless tobacco: Never Used  Substance and Sexual Activity  . Alcohol use: No    Alcohol/week: 0.0 standard drinks  . Drug use: No  . Sexual activity: Not on file  Lifestyle  . Physical activity:    Days per week: Not on file    Minutes per session: Not on file  . Stress: Not on file  Relationships  . Social connections:    Talks on phone: Not on file    Gets together: Not on file    Attends religious service: Not on file    Active member of club or organization: Not on file    Attends meetings of clubs or organizations: Not on file    Relationship status: Not on file  . Intimate partner violence:    Fear of current or ex partner: Not on file    Emotionally abused: Not on file    Physically abused: Not on file    Forced sexual activity: Not on file  Other Topics Concern  . Not on file  Social History Narrative   Lives in [place]     Vitals:   08/29/18 0807  BP: 112/66  Pulse: 67  Weight: 132 lb 9.6 oz  (60.1 kg)  Height: 5\' 2"  (1.575 m)    Wt Readings from Last 3 Encounters:  08/29/18 132 lb 9.6 oz (60.1 kg)  07/21/18 131 lb 3.2 oz (59.5 kg)  06/30/18 127 lb (57.6 kg)     PHYSICAL EXAM General: NAD HEENT: Normal.  Neck: No JVD, no thyromegaly. Lungs: Clear to auscultation bilaterally with normal respiratory effort. CV: Regular rate and rhythm, normal S1/S2, no S3/S4, no murmur. No pretibial or periankle edema.  No carotid bruit.   Abdomen: Soft, nontender, no distention.  Neurologic: Alert and oriented.  Psych: Normal affect. Skin: Normal. Musculoskeletal: No gross deformities.    ECG: Reviewed above under Subjective   Labs:    Lipids: Lab Results  Component Value Date/Time   [facility]  01/03/2008 05:35 AM    46        Total Cholesterol/HDL:CHD Risk Coronary Heart Disease Risk Table                     Men   Women  1/2 Average Risk   3.4   3.3   CHOL  01/03/2008 05:35 AM    139        ATP III CLASSIFICATION:  <200     mg/dL   Desirable  465-035  mg/dL   Borderline High  >=465    mg/dL   High   TRIG 681 (H) 27/51/7001 05:35 AM   HDL 26 (L) 01/03/2008 05:35 AM       ASSESSMENT AND PLAN: 1. Syncope: Symptomatically stable. No recurrences. No sinus pauses or arrhythmias with loop recorder thus far. Prior episodes may have been hypoglycemic in nature.  2. Rheumatic mitral disease s/p bioprosthetic valve replacement: Symptomatically stable. Most recent echocardiogram from 03/2015 showed normal function. Continue to monitor.   3. Atrial fibrillation s/p Cox-Maze: Remains in sinusrhythm. No changes.  4. Chronic diastolic heart failure: Euvolemic.  Blood pressure is normal.  5. Hypertension: Blood pressure is normal.  No changes.  6.  Left leg cramps: I will obtain a basic metabolic panel and magnesium level to see if there are any electrolyte abnormalities which could be contributing to her symptoms.   Disposition: Follow up 1 yr   Prentice Docker,  M.D., F.A.C.C.

## 2018-08-29 NOTE — Telephone Encounter (Signed)
Notes recorded by Lesle Chris, LPN on 7/82/9562 at 4:40 PM EDT Patient notified. Copy to pmd. ------  Notes recorded by Lesle Chris, LPN on 01/19/8656 at 1:49 PM EDT Left message to return call.  ------  Notes recorded by [name], MD on 08/29/2018 at 1:19 PM EDT Potassium and magnesium levels are normal. TSH is mildly elevated. Please forward a copy to PCP.

## 2018-09-01 ENCOUNTER — Ambulatory Visit (INDEPENDENT_AMBULATORY_CARE_PROVIDER_SITE_OTHER): Payer: PPO | Admitting: *Deleted

## 2018-09-01 DIAGNOSIS — R55 Syncope and collapse: Secondary | ICD-10-CM | POA: Diagnosis not present

## 2018-09-01 NOTE — Progress Notes (Signed)
Carelink Summary Report / Loop Recorder 

## 2018-09-06 LAB — CUP PACEART REMOTE DEVICE CHECK
Date Time Interrogation Session: 20190811064134
MDC IDC PG IMPLANT DT: 20160708

## 2018-09-12 ENCOUNTER — Encounter: Payer: Self-pay | Admitting: *Deleted

## 2018-09-12 NOTE — Telephone Encounter (Signed)
This encounter was created in error - please disregard.

## 2018-09-16 LAB — CUP PACEART REMOTE DEVICE CHECK
Date Time Interrogation Session: 20190913104229
Implantable Pulse Generator Implant Date: 20160708

## 2018-09-18 ENCOUNTER — Other Ambulatory Visit: Payer: Self-pay | Admitting: *Deleted

## 2018-09-18 NOTE — Patient Outreach (Addendum)
[facility]) Care Management  09/18/2018  [name] 22-Jun-1925 [id number]    Telephone Assessment  RN spoke with pt today and inquired on her ongoing management of care. Pt continue to complain of her ongoing pain (history). Pt states she has legs pain and her providers do not wish to operate. Pt states she is having along of constipation and takes MOM for relief however lots of leg pain that again her provider are aware of this ongoing symptoms. No signs or issues presented related to pt's HF as pt continue to wear the loop record for remote calls scheduled to check the device.   Labs indicates pt's lab work for potassium and magnesium were within normal limits. RN discussed other OTC medications that maybe able to assist and encouraged pt to take several days weekly until regulated then wean accordingly. Pt will try to regimen and if no response contact her provider for additional interventions.   Pt requested assistance with meals due to her limited ability to stand in the kitchen to prepare her meals. RN offered to make a referral for [facility] social work for possible meal assistance (pt receptive).  RN will discuss pt's plan of care incorporating Social Work consult, GI issues along with her pain management and re-evaluate next month. Will provider RN contact number and follow up in few weeks concerning pt's progress. Interventions generated and adjusted accordingly based upon pt's progress.  Will schedule a follow up call in October.  Elliot Cousin, RN Care Management Coordinator [facility] Main Office [phone]

## 2018-09-22 ENCOUNTER — Other Ambulatory Visit: Payer: Self-pay

## 2018-09-22 NOTE — Patient Outreach (Addendum)
[facility]) Care Management  09/22/2018  [name] [birth year] [id number]   Initial outreach to patient regarding social work referral for meal program resources.  BSW left voicemail message.  Unsuccessful outreach letter mailed.  BSW will attempt to reach again within four business days.  Addendum: BSW received return call from [name].  She reported that she has difficulty with meal preparation because she cannot stand for long periods of time. BSW informed her of contract with [employer] where she can receive 30 days of meal delivery right away and also be placed on the wait list for continued delivery.  [name] was hesitant about this service because she has received meals before and said they were not good.  BSW encouraged her to try the meals again and informed her that the service can be cancelled if she is not pleased.  [name] agreed to this.  BSW talked with her about Companion Care Services through [facility], and [employer] but she declined this due to cost.  BSW left voicemail message for [name] regarding [employer] delivery.  BSW will follow up with [name] when a start date for delivery has been confirmed.  Malachy Chamber, BSW Social Worker [phone]

## 2018-09-26 ENCOUNTER — Ambulatory Visit: Payer: Self-pay

## 2018-09-26 ENCOUNTER — Other Ambulatory Visit: Payer: Self-pay

## 2018-09-26 NOTE — Patient Outreach (Signed)
[facility]) Care Management  09/26/2018  [name] December 29, 1924 [id number]   Second call to [facility] Director of Nutrition Services, Merlyn Albert, due to no return call from message left last week. BSW left voicemail message. BSW received return call from [place] who reported that Drenda Freeze is currently out of the office.  BSW provided needed information for meal delivery and Misty agreed to call back with date that delivery can begin.  BSW also requested that patient be added to the wait list for continued delivery after the initial 30 days that is part of contract with [facility].  Misty called back and reported that meal delivery can being tomorrow but address for patient in Epic is invalid.  BSW attempted to contact patient for correct address but had to leave a voicemail message.  BSW will attempt to reach her again tomorrow.    Malachy Chamber, BSW Social Worker [phone]

## 2018-09-27 ENCOUNTER — Other Ambulatory Visit: Payer: Self-pay

## 2018-09-27 DIAGNOSIS — E119 Type 2 diabetes mellitus without complications: Secondary | ICD-10-CM | POA: Diagnosis not present

## 2018-09-27 DIAGNOSIS — I10 Essential (primary) hypertension: Secondary | ICD-10-CM | POA: Diagnosis not present

## 2018-09-27 DIAGNOSIS — Z6825 Body mass index (BMI) 25.0-25.9, adult: Secondary | ICD-10-CM | POA: Diagnosis not present

## 2018-09-27 DIAGNOSIS — M545 Low back pain: Secondary | ICD-10-CM | POA: Diagnosis not present

## 2018-09-27 DIAGNOSIS — I5032 Chronic diastolic (congestive) heart failure: Secondary | ICD-10-CM | POA: Diagnosis not present

## 2018-09-27 DIAGNOSIS — I700 Atherosclerosis of aorta: Secondary | ICD-10-CM | POA: Diagnosis not present

## 2018-09-27 DIAGNOSIS — E038 Other specified hypothyroidism: Secondary | ICD-10-CM | POA: Diagnosis not present

## 2018-09-27 DIAGNOSIS — J449 Chronic obstructive pulmonary disease, unspecified: Secondary | ICD-10-CM | POA: Diagnosis not present

## 2018-09-27 DIAGNOSIS — J441 Chronic obstructive pulmonary disease with (acute) exacerbation: Secondary | ICD-10-CM | POA: Diagnosis not present

## 2018-09-27 DIAGNOSIS — Z0000 Encounter for general adult medical examination without abnormal findings: Secondary | ICD-10-CM | POA: Diagnosis not present

## 2018-09-27 DIAGNOSIS — Z1389 Encounter for screening for other disorder: Secondary | ICD-10-CM | POA: Diagnosis not present

## 2018-09-27 NOTE — Patient Outreach (Signed)
[facility]) Care Management  09/27/2018  [name] 26-Jun-1925 [id number]   BSW received return call from Ms. Pick who confirmed her address.  BSW spoke with [facility] at [facility] and she confirmed that meal delivery will begin on Thursday, 09/28/18.  BSW will follow up with Ms. Vickroy next week to ensure receipt of meals and to confirm that she would like continued delivery.  Malachy Chamber, BSW Social Worker [phone]

## 2018-10-02 ENCOUNTER — Other Ambulatory Visit: Payer: Self-pay

## 2018-10-02 NOTE — Patient Outreach (Signed)
[facility]) Care Management  10/02/2018  CORLEEN OTWELL 1925/08/18 [id number]  BSW received call from Ms. Sweeden regarding [employer].  She reported that she received a meal on Friday but no longer wants to receive meals.  She reported that she does not like the food and "can't eat it."  BSW communicated with Nutrition Director, Merlyn Albert, and requested that meal delivery cease at this time.  BSW is closing case.  Malachy Chamber, BSW Social Worker [phone]

## 2018-10-04 ENCOUNTER — Ambulatory Visit (INDEPENDENT_AMBULATORY_CARE_PROVIDER_SITE_OTHER): Payer: PPO | Admitting: *Deleted

## 2018-10-04 DIAGNOSIS — R55 Syncope and collapse: Secondary | ICD-10-CM

## 2018-10-04 DIAGNOSIS — I5032 Chronic diastolic (congestive) heart failure: Secondary | ICD-10-CM

## 2018-10-04 NOTE — Progress Notes (Signed)
Carelink Summary Report / Loop Recorder 

## 2018-10-16 ENCOUNTER — Other Ambulatory Visit: Payer: Self-pay | Admitting: *Deleted

## 2018-10-16 LAB — CUP PACEART REMOTE DEVICE CHECK
Date Time Interrogation Session: 20191016103820
Implantable Pulse Generator Implant Date: 20160708

## 2018-10-16 NOTE — Patient Outreach (Signed)
[facility]) Care Management  10/16/2018  [name] 09/06/1925 [id number]  Case Closure  RN spoke with pt today with update on her ongoing management of care. Pt reports she continues to have her LOOP RECORDER for her ongoing HF issues but has not had any issues and decided any swelling or HF symptoms. Pt also states no pain today and she continues to manage her pain well at this time. Nutritional pt states she did received assistance from the [facility] social work for meal services and indicates she can go to the kitchen and prepare something if she needs to.Based upon reviewing the plan of care along with all interventions all goals have been met and pt continue to report she is managing all her ongoing medical issues with no issues.   RN discussed discharged from the program based upon her ongoing management of care (pt receptive). Based upon the goals remaining pain management and pt managing this condition fairly well will close this case and notify the provider that pt has met all goals in managing her ongoing care.  Raina Mina, RN Care Management Coordinator [place] Office [phone]

## 2018-10-31 DIAGNOSIS — M810 Age-related osteoporosis without current pathological fracture: Secondary | ICD-10-CM | POA: Diagnosis not present

## 2018-11-06 ENCOUNTER — Ambulatory Visit (INDEPENDENT_AMBULATORY_CARE_PROVIDER_SITE_OTHER): Payer: PPO

## 2018-11-06 DIAGNOSIS — R55 Syncope and collapse: Secondary | ICD-10-CM | POA: Diagnosis not present

## 2018-11-06 NOTE — Progress Notes (Signed)
Carelink Summary Report / Loop Recorder 

## 2018-11-13 ENCOUNTER — Telehealth: Payer: Self-pay | Admitting: Cardiology

## 2018-11-13 NOTE — Telephone Encounter (Signed)
LMOVM requesting that pt send manual transmission b/c home monitor has not updated in at least 14 days.    

## 2018-11-14 NOTE — Telephone Encounter (Signed)
Patient has tried to return call - please call patient back

## 2018-11-14 NOTE — Telephone Encounter (Signed)
Spoke w/ pt and informed her that [employer] tech support will call her in 1-2 business days to help her fix her home monitor. Pt verbalized understanding.

## 2018-11-21 ENCOUNTER — Telehealth: Payer: Self-pay | Admitting: Internal Medicine

## 2018-11-21 NOTE — Telephone Encounter (Signed)
Patient called and was wondering when her loop recorder would be removed. I informed her that once her loop recorder reached RRT we will call and schedule an appointment with her MD to discuss removal. Pt verbalized understanding.

## 2018-11-21 NOTE — Telephone Encounter (Signed)
[name] called the [facility] in regards to her pace maker She states that she is having telephone issues. Patient would like a call back.

## 2018-11-28 DIAGNOSIS — M810 Age-related osteoporosis without current pathological fracture: Secondary | ICD-10-CM | POA: Diagnosis not present

## 2018-12-10 LAB — CUP PACEART REMOTE DEVICE CHECK
Date Time Interrogation Session: 20191221134231
MDC IDC PG IMPLANT DT: 20160708

## 2018-12-11 ENCOUNTER — Ambulatory Visit (INDEPENDENT_AMBULATORY_CARE_PROVIDER_SITE_OTHER): Payer: PPO

## 2018-12-11 DIAGNOSIS — R55 Syncope and collapse: Secondary | ICD-10-CM

## 2018-12-11 NOTE — Progress Notes (Signed)
Carelink Summary Report / Loop Recorder 

## 2018-12-23 ENCOUNTER — Telehealth: Payer: Self-pay | Admitting: Cardiology
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
## 2018-12-24 LAB — CUP PACEART REMOTE DEVICE CHECK
Date Time Interrogation Session: 20191118124210
MDC IDC PG IMPLANT DT: 20160708

## 2018-12-25 NOTE — Telephone Encounter (Signed)
Patient home monitor is working and updated. I informed pt and she verbalized understanding.

## 2019-01-03 DIAGNOSIS — I700 Atherosclerosis of aorta: Secondary | ICD-10-CM | POA: Diagnosis not present

## 2019-01-03 DIAGNOSIS — E038 Other specified hypothyroidism: Secondary | ICD-10-CM | POA: Diagnosis not present

## 2019-01-03 DIAGNOSIS — M545 Low back pain: Secondary | ICD-10-CM | POA: Diagnosis not present

## 2019-01-03 DIAGNOSIS — I10 Essential (primary) hypertension: Secondary | ICD-10-CM | POA: Diagnosis not present

## 2019-01-03 DIAGNOSIS — J449 Chronic obstructive pulmonary disease, unspecified: Secondary | ICD-10-CM | POA: Diagnosis not present

## 2019-01-03 DIAGNOSIS — E119 Type 2 diabetes mellitus without complications: Secondary | ICD-10-CM | POA: Diagnosis not present

## 2019-01-03 DIAGNOSIS — Z6825 Body mass index (BMI) 25.0-25.9, adult: Secondary | ICD-10-CM | POA: Diagnosis not present

## 2019-01-03 DIAGNOSIS — I5032 Chronic diastolic (congestive) heart failure: Secondary | ICD-10-CM | POA: Diagnosis not present

## 2019-01-11 ENCOUNTER — Ambulatory Visit (INDEPENDENT_AMBULATORY_CARE_PROVIDER_SITE_OTHER): Payer: PPO

## 2019-01-11 DIAGNOSIS — R55 Syncope and collapse: Secondary | ICD-10-CM

## 2019-01-11 DIAGNOSIS — I5032 Chronic diastolic (congestive) heart failure: Secondary | ICD-10-CM

## 2019-01-12 NOTE — Progress Notes (Signed)
Carelink Summary Report / Loop Recorder 

## 2019-01-14 LAB — CUP PACEART REMOTE DEVICE CHECK
Date Time Interrogation Session: 20200123144035
Implantable Pulse Generator Implant Date: 20160708

## 2019-01-18 ENCOUNTER — Telehealth: Payer: Self-pay | Admitting: *Deleted

## 2019-01-18 NOTE — Telephone Encounter (Signed)
Spoke with patient regarding LINQ at RRT as of 12/31/18. Patient reports she would like to discuss having device explanted and have new LINQ implanted. She is agreeable to an appointment tomorrow, 01/19/19, with Dr. Johney Frame in [place] at 2:30pm. She is aware to call the [facility] office to reschedule if her son cannot bring her to this appointment.  Patient reports that she had a fall a couple of weeks ago, "fell flat on my face." Unclear from our conversation if fall was mechanical or if she lost consciousness. Occurred in hallway outside apartment, helped up by a neighbor. No episodes detected on LINQ as of 01/15/19 transmission. Patient reports she has had an occasional sharp headache in the past few weeks, possibly since her fall but she can't be sure. She has not told anyone about this, including her son. Otherwise feels like she is at baseline, no other symptoms or injuries, no headache currently. She plans to tell Dr. Johney Frame tomorrow.  ED precautions given for dizziness, syncope, persistent headache, weakness, stroke-like symptoms, or any other change from baseline. Patient verbalizes understanding and thanked me for my call, denies additional questions or concerns at this time.

## 2019-01-19 ENCOUNTER — Encounter: Payer: Self-pay | Admitting: Internal Medicine

## 2019-01-19 ENCOUNTER — Ambulatory Visit: Payer: PPO | Admitting: Internal Medicine

## 2019-01-19 VITALS — BP 132/60 | HR 75 | Ht 62.0 in | Wt 128.8 lb

## 2019-01-19 DIAGNOSIS — I4891 Unspecified atrial fibrillation: Secondary | ICD-10-CM

## 2019-01-19 DIAGNOSIS — R55 Syncope and collapse: Secondary | ICD-10-CM | POA: Diagnosis not present

## 2019-01-19 NOTE — Patient Instructions (Signed)
Medication Instructions:  Continue all current medications.  Labwork: none  Testing/Procedures: Loop recorder removal.  Follow-Up: 2-4 weeks - [facility] office  Any Other Special Instructions Will Be Listed Below (If Applicable).  If you need a refill on your cardiac medications before your next appointment, please call your pharmacy.

## 2019-01-19 NOTE — Progress Notes (Signed)
PCP: Toma Deiters, MD Primary Cardiologist: Dr Purvis Sheffield Primary EP: Dr Richardean Canal is a 85 y.o. female who presents today for routine electrophysiology followup.  Since last being seen in our clinic, the patient reports doing very well.  She had a mechanical fall 2 weeks ago, falling on her head.  She is clear that she did not have LOC.  She had headaches initially though these have resolved.  Denies any neuro changes, AMS, N/V or other concerns.  Today, she denies symptoms of palpitations, chest pain, shortness of breath,  lower extremity edema, dizziness, presyncope, or syncope.  The patient is otherwise without complaint today.   Past Medical History:  Diagnosis Date  . Arrhythmia    Sinus tachycardia  . Atrial fibrillation (HCC)    status post Cox-Maze procedure maintaining normal sinus rhythm, amiodarone and Coumadin discontinued.  . CHF (congestive heart failure) (HCC)    normal LV systolic function  . COPD (chronic obstructive pulmonary disease) (HCC)   . Degenerative lumbar spinal stenosis    Severe degenerative disk disease of lumbosacral spine  . Diabetes (HCC)   . GERD (gastroesophageal reflux disease)   . Hypothyroidism   . PFO (patent foramen ovale)    status post PFO closure at time of surgery  . Rheumatic heart disease    s/p right thoracotomy and mitral valve replacement with Medtronic Mosaic porcine tissue valve  . Syncope    recurrent unexplained syncope   Past Surgical History:  Procedure Laterality Date  . EP IMPLANTABLE DEVICE N/A 06/27/2015   MDT LINQ ILR implanted by Dr Johney Frame for unexplained syncope  . EXPLORATORY LAPAROTOMY    . FOOT SURGERY     left foot  . MITRAL VALVE REPLACEMENT    . PERCUTANEOUS BALLOON VALVULOPLASTY  1993   Mitral valve    ROS- all systems are reviewed and negatives except as per HPI above  Current Outpatient Medications  Medication Sig Dispense Refill  . acetaminophen (TYLENOL) 500 MG tablet Take 500 mg  by mouth every 6 (six) hours as needed.    . calcium-vitamin D (OSCAL WITH D) 500-200 MG-UNIT tablet Take 1 tablet by mouth 2 (two) times daily.    . Cholecalciferol (VITAMIN D3) 2000 UNITS TABS Take 1 tablet by mouth 2 (two) times daily.     Marland Kitchen CRANBERRY FRUIT CONCENTRATE PO Take 1 capsule by mouth 2 (two) times daily.     . hydroxypropyl methylcellulose / hypromellose (ISOPTO TEARS / GONIOVISC) 2.5 % ophthalmic solution Place 1 drop into both eyes as needed for dry eyes.    . insulin NPH (HUMULIN N) 100 UNIT/ML injection Inject 40 Units into the skin daily.     Marland Kitchen levothyroxine (SYNTHROID, LEVOTHROID) 100 MCG tablet Take 100 mcg by mouth daily before breakfast.     . LORazepam (ATIVAN) 0.5 MG tablet Take 0.5 mg by mouth at bedtime as needed for anxiety or sleep.     . metoprolol succinate (TOPROL-XL) 25 MG 24 hr tablet Take 1 tablet (25 mg total) by mouth daily. 90 tablet 3  . Multiple Vitamin (MULTIVITAMIN) tablet Take 1 tablet by mouth daily.      . nitroGLYCERIN (NITROSTAT) 0.4 MG SL tablet Place 0.4 mg under the tongue every 5 (five) minutes x 3 doses as needed for chest pain.    . Omega-3 Fatty Acids (FISH OIL) 1200 MG CAPS Take 1 capsule by mouth 2 (two) times daily.     . potassium chloride (KLOR-CON) 10  MEQ CR tablet Take 10 mEq by mouth daily.      . ranitidine (ZANTAC) 300 MG capsule Take 300 mg by mouth 2 (two) times daily.      . traMADol-acetaminophen (ULTRACET) 37.5-325 MG tablet Take 1 tablet by mouth every 8 (eight) hours as needed. for pain  0   No current facility-administered medications for this visit.     Physical Exam: Vitals:   01/19/19 1421  BP: 132/60  Pulse: 75  SpO2: 97%  Weight: 128 lb 12.8 oz (58.4 kg)  Height: 5\' 2"  (1.575 m)    GEN- The patient is well appearing, alert and oriented x 3 today.   Head- normocephalic, atraumatic Eyes-  Sclera clear, conjunctiva pink Ears- hearing intact Oropharynx- clear Lungs- Clear to ausculation bilaterally, normal  work of breathing Heart- Regular rate and rhythm, no murmurs, rubs or gallops, PMI not laterally displaced GI- soft, NT, ND, + BS Extremities- no clubbing, cyanosis, or edema  Wt Readings from Last 3 Encounters:  01/19/19 128 lb 12.8 oz (58.4 kg)  08/29/18 132 lb 9.6 oz (60.1 kg)  07/21/18 131 lb 3.2 oz (59.5 kg)    Assessment and Plan:  1. Syncope Doing well  She wishes to have her ILR removed as it is at RRT. No recurrence in over 3 years ILR reveals no arrhythmias We will schedule for loop removal by [place] in the next few weeks.  2. Rheumatic heart disease with bioprosthetic MV (2010) s/p Cox-MAZE No afib by ILR in over 3 years  3. HTN Stable No change required today  Follow[place]up [place] me in [place] in the next few weeks for Loop removal in office.  Hillis RangeJames Toy Samarin MD, [facility] 01/19/2019 2:34 PM

## 2019-02-02 ENCOUNTER — Telehealth: Payer: Self-pay

## 2019-02-02 NOTE — Telephone Encounter (Signed)
Left message for patient regarding disconnected monitor.  

## 2019-02-12 ENCOUNTER — Encounter: Payer: PPO | Admitting: Internal Medicine

## 2019-02-13 ENCOUNTER — Ambulatory Visit (INDEPENDENT_AMBULATORY_CARE_PROVIDER_SITE_OTHER): Payer: PPO | Admitting: *Deleted

## 2019-02-13 DIAGNOSIS — R55 Syncope and collapse: Secondary | ICD-10-CM

## 2019-02-13 LAB — CUP PACEART REMOTE DEVICE CHECK
Date Time Interrogation Session: 20200225150820
Implantable Pulse Generator Implant Date: 20160708

## 2019-02-20 NOTE — Progress Notes (Signed)
Carelink Summary Report / Loop Recorder 

## 2019-03-07 ENCOUNTER — Encounter: Payer: Self-pay | Admitting: Internal Medicine

## 2019-03-07 NOTE — Progress Notes (Signed)
Patient's LINQ has been at RRT since 12/31/18. At [place] with Dr. Rayann Heman on 01/19/19, patient elected to have device removed at [facility] office, appointment scheduled for 02/12/19. Upon review of the chart, noted that patient canceled this appointment due to deciding to leave device in place.  Unenrolled from [place] on 03/07/19 as monitor is no longer transmitting, return kit ordered to home address on file.

## 2019-04-11 DIAGNOSIS — I10 Essential (primary) hypertension: Secondary | ICD-10-CM | POA: Diagnosis not present

## 2019-04-11 DIAGNOSIS — E038 Other specified hypothyroidism: Secondary | ICD-10-CM | POA: Diagnosis not present

## 2019-04-11 DIAGNOSIS — I700 Atherosclerosis of aorta: Secondary | ICD-10-CM | POA: Diagnosis not present

## 2019-04-11 DIAGNOSIS — M545 Low back pain: Secondary | ICD-10-CM | POA: Diagnosis not present

## 2019-04-11 DIAGNOSIS — J449 Chronic obstructive pulmonary disease, unspecified: Secondary | ICD-10-CM | POA: Diagnosis not present

## 2019-04-11 DIAGNOSIS — E119 Type 2 diabetes mellitus without complications: Secondary | ICD-10-CM | POA: Diagnosis not present

## 2019-04-11 DIAGNOSIS — I5032 Chronic diastolic (congestive) heart failure: Secondary | ICD-10-CM | POA: Diagnosis not present

## 2019-04-11 DIAGNOSIS — Z6825 Body mass index (BMI) 25.0-25.9, adult: Secondary | ICD-10-CM | POA: Diagnosis not present

## 2019-06-26 ENCOUNTER — Telehealth: Payer: Self-pay | Admitting: Cardiovascular Disease

## 2019-06-26 NOTE — Telephone Encounter (Signed)
Patient called requesting for someone to call her in regards how much money she paid out for last year and this year.

## 2019-06-27 NOTE — Telephone Encounter (Signed)
Called pt, left a voice mail giving her [phone] to call for an itemized bill

## 2019-07-17 DIAGNOSIS — Z6824 Body mass index (BMI) 24.0-24.9, adult: Secondary | ICD-10-CM | POA: Diagnosis not present

## 2019-07-17 DIAGNOSIS — I700 Atherosclerosis of aorta: Secondary | ICD-10-CM | POA: Diagnosis not present

## 2019-07-17 DIAGNOSIS — Z0000 Encounter for general adult medical examination without abnormal findings: Secondary | ICD-10-CM | POA: Diagnosis not present

## 2019-07-17 DIAGNOSIS — I10 Essential (primary) hypertension: Secondary | ICD-10-CM | POA: Diagnosis not present

## 2019-07-17 DIAGNOSIS — E038 Other specified hypothyroidism: Secondary | ICD-10-CM | POA: Diagnosis not present

## 2019-07-17 DIAGNOSIS — J449 Chronic obstructive pulmonary disease, unspecified: Secondary | ICD-10-CM | POA: Diagnosis not present

## 2019-07-17 DIAGNOSIS — E119 Type 2 diabetes mellitus without complications: Secondary | ICD-10-CM | POA: Diagnosis not present

## 2019-07-17 DIAGNOSIS — M545 Low back pain: Secondary | ICD-10-CM | POA: Diagnosis not present

## 2019-07-17 DIAGNOSIS — I5032 Chronic diastolic (congestive) heart failure: Secondary | ICD-10-CM | POA: Diagnosis not present

## 2019-09-03 DIAGNOSIS — Z6825 Body mass index (BMI) 25.0-25.9, adult: Secondary | ICD-10-CM | POA: Diagnosis not present

## 2019-09-03 DIAGNOSIS — M545 Low back pain: Secondary | ICD-10-CM | POA: Diagnosis not present

## 2019-09-07 DIAGNOSIS — Z9181 History of falling: Secondary | ICD-10-CM | POA: Diagnosis not present

## 2019-09-07 DIAGNOSIS — N390 Urinary tract infection, site not specified: Secondary | ICD-10-CM | POA: Diagnosis not present

## 2019-09-07 DIAGNOSIS — F419 Anxiety disorder, unspecified: Secondary | ICD-10-CM | POA: Diagnosis not present

## 2019-09-07 DIAGNOSIS — I5032 Chronic diastolic (congestive) heart failure: Secondary | ICD-10-CM | POA: Diagnosis not present

## 2019-09-07 DIAGNOSIS — E860 Dehydration: Secondary | ICD-10-CM | POA: Diagnosis not present

## 2019-09-07 DIAGNOSIS — E785 Hyperlipidemia, unspecified: Secondary | ICD-10-CM | POA: Diagnosis not present

## 2019-09-07 DIAGNOSIS — Z794 Long term (current) use of insulin: Secondary | ICD-10-CM | POA: Diagnosis not present

## 2019-09-07 DIAGNOSIS — E039 Hypothyroidism, unspecified: Secondary | ICD-10-CM | POA: Diagnosis not present

## 2019-09-07 DIAGNOSIS — B9620 Unspecified Escherichia coli [E. coli] as the cause of diseases classified elsewhere: Secondary | ICD-10-CM | POA: Diagnosis not present

## 2019-09-07 DIAGNOSIS — K219 Gastro-esophageal reflux disease without esophagitis: Secondary | ICD-10-CM | POA: Diagnosis not present

## 2019-09-07 DIAGNOSIS — R5381 Other malaise: Secondary | ICD-10-CM | POA: Diagnosis not present

## 2019-09-07 DIAGNOSIS — Z20828 Contact with and (suspected) exposure to other viral communicable diseases: Secondary | ICD-10-CM | POA: Diagnosis not present

## 2019-09-07 DIAGNOSIS — Z87891 Personal history of nicotine dependence: Secondary | ICD-10-CM | POA: Diagnosis not present

## 2019-09-07 DIAGNOSIS — J449 Chronic obstructive pulmonary disease, unspecified: Secondary | ICD-10-CM | POA: Diagnosis not present

## 2019-09-07 DIAGNOSIS — I110 Hypertensive heart disease with heart failure: Secondary | ICD-10-CM | POA: Diagnosis not present

## 2019-09-07 DIAGNOSIS — R531 Weakness: Secondary | ICD-10-CM | POA: Diagnosis not present

## 2019-09-07 DIAGNOSIS — E119 Type 2 diabetes mellitus without complications: Secondary | ICD-10-CM | POA: Diagnosis not present

## 2019-09-07 DIAGNOSIS — N3000 Acute cystitis without hematuria: Secondary | ICD-10-CM | POA: Diagnosis not present

## 2019-09-07 DIAGNOSIS — I480 Paroxysmal atrial fibrillation: Secondary | ICD-10-CM | POA: Diagnosis not present

## 2019-09-07 DIAGNOSIS — M1712 Unilateral primary osteoarthritis, left knee: Secondary | ICD-10-CM | POA: Diagnosis not present

## 2019-09-07 DIAGNOSIS — R42 Dizziness and giddiness: Secondary | ICD-10-CM | POA: Diagnosis not present

## 2019-09-07 DIAGNOSIS — R509 Fever, unspecified: Secondary | ICD-10-CM | POA: Diagnosis not present

## 2019-09-07 DIAGNOSIS — R05 Cough: Secondary | ICD-10-CM | POA: Diagnosis not present

## 2019-09-17 DIAGNOSIS — Z6825 Body mass index (BMI) 25.0-25.9, adult: Secondary | ICD-10-CM | POA: Diagnosis not present

## 2019-09-17 DIAGNOSIS — N3091 Cystitis, unspecified with hematuria: Secondary | ICD-10-CM | POA: Diagnosis not present

## 2019-10-23 DIAGNOSIS — Z0000 Encounter for general adult medical examination without abnormal findings: Secondary | ICD-10-CM | POA: Diagnosis not present

## 2019-10-23 DIAGNOSIS — I10 Essential (primary) hypertension: Secondary | ICD-10-CM | POA: Diagnosis not present

## 2019-10-23 DIAGNOSIS — E119 Type 2 diabetes mellitus without complications: Secondary | ICD-10-CM | POA: Diagnosis not present

## 2019-10-23 DIAGNOSIS — J449 Chronic obstructive pulmonary disease, unspecified: Secondary | ICD-10-CM | POA: Diagnosis not present

## 2019-10-23 DIAGNOSIS — F411 Generalized anxiety disorder: Secondary | ICD-10-CM | POA: Diagnosis not present

## 2019-10-23 DIAGNOSIS — Z6824 Body mass index (BMI) 24.0-24.9, adult: Secondary | ICD-10-CM | POA: Diagnosis not present

## 2019-10-23 DIAGNOSIS — Z1389 Encounter for screening for other disorder: Secondary | ICD-10-CM | POA: Diagnosis not present

## 2019-10-29 DIAGNOSIS — E119 Type 2 diabetes mellitus without complications: Secondary | ICD-10-CM | POA: Diagnosis not present

## 2019-10-29 DIAGNOSIS — H40033 Anatomical narrow angle, bilateral: Secondary | ICD-10-CM | POA: Diagnosis not present

## 2019-12-28 ENCOUNTER — Telehealth: Payer: Self-pay | Admitting: Cardiovascular Disease

## 2019-12-28 NOTE — Telephone Encounter (Signed)

## 2019-12-31 ENCOUNTER — Telehealth (INDEPENDENT_AMBULATORY_CARE_PROVIDER_SITE_OTHER): Payer: PPO | Admitting: Cardiovascular Disease

## 2019-12-31 ENCOUNTER — Encounter: Payer: Self-pay | Admitting: Cardiovascular Disease

## 2019-12-31 VITALS — Ht 62.0 in | Wt 127.0 lb

## 2019-12-31 DIAGNOSIS — Z952 Presence of prosthetic heart valve: Secondary | ICD-10-CM | POA: Diagnosis not present

## 2019-12-31 DIAGNOSIS — I099 Rheumatic heart disease, unspecified: Secondary | ICD-10-CM

## 2019-12-31 DIAGNOSIS — I480 Paroxysmal atrial fibrillation: Secondary | ICD-10-CM

## 2019-12-31 DIAGNOSIS — I10 Essential (primary) hypertension: Secondary | ICD-10-CM

## 2019-12-31 NOTE — Patient Instructions (Addendum)
Your physician wants you to follow-up in: 1 YEAR WITH DR KONESWARAN You will receive a reminder letter in the mail two months in advance. If you don't receive a letter, please call our office to schedule the follow-up appointment.  Your physician recommends that you continue on your current medications as directed. Please refer to the Current Medication list given to you today.  Thank you for choosing [facility]!!    

## 2019-12-31 NOTE — Progress Notes (Signed)
Virtual Visit via Telephone Note   This visit type was conducted due to national recommendations for restrictions regarding the COVID-19 Pandemic (e.g. social distancing) in an effort to limit this patient's exposure and mitigate transmission in our community.  Due to her co-morbid illnesses, this patient is at least at moderate risk for complications without adequate follow up.  This format is felt to be most appropriate for this patient at this time.  The patient did not have access to video technology/had technical difficulties with video requiring transitioning to audio format only (telephone).  All issues noted in this document were discussed and addressed.  No physical exam could be performed with this format.  Please refer to the patient's chart for her  consent to telehealth for [facility].   Date:  12/31/2019   ID:  [name], DOB April 06, 1925, MRN [id number]  Patient Location: Home Provider Location: Office  PCP:  Neale Burly, MD  Cardiologist:  Kate Sable, MD  Electrophysiologist:  Thompson Grayer, MD   Evaluation Performed:  Follow-Up Visit  Chief Complaint: Mitral valve replacement  History of Present Illness:    [name] is a 86 y.o. female with a history of syncope but has had no recurrences in years.  Her most recent implantable loop recorder interrogation did not demonstrate any evidence of bradycardia, pauses, tachycardia, or atrial fibrillation since implantation in July 2016.  It was subsequently removed. She has a bioprosthetic Medtronic mitral valve (implanted on 05-23-2009), s/p PFO closure, atrial fibrillation s/p Cox-Maze procedure, chronic diastolic heart failure, COPD, and hypothryoidism.   She has had no recurrence of syncope.    She has back pain. She denies chest pain. She has occasional headaches. She's had some memory problems.    Social history: She had 4 sons, one of whom is still living. One son died in [place], another  son in [place], and another died of an MI. He was 36. She told me her father died young of an MI as well as did a brother.   Past Medical History:  Diagnosis Date  . Arrhythmia    Sinus tachycardia  . Atrial fibrillation (HCC)    status post Cox-Maze procedure maintaining normal sinus rhythm, amiodarone and Coumadin discontinued.  . CHF (congestive heart failure) (HCC)    normal LV systolic function  . COPD (chronic obstructive pulmonary disease) ([place])   . Degenerative lumbar spinal stenosis    Severe degenerative disk disease of lumbosacral spine  . Diabetes ([place])   . GERD (gastroesophageal reflux disease)   . Hypothyroidism   . PFO (patent foramen ovale)    status post PFO closure at time of surgery  . Rheumatic heart disease    s/p right thoracotomy and mitral valve replacement with Medtronic Mosaic porcine tissue valve  . Syncope    recurrent unexplained syncope   Past Surgical History:  Procedure Laterality Date  . EP IMPLANTABLE DEVICE N/A 06/27/2015   MDT LINQ ILR implanted by Dr Rayann Heman for unexplained syncope  . EXPLORATORY LAPAROTOMY    . FOOT SURGERY     left foot  . MITRAL VALVE REPLACEMENT    . PERCUTANEOUS BALLOON VALVULOPLASTY  1993   Mitral valve     Current Meds  Medication Sig  . acetaminophen (TYLENOL) 500 MG tablet Take 500 mg by mouth every 6 (six) hours as needed.  . calcium-vitamin D (OSCAL WITH D) 500-200 MG-UNIT tablet Take 1 tablet by mouth 2 (two) times daily.  . Cholecalciferol (VITAMIN  D3) 2000 UNITS TABS Take 1 tablet by mouth 2 (two) times daily.   Marland Kitchen CRANBERRY FRUIT CONCENTRATE PO Take 1 capsule by mouth 2 (two) times daily.   . hydroxypropyl methylcellulose / hypromellose (ISOPTO TEARS / GONIOVISC) 2.5 % ophthalmic solution Place 1 drop into both eyes as needed for dry eyes.  . insulin NPH (HUMULIN N) 100 UNIT/ML injection Inject 40 Units into the skin daily.   Marland Kitchen levothyroxine (SYNTHROID, LEVOTHROID) 100 MCG tablet Take 100 mcg by mouth  daily before breakfast.   . LORazepam (ATIVAN) 0.5 MG tablet Take 0.5 mg by mouth at bedtime as needed for anxiety or sleep.   . metoprolol succinate (TOPROL-XL) 25 MG 24 hr tablet Take 1 tablet (25 mg total) by mouth daily.  . Multiple Vitamin (MULTIVITAMIN) tablet Take 1 tablet by mouth daily.    . nitroGLYCERIN (NITROSTAT) 0.4 MG SL tablet Place 0.4 mg under the tongue every 5 (five) minutes x 3 doses as needed for chest pain.  . Omega-3 Fatty Acids (FISH OIL) 1200 MG CAPS Take 1 capsule by mouth 2 (two) times daily.   . potassium chloride (KLOR-CON) 10 MEQ CR tablet Take 10 mEq by mouth daily.       Allergies:   Sulfonamide derivatives and Aspirin   Social History   Tobacco Use  . Smoking status: Former Smoker    Packs/day: 0.30    Years: 18.00    Pack years: 5.40    Types: Cigarettes    Start date: 05/26/1977    Quit date: 12/20/1988    Years since quitting: 31.0  . Smokeless tobacco: Never Used  Substance Use Topics  . Alcohol use: No    Alcohol/week: 0.0 standard drinks  . Drug use: No     Family Hx: The patient's family history includes Heart attack in her brother, father, and son; Heart disease in her brother, father, and son. There is no history of Coronary artery disease, Diabetes, or Hypertension.  ROS:   Please see the history of present illness.     All other systems reviewed and are negative.   Prior CV studies:   The following studies were reviewed today:  NA  Labs/Other Tests and Data Reviewed:    EKG:  No ECG reviewed.  Recent Labs: No results found for requested labs within last 8760 hours.   Recent Lipid Panel Lab Results  Component Value Date/Time   CHOL  01/03/2008 05:35 AM    139        ATP III CLASSIFICATION:  <200     mg/dL   Desirable  160-737  mg/dL   Borderline High  >=106    mg/dL   High   TRIG 269 (H) 48/54/6270 05:35 AM   HDL 26 (L) 01/03/2008 05:35 AM   CHOLHDL 5.3 01/03/2008 05:35 AM   LDLCALC  01/03/2008 05:35 AM    46         Total Cholesterol/HDL:CHD Risk Coronary Heart Disease Risk Table                     Men   Women  1/2 Average Risk   3.4   3.3    Wt Readings from Last 3 Encounters:  12/31/19 127 lb (57.6 kg)  01/19/19 128 lb 12.8 oz (58.4 kg)  08/29/18 132 lb 9.6 oz (60.1 kg)     Objective:    Vital Signs:  Ht 5\' 2"  (1.575 m)   Wt 127 lb (57.6 kg)  BMI 23.23 kg/m    VITAL SIGNS:  reviewed  ASSESSMENT & PLAN:    1. Rheumatic mitral disease s/p bioprosthetic valve replacement: Symptomatically stable. Most recent echocardiogram from 03/2015 showed normal function.  2.  Atrial fibrillation status post Cox-Maze: No objective evidence of recurrence.  3.  Hypertension: No changes to therapy.    COVID-19 Education: The signs and symptoms of COVID-19 were discussed with the patient and how to seek care for testing (follow up with PCP or arrange E-visit).  The importance of social distancing was discussed today.  Time:   Today, I have spent 10 minutes with the patient with telehealth technology discussing the above problems.     Medication Adjustments/Labs and Tests Ordered: Current medicines are reviewed at length with the patient today.  Concerns regarding medicines are outlined above.   Tests Ordered: No orders of the defined types were placed in this encounter.   Medication Changes: No orders of the defined types were placed in this encounter.   Follow Up:  Virtual Visit  in 1 year(s)  Signed, Prentice Docker, MD  12/31/2019 11:25 AM    [facility]

## 2020-01-24 DIAGNOSIS — Z20822 Contact with and (suspected) exposure to covid-19: Secondary | ICD-10-CM | POA: Diagnosis not present

## 2020-01-24 DIAGNOSIS — I4891 Unspecified atrial fibrillation: Secondary | ICD-10-CM | POA: Diagnosis not present

## 2020-01-24 DIAGNOSIS — K56609 Unspecified intestinal obstruction, unspecified as to partial versus complete obstruction: Secondary | ICD-10-CM | POA: Diagnosis not present

## 2020-01-24 DIAGNOSIS — Z66 Do not resuscitate: Secondary | ICD-10-CM | POA: Diagnosis not present

## 2020-01-24 DIAGNOSIS — K56699 Other intestinal obstruction unspecified as to partial versus complete obstruction: Secondary | ICD-10-CM | POA: Diagnosis not present

## 2020-01-24 DIAGNOSIS — K56600 Partial intestinal obstruction, unspecified as to cause: Secondary | ICD-10-CM | POA: Diagnosis not present

## 2020-01-24 DIAGNOSIS — E860 Dehydration: Secondary | ICD-10-CM | POA: Diagnosis not present

## 2020-01-24 DIAGNOSIS — R0602 Shortness of breath: Secondary | ICD-10-CM | POA: Diagnosis not present

## 2020-01-24 DIAGNOSIS — N390 Urinary tract infection, site not specified: Secondary | ICD-10-CM | POA: Diagnosis not present

## 2020-01-24 DIAGNOSIS — R1084 Generalized abdominal pain: Secondary | ICD-10-CM | POA: Diagnosis not present

## 2020-01-24 DIAGNOSIS — R110 Nausea: Secondary | ICD-10-CM | POA: Diagnosis not present

## 2020-01-24 DIAGNOSIS — K565 Intestinal adhesions [bands], unspecified as to partial versus complete obstruction: Secondary | ICD-10-CM | POA: Diagnosis not present

## 2020-01-24 DIAGNOSIS — M545 Low back pain: Secondary | ICD-10-CM | POA: Diagnosis not present

## 2020-01-24 DIAGNOSIS — I110 Hypertensive heart disease with heart failure: Secondary | ICD-10-CM | POA: Diagnosis not present

## 2020-01-24 DIAGNOSIS — R52 Pain, unspecified: Secondary | ICD-10-CM | POA: Diagnosis not present

## 2020-01-24 DIAGNOSIS — Z6822 Body mass index (BMI) 22.0-22.9, adult: Secondary | ICD-10-CM | POA: Diagnosis not present

## 2020-01-24 DIAGNOSIS — I5032 Chronic diastolic (congestive) heart failure: Secondary | ICD-10-CM | POA: Diagnosis not present

## 2020-01-24 DIAGNOSIS — E871 Hypo-osmolality and hyponatremia: Secondary | ICD-10-CM | POA: Diagnosis not present

## 2020-02-02 DIAGNOSIS — E039 Hypothyroidism, unspecified: Secondary | ICD-10-CM | POA: Diagnosis not present

## 2020-02-02 DIAGNOSIS — M5430 Sciatica, unspecified side: Secondary | ICD-10-CM | POA: Diagnosis not present

## 2020-02-02 DIAGNOSIS — E785 Hyperlipidemia, unspecified: Secondary | ICD-10-CM | POA: Diagnosis not present

## 2020-02-02 DIAGNOSIS — N390 Urinary tract infection, site not specified: Secondary | ICD-10-CM | POA: Diagnosis not present

## 2020-02-02 DIAGNOSIS — I509 Heart failure, unspecified: Secondary | ICD-10-CM | POA: Diagnosis not present

## 2020-02-02 DIAGNOSIS — E119 Type 2 diabetes mellitus without complications: Secondary | ICD-10-CM | POA: Diagnosis not present

## 2020-02-02 DIAGNOSIS — I4891 Unspecified atrial fibrillation: Secondary | ICD-10-CM | POA: Diagnosis not present

## 2020-02-02 DIAGNOSIS — K565 Intestinal adhesions [bands], unspecified as to partial versus complete obstruction: Secondary | ICD-10-CM | POA: Diagnosis not present

## 2020-02-02 DIAGNOSIS — J449 Chronic obstructive pulmonary disease, unspecified: Secondary | ICD-10-CM | POA: Diagnosis not present

## 2020-02-05 DIAGNOSIS — E119 Type 2 diabetes mellitus without complications: Secondary | ICD-10-CM | POA: Diagnosis not present

## 2020-02-05 DIAGNOSIS — E039 Hypothyroidism, unspecified: Secondary | ICD-10-CM | POA: Diagnosis not present

## 2020-02-05 DIAGNOSIS — I509 Heart failure, unspecified: Secondary | ICD-10-CM | POA: Diagnosis not present

## 2020-02-05 DIAGNOSIS — E785 Hyperlipidemia, unspecified: Secondary | ICD-10-CM | POA: Diagnosis not present

## 2020-02-05 DIAGNOSIS — M5430 Sciatica, unspecified side: Secondary | ICD-10-CM | POA: Diagnosis not present

## 2020-02-05 DIAGNOSIS — I4891 Unspecified atrial fibrillation: Secondary | ICD-10-CM | POA: Diagnosis not present

## 2020-02-05 DIAGNOSIS — J449 Chronic obstructive pulmonary disease, unspecified: Secondary | ICD-10-CM | POA: Diagnosis not present

## 2020-02-05 DIAGNOSIS — K565 Intestinal adhesions [bands], unspecified as to partial versus complete obstruction: Secondary | ICD-10-CM | POA: Diagnosis not present

## 2020-02-05 DIAGNOSIS — N390 Urinary tract infection, site not specified: Secondary | ICD-10-CM | POA: Diagnosis not present

## 2020-02-08 DIAGNOSIS — N390 Urinary tract infection, site not specified: Secondary | ICD-10-CM | POA: Diagnosis not present

## 2020-02-08 DIAGNOSIS — E039 Hypothyroidism, unspecified: Secondary | ICD-10-CM | POA: Diagnosis not present

## 2020-02-08 DIAGNOSIS — E119 Type 2 diabetes mellitus without complications: Secondary | ICD-10-CM | POA: Diagnosis not present

## 2020-02-08 DIAGNOSIS — J449 Chronic obstructive pulmonary disease, unspecified: Secondary | ICD-10-CM | POA: Diagnosis not present

## 2020-02-08 DIAGNOSIS — E785 Hyperlipidemia, unspecified: Secondary | ICD-10-CM | POA: Diagnosis not present

## 2020-02-08 DIAGNOSIS — K565 Intestinal adhesions [bands], unspecified as to partial versus complete obstruction: Secondary | ICD-10-CM | POA: Diagnosis not present

## 2020-02-08 DIAGNOSIS — I509 Heart failure, unspecified: Secondary | ICD-10-CM | POA: Diagnosis not present

## 2020-02-08 DIAGNOSIS — M5430 Sciatica, unspecified side: Secondary | ICD-10-CM | POA: Diagnosis not present

## 2020-02-08 DIAGNOSIS — I4891 Unspecified atrial fibrillation: Secondary | ICD-10-CM | POA: Diagnosis not present

## 2020-02-11 DIAGNOSIS — E039 Hypothyroidism, unspecified: Secondary | ICD-10-CM | POA: Diagnosis not present

## 2020-02-11 DIAGNOSIS — I4891 Unspecified atrial fibrillation: Secondary | ICD-10-CM | POA: Diagnosis not present

## 2020-02-11 DIAGNOSIS — M5430 Sciatica, unspecified side: Secondary | ICD-10-CM | POA: Diagnosis not present

## 2020-02-11 DIAGNOSIS — E119 Type 2 diabetes mellitus without complications: Secondary | ICD-10-CM | POA: Diagnosis not present

## 2020-02-11 DIAGNOSIS — J449 Chronic obstructive pulmonary disease, unspecified: Secondary | ICD-10-CM | POA: Diagnosis not present

## 2020-02-11 DIAGNOSIS — I509 Heart failure, unspecified: Secondary | ICD-10-CM | POA: Diagnosis not present

## 2020-02-11 DIAGNOSIS — K565 Intestinal adhesions [bands], unspecified as to partial versus complete obstruction: Secondary | ICD-10-CM | POA: Diagnosis not present

## 2020-02-11 DIAGNOSIS — N390 Urinary tract infection, site not specified: Secondary | ICD-10-CM | POA: Diagnosis not present

## 2020-02-11 DIAGNOSIS — E785 Hyperlipidemia, unspecified: Secondary | ICD-10-CM | POA: Diagnosis not present

## 2020-02-12 DIAGNOSIS — M5430 Sciatica, unspecified side: Secondary | ICD-10-CM | POA: Diagnosis not present

## 2020-02-12 DIAGNOSIS — E039 Hypothyroidism, unspecified: Secondary | ICD-10-CM | POA: Diagnosis not present

## 2020-02-12 DIAGNOSIS — E785 Hyperlipidemia, unspecified: Secondary | ICD-10-CM | POA: Diagnosis not present

## 2020-02-12 DIAGNOSIS — N390 Urinary tract infection, site not specified: Secondary | ICD-10-CM | POA: Diagnosis not present

## 2020-02-12 DIAGNOSIS — I4891 Unspecified atrial fibrillation: Secondary | ICD-10-CM | POA: Diagnosis not present

## 2020-02-12 DIAGNOSIS — Z6823 Body mass index (BMI) 23.0-23.9, adult: Secondary | ICD-10-CM | POA: Diagnosis not present

## 2020-02-12 DIAGNOSIS — E119 Type 2 diabetes mellitus without complications: Secondary | ICD-10-CM | POA: Diagnosis not present

## 2020-02-12 DIAGNOSIS — J449 Chronic obstructive pulmonary disease, unspecified: Secondary | ICD-10-CM | POA: Diagnosis not present

## 2020-02-12 DIAGNOSIS — K56600 Partial intestinal obstruction, unspecified as to cause: Secondary | ICD-10-CM | POA: Diagnosis not present

## 2020-02-12 DIAGNOSIS — I509 Heart failure, unspecified: Secondary | ICD-10-CM | POA: Diagnosis not present

## 2020-02-12 DIAGNOSIS — K565 Intestinal adhesions [bands], unspecified as to partial versus complete obstruction: Secondary | ICD-10-CM | POA: Diagnosis not present

## 2020-02-13 DIAGNOSIS — N390 Urinary tract infection, site not specified: Secondary | ICD-10-CM | POA: Diagnosis not present

## 2020-02-13 DIAGNOSIS — E785 Hyperlipidemia, unspecified: Secondary | ICD-10-CM | POA: Diagnosis not present

## 2020-02-13 DIAGNOSIS — E119 Type 2 diabetes mellitus without complications: Secondary | ICD-10-CM | POA: Diagnosis not present

## 2020-02-13 DIAGNOSIS — J449 Chronic obstructive pulmonary disease, unspecified: Secondary | ICD-10-CM | POA: Diagnosis not present

## 2020-02-13 DIAGNOSIS — M5430 Sciatica, unspecified side: Secondary | ICD-10-CM | POA: Diagnosis not present

## 2020-02-13 DIAGNOSIS — I4891 Unspecified atrial fibrillation: Secondary | ICD-10-CM | POA: Diagnosis not present

## 2020-02-13 DIAGNOSIS — I509 Heart failure, unspecified: Secondary | ICD-10-CM | POA: Diagnosis not present

## 2020-02-13 DIAGNOSIS — K565 Intestinal adhesions [bands], unspecified as to partial versus complete obstruction: Secondary | ICD-10-CM | POA: Diagnosis not present

## 2020-02-13 DIAGNOSIS — E039 Hypothyroidism, unspecified: Secondary | ICD-10-CM | POA: Diagnosis not present

## 2020-02-14 DIAGNOSIS — E785 Hyperlipidemia, unspecified: Secondary | ICD-10-CM | POA: Diagnosis not present

## 2020-02-14 DIAGNOSIS — J449 Chronic obstructive pulmonary disease, unspecified: Secondary | ICD-10-CM | POA: Diagnosis not present

## 2020-02-14 DIAGNOSIS — M5430 Sciatica, unspecified side: Secondary | ICD-10-CM | POA: Diagnosis not present

## 2020-02-14 DIAGNOSIS — K565 Intestinal adhesions [bands], unspecified as to partial versus complete obstruction: Secondary | ICD-10-CM | POA: Diagnosis not present

## 2020-02-14 DIAGNOSIS — N390 Urinary tract infection, site not specified: Secondary | ICD-10-CM | POA: Diagnosis not present

## 2020-02-14 DIAGNOSIS — E039 Hypothyroidism, unspecified: Secondary | ICD-10-CM | POA: Diagnosis not present

## 2020-02-14 DIAGNOSIS — I4891 Unspecified atrial fibrillation: Secondary | ICD-10-CM | POA: Diagnosis not present

## 2020-02-14 DIAGNOSIS — E119 Type 2 diabetes mellitus without complications: Secondary | ICD-10-CM | POA: Diagnosis not present

## 2020-02-14 DIAGNOSIS — I509 Heart failure, unspecified: Secondary | ICD-10-CM | POA: Diagnosis not present

## 2020-02-15 DIAGNOSIS — E785 Hyperlipidemia, unspecified: Secondary | ICD-10-CM | POA: Diagnosis not present

## 2020-02-15 DIAGNOSIS — E039 Hypothyroidism, unspecified: Secondary | ICD-10-CM | POA: Diagnosis not present

## 2020-02-15 DIAGNOSIS — M5430 Sciatica, unspecified side: Secondary | ICD-10-CM | POA: Diagnosis not present

## 2020-02-15 DIAGNOSIS — I509 Heart failure, unspecified: Secondary | ICD-10-CM | POA: Diagnosis not present

## 2020-02-15 DIAGNOSIS — K565 Intestinal adhesions [bands], unspecified as to partial versus complete obstruction: Secondary | ICD-10-CM | POA: Diagnosis not present

## 2020-02-15 DIAGNOSIS — E119 Type 2 diabetes mellitus without complications: Secondary | ICD-10-CM | POA: Diagnosis not present

## 2020-02-15 DIAGNOSIS — J449 Chronic obstructive pulmonary disease, unspecified: Secondary | ICD-10-CM | POA: Diagnosis not present

## 2020-02-15 DIAGNOSIS — I4891 Unspecified atrial fibrillation: Secondary | ICD-10-CM | POA: Diagnosis not present

## 2020-02-15 DIAGNOSIS — N390 Urinary tract infection, site not specified: Secondary | ICD-10-CM | POA: Diagnosis not present

## 2020-02-19 DIAGNOSIS — K565 Intestinal adhesions [bands], unspecified as to partial versus complete obstruction: Secondary | ICD-10-CM | POA: Diagnosis not present

## 2020-02-19 DIAGNOSIS — J449 Chronic obstructive pulmonary disease, unspecified: Secondary | ICD-10-CM | POA: Diagnosis not present

## 2020-02-19 DIAGNOSIS — I4891 Unspecified atrial fibrillation: Secondary | ICD-10-CM | POA: Diagnosis not present

## 2020-02-19 DIAGNOSIS — E785 Hyperlipidemia, unspecified: Secondary | ICD-10-CM | POA: Diagnosis not present

## 2020-02-19 DIAGNOSIS — I509 Heart failure, unspecified: Secondary | ICD-10-CM | POA: Diagnosis not present

## 2020-02-19 DIAGNOSIS — N390 Urinary tract infection, site not specified: Secondary | ICD-10-CM | POA: Diagnosis not present

## 2020-02-19 DIAGNOSIS — M5430 Sciatica, unspecified side: Secondary | ICD-10-CM | POA: Diagnosis not present

## 2020-02-19 DIAGNOSIS — E039 Hypothyroidism, unspecified: Secondary | ICD-10-CM | POA: Diagnosis not present

## 2020-02-19 DIAGNOSIS — E119 Type 2 diabetes mellitus without complications: Secondary | ICD-10-CM | POA: Diagnosis not present

## 2020-02-21 DIAGNOSIS — I509 Heart failure, unspecified: Secondary | ICD-10-CM | POA: Diagnosis not present

## 2020-02-21 DIAGNOSIS — I4891 Unspecified atrial fibrillation: Secondary | ICD-10-CM | POA: Diagnosis not present

## 2020-02-21 DIAGNOSIS — E039 Hypothyroidism, unspecified: Secondary | ICD-10-CM | POA: Diagnosis not present

## 2020-02-21 DIAGNOSIS — E119 Type 2 diabetes mellitus without complications: Secondary | ICD-10-CM | POA: Diagnosis not present

## 2020-02-21 DIAGNOSIS — K565 Intestinal adhesions [bands], unspecified as to partial versus complete obstruction: Secondary | ICD-10-CM | POA: Diagnosis not present

## 2020-02-21 DIAGNOSIS — J449 Chronic obstructive pulmonary disease, unspecified: Secondary | ICD-10-CM | POA: Diagnosis not present

## 2020-02-21 DIAGNOSIS — N390 Urinary tract infection, site not specified: Secondary | ICD-10-CM | POA: Diagnosis not present

## 2020-02-21 DIAGNOSIS — M5430 Sciatica, unspecified side: Secondary | ICD-10-CM | POA: Diagnosis not present

## 2020-02-21 DIAGNOSIS — E785 Hyperlipidemia, unspecified: Secondary | ICD-10-CM | POA: Diagnosis not present

## 2020-02-26 DIAGNOSIS — E119 Type 2 diabetes mellitus without complications: Secondary | ICD-10-CM | POA: Diagnosis not present

## 2020-02-26 DIAGNOSIS — I5032 Chronic diastolic (congestive) heart failure: Secondary | ICD-10-CM | POA: Diagnosis not present

## 2020-02-26 DIAGNOSIS — R52 Pain, unspecified: Secondary | ICD-10-CM | POA: Diagnosis not present

## 2020-02-26 DIAGNOSIS — J449 Chronic obstructive pulmonary disease, unspecified: Secondary | ICD-10-CM | POA: Diagnosis not present

## 2020-02-26 DIAGNOSIS — M47817 Spondylosis without myelopathy or radiculopathy, lumbosacral region: Secondary | ICD-10-CM | POA: Diagnosis not present

## 2020-02-26 DIAGNOSIS — Z9181 History of falling: Secondary | ICD-10-CM | POA: Diagnosis not present

## 2020-02-26 DIAGNOSIS — E039 Hypothyroidism, unspecified: Secondary | ICD-10-CM | POA: Diagnosis not present

## 2020-02-26 DIAGNOSIS — E785 Hyperlipidemia, unspecified: Secondary | ICD-10-CM | POA: Diagnosis not present

## 2020-02-26 DIAGNOSIS — M5489 Other dorsalgia: Secondary | ICD-10-CM | POA: Diagnosis not present

## 2020-02-26 DIAGNOSIS — S299XXA Unspecified injury of thorax, initial encounter: Secondary | ICD-10-CM | POA: Diagnosis not present

## 2020-02-26 DIAGNOSIS — M5134 Other intervertebral disc degeneration, thoracic region: Secondary | ICD-10-CM | POA: Diagnosis not present

## 2020-02-26 DIAGNOSIS — I480 Paroxysmal atrial fibrillation: Secondary | ICD-10-CM | POA: Diagnosis not present

## 2020-02-26 DIAGNOSIS — Z20822 Contact with and (suspected) exposure to covid-19: Secondary | ICD-10-CM | POA: Diagnosis not present

## 2020-02-26 DIAGNOSIS — M545 Low back pain: Secondary | ICD-10-CM | POA: Diagnosis not present

## 2020-02-26 DIAGNOSIS — W19XXXA Unspecified fall, initial encounter: Secondary | ICD-10-CM | POA: Diagnosis not present

## 2020-02-26 DIAGNOSIS — Z794 Long term (current) use of insulin: Secondary | ICD-10-CM | POA: Diagnosis not present

## 2020-02-26 DIAGNOSIS — S3991XA Unspecified injury of abdomen, initial encounter: Secondary | ICD-10-CM | POA: Diagnosis not present

## 2020-02-27 DIAGNOSIS — I480 Paroxysmal atrial fibrillation: Secondary | ICD-10-CM | POA: Diagnosis not present

## 2020-02-27 DIAGNOSIS — E785 Hyperlipidemia, unspecified: Secondary | ICD-10-CM | POA: Diagnosis not present

## 2020-02-27 DIAGNOSIS — M47817 Spondylosis without myelopathy or radiculopathy, lumbosacral region: Secondary | ICD-10-CM | POA: Diagnosis not present

## 2020-02-27 DIAGNOSIS — I5032 Chronic diastolic (congestive) heart failure: Secondary | ICD-10-CM | POA: Diagnosis not present

## 2020-02-27 DIAGNOSIS — E119 Type 2 diabetes mellitus without complications: Secondary | ICD-10-CM | POA: Diagnosis not present

## 2020-02-28 DIAGNOSIS — M5134 Other intervertebral disc degeneration, thoracic region: Secondary | ICD-10-CM | POA: Diagnosis not present

## 2020-02-28 DIAGNOSIS — M545 Low back pain: Secondary | ICD-10-CM | POA: Diagnosis not present

## 2020-03-06 DIAGNOSIS — E119 Type 2 diabetes mellitus without complications: Secondary | ICD-10-CM | POA: Diagnosis not present

## 2020-03-06 DIAGNOSIS — M47817 Spondylosis without myelopathy or radiculopathy, lumbosacral region: Secondary | ICD-10-CM | POA: Diagnosis not present

## 2020-03-06 DIAGNOSIS — I5032 Chronic diastolic (congestive) heart failure: Secondary | ICD-10-CM | POA: Diagnosis not present

## 2020-03-06 DIAGNOSIS — I480 Paroxysmal atrial fibrillation: Secondary | ICD-10-CM | POA: Diagnosis not present

## 2020-03-06 DIAGNOSIS — E785 Hyperlipidemia, unspecified: Secondary | ICD-10-CM | POA: Diagnosis not present

## 2020-03-07 DIAGNOSIS — E785 Hyperlipidemia, unspecified: Secondary | ICD-10-CM | POA: Diagnosis not present

## 2020-03-07 DIAGNOSIS — M47817 Spondylosis without myelopathy or radiculopathy, lumbosacral region: Secondary | ICD-10-CM | POA: Diagnosis not present

## 2020-03-07 DIAGNOSIS — I480 Paroxysmal atrial fibrillation: Secondary | ICD-10-CM | POA: Diagnosis not present

## 2020-03-07 DIAGNOSIS — E119 Type 2 diabetes mellitus without complications: Secondary | ICD-10-CM | POA: Diagnosis not present

## 2020-03-07 DIAGNOSIS — I5032 Chronic diastolic (congestive) heart failure: Secondary | ICD-10-CM | POA: Diagnosis not present

## 2020-03-09 DIAGNOSIS — E785 Hyperlipidemia, unspecified: Secondary | ICD-10-CM | POA: Diagnosis not present

## 2020-03-09 DIAGNOSIS — M5430 Sciatica, unspecified side: Secondary | ICD-10-CM | POA: Diagnosis not present

## 2020-03-09 DIAGNOSIS — E119 Type 2 diabetes mellitus without complications: Secondary | ICD-10-CM | POA: Diagnosis not present

## 2020-03-09 DIAGNOSIS — N390 Urinary tract infection, site not specified: Secondary | ICD-10-CM | POA: Diagnosis not present

## 2020-03-09 DIAGNOSIS — K565 Intestinal adhesions [bands], unspecified as to partial versus complete obstruction: Secondary | ICD-10-CM | POA: Diagnosis not present

## 2020-03-09 DIAGNOSIS — I4891 Unspecified atrial fibrillation: Secondary | ICD-10-CM | POA: Diagnosis not present

## 2020-03-09 DIAGNOSIS — I509 Heart failure, unspecified: Secondary | ICD-10-CM | POA: Diagnosis not present

## 2020-03-09 DIAGNOSIS — E039 Hypothyroidism, unspecified: Secondary | ICD-10-CM | POA: Diagnosis not present

## 2020-03-09 DIAGNOSIS — J449 Chronic obstructive pulmonary disease, unspecified: Secondary | ICD-10-CM | POA: Diagnosis not present

## 2020-03-10 DIAGNOSIS — E039 Hypothyroidism, unspecified: Secondary | ICD-10-CM | POA: Diagnosis not present

## 2020-03-10 DIAGNOSIS — J449 Chronic obstructive pulmonary disease, unspecified: Secondary | ICD-10-CM | POA: Diagnosis not present

## 2020-03-10 DIAGNOSIS — I509 Heart failure, unspecified: Secondary | ICD-10-CM | POA: Diagnosis not present

## 2020-03-10 DIAGNOSIS — K565 Intestinal adhesions [bands], unspecified as to partial versus complete obstruction: Secondary | ICD-10-CM | POA: Diagnosis not present

## 2020-03-10 DIAGNOSIS — N390 Urinary tract infection, site not specified: Secondary | ICD-10-CM | POA: Diagnosis not present

## 2020-03-10 DIAGNOSIS — E785 Hyperlipidemia, unspecified: Secondary | ICD-10-CM | POA: Diagnosis not present

## 2020-03-10 DIAGNOSIS — I4891 Unspecified atrial fibrillation: Secondary | ICD-10-CM | POA: Diagnosis not present

## 2020-03-10 DIAGNOSIS — E119 Type 2 diabetes mellitus without complications: Secondary | ICD-10-CM | POA: Diagnosis not present

## 2020-03-10 DIAGNOSIS — M5430 Sciatica, unspecified side: Secondary | ICD-10-CM | POA: Diagnosis not present

## 2020-03-11 DIAGNOSIS — I4891 Unspecified atrial fibrillation: Secondary | ICD-10-CM | POA: Diagnosis not present

## 2020-03-11 DIAGNOSIS — E039 Hypothyroidism, unspecified: Secondary | ICD-10-CM | POA: Diagnosis not present

## 2020-03-11 DIAGNOSIS — I509 Heart failure, unspecified: Secondary | ICD-10-CM | POA: Diagnosis not present

## 2020-03-11 DIAGNOSIS — E785 Hyperlipidemia, unspecified: Secondary | ICD-10-CM | POA: Diagnosis not present

## 2020-03-11 DIAGNOSIS — K565 Intestinal adhesions [bands], unspecified as to partial versus complete obstruction: Secondary | ICD-10-CM | POA: Diagnosis not present

## 2020-03-11 DIAGNOSIS — E119 Type 2 diabetes mellitus without complications: Secondary | ICD-10-CM | POA: Diagnosis not present

## 2020-03-11 DIAGNOSIS — N390 Urinary tract infection, site not specified: Secondary | ICD-10-CM | POA: Diagnosis not present

## 2020-03-11 DIAGNOSIS — M5430 Sciatica, unspecified side: Secondary | ICD-10-CM | POA: Diagnosis not present

## 2020-03-11 DIAGNOSIS — J449 Chronic obstructive pulmonary disease, unspecified: Secondary | ICD-10-CM | POA: Diagnosis not present

## 2020-03-12 ENCOUNTER — Other Ambulatory Visit: Payer: Self-pay | Admitting: *Deleted

## 2020-03-12 DIAGNOSIS — M5430 Sciatica, unspecified side: Secondary | ICD-10-CM | POA: Diagnosis not present

## 2020-03-12 DIAGNOSIS — N390 Urinary tract infection, site not specified: Secondary | ICD-10-CM | POA: Diagnosis not present

## 2020-03-12 DIAGNOSIS — E119 Type 2 diabetes mellitus without complications: Secondary | ICD-10-CM | POA: Diagnosis not present

## 2020-03-12 DIAGNOSIS — E785 Hyperlipidemia, unspecified: Secondary | ICD-10-CM | POA: Diagnosis not present

## 2020-03-12 DIAGNOSIS — J449 Chronic obstructive pulmonary disease, unspecified: Secondary | ICD-10-CM | POA: Diagnosis not present

## 2020-03-12 DIAGNOSIS — K565 Intestinal adhesions [bands], unspecified as to partial versus complete obstruction: Secondary | ICD-10-CM | POA: Diagnosis not present

## 2020-03-12 DIAGNOSIS — I509 Heart failure, unspecified: Secondary | ICD-10-CM | POA: Diagnosis not present

## 2020-03-12 DIAGNOSIS — E039 Hypothyroidism, unspecified: Secondary | ICD-10-CM | POA: Diagnosis not present

## 2020-03-12 DIAGNOSIS — I4891 Unspecified atrial fibrillation: Secondary | ICD-10-CM | POA: Diagnosis not present

## 2020-03-12 NOTE — Patient Outreach (Signed)
[facility]) Care Management  03/12/2020  JUAQUINA MACHNIK 1925/05/11 [id number]  Referral received : 03/12/20 Referral source : Notification of discharge from [facility] on 03/11/20 Insurance : [facility]    Referral received. Transition of care calls being completed via EMMI-automated calls. RN CM will outreach patient for any red flags received.   Case Closure Reason: enrolled in another program.   Egbert Garibaldi, RN, BSN  [facility] Care Management,Care Management Coordinator  [phone]- Mobile [phone]- Toll Free Main Office

## 2020-03-13 DIAGNOSIS — Z6822 Body mass index (BMI) 22.0-22.9, adult: Secondary | ICD-10-CM | POA: Diagnosis not present

## 2020-03-13 DIAGNOSIS — E119 Type 2 diabetes mellitus without complications: Secondary | ICD-10-CM | POA: Diagnosis not present

## 2020-03-13 DIAGNOSIS — E785 Hyperlipidemia, unspecified: Secondary | ICD-10-CM | POA: Diagnosis not present

## 2020-03-13 DIAGNOSIS — I509 Heart failure, unspecified: Secondary | ICD-10-CM | POA: Diagnosis not present

## 2020-03-13 DIAGNOSIS — M545 Low back pain: Secondary | ICD-10-CM | POA: Diagnosis not present

## 2020-03-13 DIAGNOSIS — E039 Hypothyroidism, unspecified: Secondary | ICD-10-CM | POA: Diagnosis not present

## 2020-03-13 DIAGNOSIS — J449 Chronic obstructive pulmonary disease, unspecified: Secondary | ICD-10-CM | POA: Diagnosis not present

## 2020-03-13 DIAGNOSIS — M5430 Sciatica, unspecified side: Secondary | ICD-10-CM | POA: Diagnosis not present

## 2020-03-13 DIAGNOSIS — K565 Intestinal adhesions [bands], unspecified as to partial versus complete obstruction: Secondary | ICD-10-CM | POA: Diagnosis not present

## 2020-03-13 DIAGNOSIS — N390 Urinary tract infection, site not specified: Secondary | ICD-10-CM | POA: Diagnosis not present

## 2020-03-13 DIAGNOSIS — I4891 Unspecified atrial fibrillation: Secondary | ICD-10-CM | POA: Diagnosis not present

## 2020-03-14 DIAGNOSIS — K565 Intestinal adhesions [bands], unspecified as to partial versus complete obstruction: Secondary | ICD-10-CM | POA: Diagnosis not present

## 2020-03-14 DIAGNOSIS — E785 Hyperlipidemia, unspecified: Secondary | ICD-10-CM | POA: Diagnosis not present

## 2020-03-14 DIAGNOSIS — N390 Urinary tract infection, site not specified: Secondary | ICD-10-CM | POA: Diagnosis not present

## 2020-03-14 DIAGNOSIS — E119 Type 2 diabetes mellitus without complications: Secondary | ICD-10-CM | POA: Diagnosis not present

## 2020-03-14 DIAGNOSIS — E039 Hypothyroidism, unspecified: Secondary | ICD-10-CM | POA: Diagnosis not present

## 2020-03-14 DIAGNOSIS — I509 Heart failure, unspecified: Secondary | ICD-10-CM | POA: Diagnosis not present

## 2020-03-14 DIAGNOSIS — M5430 Sciatica, unspecified side: Secondary | ICD-10-CM | POA: Diagnosis not present

## 2020-03-14 DIAGNOSIS — J449 Chronic obstructive pulmonary disease, unspecified: Secondary | ICD-10-CM | POA: Diagnosis not present

## 2020-03-14 DIAGNOSIS — I4891 Unspecified atrial fibrillation: Secondary | ICD-10-CM | POA: Diagnosis not present

## 2020-03-18 DIAGNOSIS — E119 Type 2 diabetes mellitus without complications: Secondary | ICD-10-CM | POA: Diagnosis not present

## 2020-03-18 DIAGNOSIS — I4891 Unspecified atrial fibrillation: Secondary | ICD-10-CM | POA: Diagnosis not present

## 2020-03-18 DIAGNOSIS — N390 Urinary tract infection, site not specified: Secondary | ICD-10-CM | POA: Diagnosis not present

## 2020-03-18 DIAGNOSIS — I509 Heart failure, unspecified: Secondary | ICD-10-CM | POA: Diagnosis not present

## 2020-03-18 DIAGNOSIS — M5430 Sciatica, unspecified side: Secondary | ICD-10-CM | POA: Diagnosis not present

## 2020-03-18 DIAGNOSIS — E039 Hypothyroidism, unspecified: Secondary | ICD-10-CM | POA: Diagnosis not present

## 2020-03-18 DIAGNOSIS — K565 Intestinal adhesions [bands], unspecified as to partial versus complete obstruction: Secondary | ICD-10-CM | POA: Diagnosis not present

## 2020-03-18 DIAGNOSIS — E785 Hyperlipidemia, unspecified: Secondary | ICD-10-CM | POA: Diagnosis not present

## 2020-03-18 DIAGNOSIS — J449 Chronic obstructive pulmonary disease, unspecified: Secondary | ICD-10-CM | POA: Diagnosis not present

## 2020-03-20 DIAGNOSIS — M5430 Sciatica, unspecified side: Secondary | ICD-10-CM | POA: Diagnosis not present

## 2020-03-20 DIAGNOSIS — N390 Urinary tract infection, site not specified: Secondary | ICD-10-CM | POA: Diagnosis not present

## 2020-03-20 DIAGNOSIS — I4891 Unspecified atrial fibrillation: Secondary | ICD-10-CM | POA: Diagnosis not present

## 2020-03-20 DIAGNOSIS — E785 Hyperlipidemia, unspecified: Secondary | ICD-10-CM | POA: Diagnosis not present

## 2020-03-20 DIAGNOSIS — E119 Type 2 diabetes mellitus without complications: Secondary | ICD-10-CM | POA: Diagnosis not present

## 2020-03-20 DIAGNOSIS — J449 Chronic obstructive pulmonary disease, unspecified: Secondary | ICD-10-CM | POA: Diagnosis not present

## 2020-03-20 DIAGNOSIS — I509 Heart failure, unspecified: Secondary | ICD-10-CM | POA: Diagnosis not present

## 2020-03-20 DIAGNOSIS — E039 Hypothyroidism, unspecified: Secondary | ICD-10-CM | POA: Diagnosis not present

## 2020-03-20 DIAGNOSIS — K565 Intestinal adhesions [bands], unspecified as to partial versus complete obstruction: Secondary | ICD-10-CM | POA: Diagnosis not present

## 2020-03-25 DIAGNOSIS — K565 Intestinal adhesions [bands], unspecified as to partial versus complete obstruction: Secondary | ICD-10-CM | POA: Diagnosis not present

## 2020-03-25 DIAGNOSIS — E785 Hyperlipidemia, unspecified: Secondary | ICD-10-CM | POA: Diagnosis not present

## 2020-03-25 DIAGNOSIS — I509 Heart failure, unspecified: Secondary | ICD-10-CM | POA: Diagnosis not present

## 2020-03-25 DIAGNOSIS — M5430 Sciatica, unspecified side: Secondary | ICD-10-CM | POA: Diagnosis not present

## 2020-03-25 DIAGNOSIS — E039 Hypothyroidism, unspecified: Secondary | ICD-10-CM | POA: Diagnosis not present

## 2020-03-25 DIAGNOSIS — N390 Urinary tract infection, site not specified: Secondary | ICD-10-CM | POA: Diagnosis not present

## 2020-03-25 DIAGNOSIS — E119 Type 2 diabetes mellitus without complications: Secondary | ICD-10-CM | POA: Diagnosis not present

## 2020-03-25 DIAGNOSIS — J449 Chronic obstructive pulmonary disease, unspecified: Secondary | ICD-10-CM | POA: Diagnosis not present

## 2020-03-25 DIAGNOSIS — I4891 Unspecified atrial fibrillation: Secondary | ICD-10-CM | POA: Diagnosis not present

## 2020-03-28 DIAGNOSIS — I4891 Unspecified atrial fibrillation: Secondary | ICD-10-CM | POA: Diagnosis not present

## 2020-03-28 DIAGNOSIS — N390 Urinary tract infection, site not specified: Secondary | ICD-10-CM | POA: Diagnosis not present

## 2020-03-28 DIAGNOSIS — K565 Intestinal adhesions [bands], unspecified as to partial versus complete obstruction: Secondary | ICD-10-CM | POA: Diagnosis not present

## 2020-03-28 DIAGNOSIS — J449 Chronic obstructive pulmonary disease, unspecified: Secondary | ICD-10-CM | POA: Diagnosis not present

## 2020-03-28 DIAGNOSIS — E039 Hypothyroidism, unspecified: Secondary | ICD-10-CM | POA: Diagnosis not present

## 2020-03-28 DIAGNOSIS — E785 Hyperlipidemia, unspecified: Secondary | ICD-10-CM | POA: Diagnosis not present

## 2020-03-28 DIAGNOSIS — I509 Heart failure, unspecified: Secondary | ICD-10-CM | POA: Diagnosis not present

## 2020-03-28 DIAGNOSIS — E119 Type 2 diabetes mellitus without complications: Secondary | ICD-10-CM | POA: Diagnosis not present

## 2020-03-28 DIAGNOSIS — M5430 Sciatica, unspecified side: Secondary | ICD-10-CM | POA: Diagnosis not present

## 2020-05-20 DIAGNOSIS — E119 Type 2 diabetes mellitus without complications: Secondary | ICD-10-CM | POA: Diagnosis not present

## 2020-05-20 DIAGNOSIS — M545 Low back pain: Secondary | ICD-10-CM | POA: Diagnosis not present

## 2020-05-20 DIAGNOSIS — E7849 Other hyperlipidemia: Secondary | ICD-10-CM | POA: Diagnosis not present

## 2020-05-20 DIAGNOSIS — I10 Essential (primary) hypertension: Secondary | ICD-10-CM | POA: Diagnosis not present

## 2020-05-20 DIAGNOSIS — Z6823 Body mass index (BMI) 23.0-23.9, adult: Secondary | ICD-10-CM | POA: Diagnosis not present

## 2020-05-20 DIAGNOSIS — F411 Generalized anxiety disorder: Secondary | ICD-10-CM | POA: Diagnosis not present

## 2020-05-20 DIAGNOSIS — J449 Chronic obstructive pulmonary disease, unspecified: Secondary | ICD-10-CM | POA: Diagnosis not present

## 2020-05-27 DIAGNOSIS — R404 Transient alteration of awareness: Secondary | ICD-10-CM | POA: Diagnosis not present

## 2020-05-27 DIAGNOSIS — I959 Hypotension, unspecified: Secondary | ICD-10-CM | POA: Diagnosis not present

## 2020-05-27 DIAGNOSIS — E161 Other hypoglycemia: Secondary | ICD-10-CM | POA: Diagnosis not present

## 2020-05-27 DIAGNOSIS — R4020 Unspecified coma: Secondary | ICD-10-CM | POA: Diagnosis not present

## 2020-05-27 DIAGNOSIS — E162 Hypoglycemia, unspecified: Secondary | ICD-10-CM | POA: Diagnosis not present

## 2020-06-09 DIAGNOSIS — M6281 Muscle weakness (generalized): Secondary | ICD-10-CM | POA: Diagnosis not present

## 2020-06-09 DIAGNOSIS — Z882 Allergy status to sulfonamides status: Secondary | ICD-10-CM | POA: Diagnosis not present

## 2020-06-09 DIAGNOSIS — I4891 Unspecified atrial fibrillation: Secondary | ICD-10-CM | POA: Diagnosis not present

## 2020-06-09 DIAGNOSIS — M545 Low back pain: Secondary | ICD-10-CM | POA: Diagnosis not present

## 2020-06-09 DIAGNOSIS — M5489 Other dorsalgia: Secondary | ICD-10-CM | POA: Diagnosis not present

## 2020-06-09 DIAGNOSIS — R41841 Cognitive communication deficit: Secondary | ICD-10-CM | POA: Diagnosis not present

## 2020-06-09 DIAGNOSIS — R410 Disorientation, unspecified: Secondary | ICD-10-CM | POA: Diagnosis not present

## 2020-06-09 DIAGNOSIS — E119 Type 2 diabetes mellitus without complications: Secondary | ICD-10-CM | POA: Diagnosis not present

## 2020-06-09 DIAGNOSIS — R2689 Other abnormalities of gait and mobility: Secondary | ICD-10-CM | POA: Diagnosis not present

## 2020-06-09 DIAGNOSIS — R52 Pain, unspecified: Secondary | ICD-10-CM | POA: Diagnosis not present

## 2020-06-09 DIAGNOSIS — R5381 Other malaise: Secondary | ICD-10-CM | POA: Diagnosis not present

## 2020-06-09 DIAGNOSIS — R531 Weakness: Secondary | ICD-10-CM | POA: Diagnosis not present

## 2020-06-09 DIAGNOSIS — E162 Hypoglycemia, unspecified: Secondary | ICD-10-CM | POA: Diagnosis not present

## 2020-06-09 DIAGNOSIS — E11649 Type 2 diabetes mellitus with hypoglycemia without coma: Secondary | ICD-10-CM | POA: Diagnosis not present

## 2020-06-09 DIAGNOSIS — G8929 Other chronic pain: Secondary | ICD-10-CM | POA: Diagnosis not present

## 2020-06-09 DIAGNOSIS — J811 Chronic pulmonary edema: Secondary | ICD-10-CM | POA: Diagnosis not present

## 2020-06-09 DIAGNOSIS — B952 Enterococcus as the cause of diseases classified elsewhere: Secondary | ICD-10-CM | POA: Diagnosis not present

## 2020-06-09 DIAGNOSIS — F05 Delirium due to known physiological condition: Secondary | ICD-10-CM | POA: Diagnosis not present

## 2020-06-09 DIAGNOSIS — E039 Hypothyroidism, unspecified: Secondary | ICD-10-CM | POA: Diagnosis not present

## 2020-06-09 DIAGNOSIS — N390 Urinary tract infection, site not specified: Secondary | ICD-10-CM | POA: Diagnosis not present

## 2020-06-09 DIAGNOSIS — J449 Chronic obstructive pulmonary disease, unspecified: Secondary | ICD-10-CM | POA: Diagnosis not present

## 2020-06-09 DIAGNOSIS — K5900 Constipation, unspecified: Secondary | ICD-10-CM | POA: Diagnosis not present

## 2020-06-09 DIAGNOSIS — Z20822 Contact with and (suspected) exposure to covid-19: Secondary | ICD-10-CM | POA: Diagnosis not present

## 2020-06-09 DIAGNOSIS — N3090 Cystitis, unspecified without hematuria: Secondary | ICD-10-CM | POA: Diagnosis not present

## 2020-06-09 DIAGNOSIS — Z7401 Bed confinement status: Secondary | ICD-10-CM | POA: Diagnosis not present

## 2020-06-09 DIAGNOSIS — I509 Heart failure, unspecified: Secondary | ICD-10-CM | POA: Diagnosis not present

## 2020-06-09 DIAGNOSIS — Z794 Long term (current) use of insulin: Secondary | ICD-10-CM | POA: Diagnosis not present

## 2020-06-09 DIAGNOSIS — M109 Gout, unspecified: Secondary | ICD-10-CM | POA: Diagnosis not present

## 2020-06-09 DIAGNOSIS — R296 Repeated falls: Secondary | ICD-10-CM | POA: Diagnosis not present

## 2020-06-09 DIAGNOSIS — B961 Klebsiella pneumoniae [K. pneumoniae] as the cause of diseases classified elsewhere: Secondary | ICD-10-CM | POA: Diagnosis not present

## 2020-06-12 DIAGNOSIS — Z7401 Bed confinement status: Secondary | ICD-10-CM | POA: Diagnosis not present

## 2020-06-12 DIAGNOSIS — B961 Klebsiella pneumoniae [K. pneumoniae] as the cause of diseases classified elsewhere: Secondary | ICD-10-CM | POA: Diagnosis not present

## 2020-06-12 DIAGNOSIS — R41841 Cognitive communication deficit: Secondary | ICD-10-CM | POA: Diagnosis not present

## 2020-06-12 DIAGNOSIS — G8929 Other chronic pain: Secondary | ICD-10-CM | POA: Diagnosis not present

## 2020-06-12 DIAGNOSIS — E162 Hypoglycemia, unspecified: Secondary | ICD-10-CM | POA: Diagnosis not present

## 2020-06-12 DIAGNOSIS — R52 Pain, unspecified: Secondary | ICD-10-CM | POA: Diagnosis not present

## 2020-06-12 DIAGNOSIS — I509 Heart failure, unspecified: Secondary | ICD-10-CM | POA: Diagnosis not present

## 2020-06-12 DIAGNOSIS — R410 Disorientation, unspecified: Secondary | ICD-10-CM | POA: Diagnosis not present

## 2020-06-12 DIAGNOSIS — M6281 Muscle weakness (generalized): Secondary | ICD-10-CM | POA: Diagnosis not present

## 2020-06-12 DIAGNOSIS — F05 Delirium due to known physiological condition: Secondary | ICD-10-CM | POA: Diagnosis not present

## 2020-06-12 DIAGNOSIS — I4891 Unspecified atrial fibrillation: Secondary | ICD-10-CM | POA: Diagnosis not present

## 2020-06-12 DIAGNOSIS — R296 Repeated falls: Secondary | ICD-10-CM | POA: Diagnosis not present

## 2020-06-12 DIAGNOSIS — R2689 Other abnormalities of gait and mobility: Secondary | ICD-10-CM | POA: Diagnosis not present

## 2020-06-12 DIAGNOSIS — N390 Urinary tract infection, site not specified: Secondary | ICD-10-CM | POA: Diagnosis not present

## 2020-06-12 DIAGNOSIS — E11649 Type 2 diabetes mellitus with hypoglycemia without coma: Secondary | ICD-10-CM | POA: Diagnosis not present

## 2020-06-12 DIAGNOSIS — M5489 Other dorsalgia: Secondary | ICD-10-CM | POA: Diagnosis not present

## 2020-06-12 DIAGNOSIS — I5032 Chronic diastolic (congestive) heart failure: Secondary | ICD-10-CM | POA: Diagnosis not present

## 2020-06-12 DIAGNOSIS — Z882 Allergy status to sulfonamides status: Secondary | ICD-10-CM | POA: Diagnosis not present

## 2020-06-12 DIAGNOSIS — Z794 Long term (current) use of insulin: Secondary | ICD-10-CM | POA: Diagnosis not present

## 2020-06-12 DIAGNOSIS — J449 Chronic obstructive pulmonary disease, unspecified: Secondary | ICD-10-CM | POA: Diagnosis not present

## 2020-06-12 DIAGNOSIS — B952 Enterococcus as the cause of diseases classified elsewhere: Secondary | ICD-10-CM | POA: Diagnosis not present

## 2020-06-12 DIAGNOSIS — R5381 Other malaise: Secondary | ICD-10-CM | POA: Diagnosis not present

## 2020-06-12 DIAGNOSIS — M545 Low back pain: Secondary | ICD-10-CM | POA: Diagnosis not present

## 2020-06-13 DIAGNOSIS — I4891 Unspecified atrial fibrillation: Secondary | ICD-10-CM | POA: Diagnosis not present

## 2020-06-13 DIAGNOSIS — J449 Chronic obstructive pulmonary disease, unspecified: Secondary | ICD-10-CM | POA: Diagnosis not present

## 2020-06-13 DIAGNOSIS — I5032 Chronic diastolic (congestive) heart failure: Secondary | ICD-10-CM | POA: Diagnosis not present

## 2020-06-13 DIAGNOSIS — E162 Hypoglycemia, unspecified: Secondary | ICD-10-CM | POA: Diagnosis not present

## 2020-07-01 ENCOUNTER — Other Ambulatory Visit: Payer: Self-pay | Admitting: *Deleted

## 2020-07-01 NOTE — Patient Outreach (Signed)
[facility]) Care Management  07/01/2020  [name] April 18, 1925 [id number]   Transition of Care   Referral Date : 07/01/20 Referral source: [facility] Discharge Notification  Date of Admission : 06/12/20 Diagnosis: UTI, Weakness , falls  Date of  Discharge: 06/30/20 Facility : [facility]  Insurance: [facility] [employer] [facility]  Per Electronic record inpatient admission at [facility] 6/21-6/24/21.  Referral received. Transition of care calls being completed via EMMI-automated calls. RN CM will outreach patient for any red flags received.   Case Closure Reason: enrolled in another program.   Egbert Garibaldi, RN, BSN  [facility] Care Management,Care Management Coordinator  [phone]- Mobile [phone]- Toll Free Main Office

## 2020-07-04 DIAGNOSIS — J449 Chronic obstructive pulmonary disease, unspecified: Secondary | ICD-10-CM | POA: Diagnosis not present

## 2020-07-04 DIAGNOSIS — N390 Urinary tract infection, site not specified: Secondary | ICD-10-CM | POA: Diagnosis not present

## 2020-07-04 DIAGNOSIS — E11649 Type 2 diabetes mellitus with hypoglycemia without coma: Secondary | ICD-10-CM | POA: Diagnosis not present

## 2020-07-04 DIAGNOSIS — I4891 Unspecified atrial fibrillation: Secondary | ICD-10-CM | POA: Diagnosis not present

## 2020-07-04 DIAGNOSIS — I5030 Unspecified diastolic (congestive) heart failure: Secondary | ICD-10-CM | POA: Diagnosis not present

## 2020-07-04 DIAGNOSIS — G8929 Other chronic pain: Secondary | ICD-10-CM | POA: Diagnosis not present

## 2020-07-04 DIAGNOSIS — E039 Hypothyroidism, unspecified: Secondary | ICD-10-CM | POA: Diagnosis not present

## 2020-07-04 DIAGNOSIS — M545 Low back pain: Secondary | ICD-10-CM | POA: Diagnosis not present

## 2020-07-04 DIAGNOSIS — F411 Generalized anxiety disorder: Secondary | ICD-10-CM | POA: Diagnosis not present

## 2020-07-07 DIAGNOSIS — F411 Generalized anxiety disorder: Secondary | ICD-10-CM | POA: Diagnosis not present

## 2020-07-07 DIAGNOSIS — J449 Chronic obstructive pulmonary disease, unspecified: Secondary | ICD-10-CM | POA: Diagnosis not present

## 2020-07-07 DIAGNOSIS — E039 Hypothyroidism, unspecified: Secondary | ICD-10-CM | POA: Diagnosis not present

## 2020-07-07 DIAGNOSIS — N390 Urinary tract infection, site not specified: Secondary | ICD-10-CM | POA: Diagnosis not present

## 2020-07-07 DIAGNOSIS — I5030 Unspecified diastolic (congestive) heart failure: Secondary | ICD-10-CM | POA: Diagnosis not present

## 2020-07-07 DIAGNOSIS — G8929 Other chronic pain: Secondary | ICD-10-CM | POA: Diagnosis not present

## 2020-07-07 DIAGNOSIS — M545 Low back pain: Secondary | ICD-10-CM | POA: Diagnosis not present

## 2020-07-07 DIAGNOSIS — I4891 Unspecified atrial fibrillation: Secondary | ICD-10-CM | POA: Diagnosis not present

## 2020-07-07 DIAGNOSIS — E11649 Type 2 diabetes mellitus with hypoglycemia without coma: Secondary | ICD-10-CM | POA: Diagnosis not present

## 2020-07-08 DIAGNOSIS — J449 Chronic obstructive pulmonary disease, unspecified: Secondary | ICD-10-CM | POA: Diagnosis not present

## 2020-07-08 DIAGNOSIS — E11649 Type 2 diabetes mellitus with hypoglycemia without coma: Secondary | ICD-10-CM | POA: Diagnosis not present

## 2020-07-08 DIAGNOSIS — N390 Urinary tract infection, site not specified: Secondary | ICD-10-CM | POA: Diagnosis not present

## 2020-07-08 DIAGNOSIS — M545 Low back pain: Secondary | ICD-10-CM | POA: Diagnosis not present

## 2020-07-08 DIAGNOSIS — G8929 Other chronic pain: Secondary | ICD-10-CM | POA: Diagnosis not present

## 2020-07-08 DIAGNOSIS — E039 Hypothyroidism, unspecified: Secondary | ICD-10-CM | POA: Diagnosis not present

## 2020-07-08 DIAGNOSIS — I5030 Unspecified diastolic (congestive) heart failure: Secondary | ICD-10-CM | POA: Diagnosis not present

## 2020-07-08 DIAGNOSIS — F411 Generalized anxiety disorder: Secondary | ICD-10-CM | POA: Diagnosis not present

## 2020-07-08 DIAGNOSIS — I4891 Unspecified atrial fibrillation: Secondary | ICD-10-CM | POA: Diagnosis not present

## 2020-07-10 DIAGNOSIS — L570 Actinic keratosis: Secondary | ICD-10-CM | POA: Diagnosis not present

## 2020-07-10 DIAGNOSIS — Z6822 Body mass index (BMI) 22.0-22.9, adult: Secondary | ICD-10-CM | POA: Diagnosis not present

## 2020-07-10 DIAGNOSIS — M545 Low back pain: Secondary | ICD-10-CM | POA: Diagnosis not present

## 2020-07-10 DIAGNOSIS — J449 Chronic obstructive pulmonary disease, unspecified: Secondary | ICD-10-CM | POA: Diagnosis not present

## 2020-07-10 DIAGNOSIS — E119 Type 2 diabetes mellitus without complications: Secondary | ICD-10-CM | POA: Diagnosis not present

## 2020-07-10 DIAGNOSIS — E7849 Other hyperlipidemia: Secondary | ICD-10-CM | POA: Diagnosis not present

## 2020-07-10 DIAGNOSIS — I10 Essential (primary) hypertension: Secondary | ICD-10-CM | POA: Diagnosis not present

## 2020-07-10 DIAGNOSIS — F411 Generalized anxiety disorder: Secondary | ICD-10-CM | POA: Diagnosis not present

## 2020-07-11 DIAGNOSIS — E039 Hypothyroidism, unspecified: Secondary | ICD-10-CM | POA: Diagnosis not present

## 2020-07-11 DIAGNOSIS — M545 Low back pain: Secondary | ICD-10-CM | POA: Diagnosis not present

## 2020-07-11 DIAGNOSIS — J449 Chronic obstructive pulmonary disease, unspecified: Secondary | ICD-10-CM | POA: Diagnosis not present

## 2020-07-11 DIAGNOSIS — G8929 Other chronic pain: Secondary | ICD-10-CM | POA: Diagnosis not present

## 2020-07-11 DIAGNOSIS — I5030 Unspecified diastolic (congestive) heart failure: Secondary | ICD-10-CM | POA: Diagnosis not present

## 2020-07-11 DIAGNOSIS — F411 Generalized anxiety disorder: Secondary | ICD-10-CM | POA: Diagnosis not present

## 2020-07-11 DIAGNOSIS — N390 Urinary tract infection, site not specified: Secondary | ICD-10-CM | POA: Diagnosis not present

## 2020-07-11 DIAGNOSIS — I4891 Unspecified atrial fibrillation: Secondary | ICD-10-CM | POA: Diagnosis not present

## 2020-07-11 DIAGNOSIS — E11649 Type 2 diabetes mellitus with hypoglycemia without coma: Secondary | ICD-10-CM | POA: Diagnosis not present

## 2020-07-15 DIAGNOSIS — M545 Low back pain: Secondary | ICD-10-CM | POA: Diagnosis not present

## 2020-07-15 DIAGNOSIS — G8929 Other chronic pain: Secondary | ICD-10-CM | POA: Diagnosis not present

## 2020-07-15 DIAGNOSIS — I4891 Unspecified atrial fibrillation: Secondary | ICD-10-CM | POA: Diagnosis not present

## 2020-07-15 DIAGNOSIS — E11649 Type 2 diabetes mellitus with hypoglycemia without coma: Secondary | ICD-10-CM | POA: Diagnosis not present

## 2020-07-15 DIAGNOSIS — E039 Hypothyroidism, unspecified: Secondary | ICD-10-CM | POA: Diagnosis not present

## 2020-07-15 DIAGNOSIS — J449 Chronic obstructive pulmonary disease, unspecified: Secondary | ICD-10-CM | POA: Diagnosis not present

## 2020-07-15 DIAGNOSIS — N390 Urinary tract infection, site not specified: Secondary | ICD-10-CM | POA: Diagnosis not present

## 2020-07-15 DIAGNOSIS — F411 Generalized anxiety disorder: Secondary | ICD-10-CM | POA: Diagnosis not present

## 2020-07-15 DIAGNOSIS — I5030 Unspecified diastolic (congestive) heart failure: Secondary | ICD-10-CM | POA: Diagnosis not present

## 2020-07-22 DIAGNOSIS — M545 Low back pain: Secondary | ICD-10-CM | POA: Diagnosis not present

## 2020-07-22 DIAGNOSIS — J449 Chronic obstructive pulmonary disease, unspecified: Secondary | ICD-10-CM | POA: Diagnosis not present

## 2020-07-22 DIAGNOSIS — N390 Urinary tract infection, site not specified: Secondary | ICD-10-CM | POA: Diagnosis not present

## 2020-07-22 DIAGNOSIS — E039 Hypothyroidism, unspecified: Secondary | ICD-10-CM | POA: Diagnosis not present

## 2020-07-22 DIAGNOSIS — E11649 Type 2 diabetes mellitus with hypoglycemia without coma: Secondary | ICD-10-CM | POA: Diagnosis not present

## 2020-07-22 DIAGNOSIS — I4891 Unspecified atrial fibrillation: Secondary | ICD-10-CM | POA: Diagnosis not present

## 2020-07-22 DIAGNOSIS — I5030 Unspecified diastolic (congestive) heart failure: Secondary | ICD-10-CM | POA: Diagnosis not present

## 2020-07-22 DIAGNOSIS — G8929 Other chronic pain: Secondary | ICD-10-CM | POA: Diagnosis not present

## 2020-07-22 DIAGNOSIS — F411 Generalized anxiety disorder: Secondary | ICD-10-CM | POA: Diagnosis not present

## 2020-07-23 DIAGNOSIS — I4891 Unspecified atrial fibrillation: Secondary | ICD-10-CM | POA: Diagnosis not present

## 2020-07-23 DIAGNOSIS — E039 Hypothyroidism, unspecified: Secondary | ICD-10-CM | POA: Diagnosis not present

## 2020-07-23 DIAGNOSIS — J449 Chronic obstructive pulmonary disease, unspecified: Secondary | ICD-10-CM | POA: Diagnosis not present

## 2020-07-23 DIAGNOSIS — N390 Urinary tract infection, site not specified: Secondary | ICD-10-CM | POA: Diagnosis not present

## 2020-07-23 DIAGNOSIS — E11649 Type 2 diabetes mellitus with hypoglycemia without coma: Secondary | ICD-10-CM | POA: Diagnosis not present

## 2020-07-23 DIAGNOSIS — I5030 Unspecified diastolic (congestive) heart failure: Secondary | ICD-10-CM | POA: Diagnosis not present

## 2020-07-23 DIAGNOSIS — G8929 Other chronic pain: Secondary | ICD-10-CM | POA: Diagnosis not present

## 2020-07-23 DIAGNOSIS — F411 Generalized anxiety disorder: Secondary | ICD-10-CM | POA: Diagnosis not present

## 2020-07-23 DIAGNOSIS — M545 Low back pain: Secondary | ICD-10-CM | POA: Diagnosis not present

## 2020-07-29 DIAGNOSIS — I4891 Unspecified atrial fibrillation: Secondary | ICD-10-CM | POA: Diagnosis not present

## 2020-07-29 DIAGNOSIS — N390 Urinary tract infection, site not specified: Secondary | ICD-10-CM | POA: Diagnosis not present

## 2020-07-29 DIAGNOSIS — I5030 Unspecified diastolic (congestive) heart failure: Secondary | ICD-10-CM | POA: Diagnosis not present

## 2020-07-29 DIAGNOSIS — E039 Hypothyroidism, unspecified: Secondary | ICD-10-CM | POA: Diagnosis not present

## 2020-07-29 DIAGNOSIS — E11649 Type 2 diabetes mellitus with hypoglycemia without coma: Secondary | ICD-10-CM | POA: Diagnosis not present

## 2020-07-29 DIAGNOSIS — F411 Generalized anxiety disorder: Secondary | ICD-10-CM | POA: Diagnosis not present

## 2020-07-29 DIAGNOSIS — G8929 Other chronic pain: Secondary | ICD-10-CM | POA: Diagnosis not present

## 2020-07-29 DIAGNOSIS — J449 Chronic obstructive pulmonary disease, unspecified: Secondary | ICD-10-CM | POA: Diagnosis not present

## 2020-07-29 DIAGNOSIS — M545 Low back pain: Secondary | ICD-10-CM | POA: Diagnosis not present

## 2020-08-03 DIAGNOSIS — J449 Chronic obstructive pulmonary disease, unspecified: Secondary | ICD-10-CM | POA: Diagnosis not present

## 2020-08-03 DIAGNOSIS — I4891 Unspecified atrial fibrillation: Secondary | ICD-10-CM | POA: Diagnosis not present

## 2020-08-03 DIAGNOSIS — F411 Generalized anxiety disorder: Secondary | ICD-10-CM | POA: Diagnosis not present

## 2020-08-03 DIAGNOSIS — E11649 Type 2 diabetes mellitus with hypoglycemia without coma: Secondary | ICD-10-CM | POA: Diagnosis not present

## 2020-08-03 DIAGNOSIS — G8929 Other chronic pain: Secondary | ICD-10-CM | POA: Diagnosis not present

## 2020-08-03 DIAGNOSIS — M545 Low back pain: Secondary | ICD-10-CM | POA: Diagnosis not present

## 2020-08-03 DIAGNOSIS — I5030 Unspecified diastolic (congestive) heart failure: Secondary | ICD-10-CM | POA: Diagnosis not present

## 2020-08-03 DIAGNOSIS — E039 Hypothyroidism, unspecified: Secondary | ICD-10-CM | POA: Diagnosis not present

## 2020-08-03 DIAGNOSIS — N390 Urinary tract infection, site not specified: Secondary | ICD-10-CM | POA: Diagnosis not present

## 2020-08-05 DIAGNOSIS — M545 Low back pain: Secondary | ICD-10-CM | POA: Diagnosis not present

## 2020-08-05 DIAGNOSIS — I4891 Unspecified atrial fibrillation: Secondary | ICD-10-CM | POA: Diagnosis not present

## 2020-08-05 DIAGNOSIS — G8929 Other chronic pain: Secondary | ICD-10-CM | POA: Diagnosis not present

## 2020-08-05 DIAGNOSIS — E039 Hypothyroidism, unspecified: Secondary | ICD-10-CM | POA: Diagnosis not present

## 2020-08-05 DIAGNOSIS — E11649 Type 2 diabetes mellitus with hypoglycemia without coma: Secondary | ICD-10-CM | POA: Diagnosis not present

## 2020-08-05 DIAGNOSIS — I5030 Unspecified diastolic (congestive) heart failure: Secondary | ICD-10-CM | POA: Diagnosis not present

## 2020-08-05 DIAGNOSIS — J449 Chronic obstructive pulmonary disease, unspecified: Secondary | ICD-10-CM | POA: Diagnosis not present

## 2020-08-05 DIAGNOSIS — F411 Generalized anxiety disorder: Secondary | ICD-10-CM | POA: Diagnosis not present

## 2020-08-05 DIAGNOSIS — N390 Urinary tract infection, site not specified: Secondary | ICD-10-CM | POA: Diagnosis not present

## 2020-08-29 DIAGNOSIS — M6281 Muscle weakness (generalized): Secondary | ICD-10-CM | POA: Diagnosis not present

## 2020-08-29 DIAGNOSIS — R278 Other lack of coordination: Secondary | ICD-10-CM | POA: Diagnosis not present

## 2020-08-29 DIAGNOSIS — E119 Type 2 diabetes mellitus without complications: Secondary | ICD-10-CM | POA: Diagnosis not present

## 2020-08-29 DIAGNOSIS — M545 Low back pain: Secondary | ICD-10-CM | POA: Diagnosis not present

## 2020-08-30 DIAGNOSIS — M545 Low back pain: Secondary | ICD-10-CM | POA: Diagnosis not present

## 2020-08-30 DIAGNOSIS — J449 Chronic obstructive pulmonary disease, unspecified: Secondary | ICD-10-CM | POA: Diagnosis not present

## 2020-08-30 DIAGNOSIS — E119 Type 2 diabetes mellitus without complications: Secondary | ICD-10-CM | POA: Diagnosis not present

## 2020-08-30 DIAGNOSIS — E785 Hyperlipidemia, unspecified: Secondary | ICD-10-CM | POA: Diagnosis not present

## 2020-09-01 DIAGNOSIS — R278 Other lack of coordination: Secondary | ICD-10-CM | POA: Diagnosis not present

## 2020-09-01 DIAGNOSIS — E119 Type 2 diabetes mellitus without complications: Secondary | ICD-10-CM | POA: Diagnosis not present

## 2020-09-01 DIAGNOSIS — M6281 Muscle weakness (generalized): Secondary | ICD-10-CM | POA: Diagnosis not present

## 2020-09-01 DIAGNOSIS — M545 Low back pain: Secondary | ICD-10-CM | POA: Diagnosis not present

## 2020-09-02 DIAGNOSIS — R278 Other lack of coordination: Secondary | ICD-10-CM | POA: Diagnosis not present

## 2020-09-02 DIAGNOSIS — M6281 Muscle weakness (generalized): Secondary | ICD-10-CM | POA: Diagnosis not present

## 2020-09-02 DIAGNOSIS — E119 Type 2 diabetes mellitus without complications: Secondary | ICD-10-CM | POA: Diagnosis not present

## 2020-09-02 DIAGNOSIS — M545 Low back pain: Secondary | ICD-10-CM | POA: Diagnosis not present

## 2020-09-03 DIAGNOSIS — M545 Low back pain: Secondary | ICD-10-CM | POA: Diagnosis not present

## 2020-09-03 DIAGNOSIS — M6281 Muscle weakness (generalized): Secondary | ICD-10-CM | POA: Diagnosis not present

## 2020-09-03 DIAGNOSIS — E785 Hyperlipidemia, unspecified: Secondary | ICD-10-CM | POA: Diagnosis not present

## 2020-09-03 DIAGNOSIS — I10 Essential (primary) hypertension: Secondary | ICD-10-CM | POA: Diagnosis not present

## 2020-09-03 DIAGNOSIS — R278 Other lack of coordination: Secondary | ICD-10-CM | POA: Diagnosis not present

## 2020-09-03 DIAGNOSIS — E119 Type 2 diabetes mellitus without complications: Secondary | ICD-10-CM | POA: Diagnosis not present

## 2020-09-03 DIAGNOSIS — J449 Chronic obstructive pulmonary disease, unspecified: Secondary | ICD-10-CM | POA: Diagnosis not present

## 2020-09-03 DIAGNOSIS — F411 Generalized anxiety disorder: Secondary | ICD-10-CM | POA: Diagnosis not present

## 2020-09-04 DIAGNOSIS — R278 Other lack of coordination: Secondary | ICD-10-CM | POA: Diagnosis not present

## 2020-09-04 DIAGNOSIS — M545 Low back pain: Secondary | ICD-10-CM | POA: Diagnosis not present

## 2020-09-04 DIAGNOSIS — M6281 Muscle weakness (generalized): Secondary | ICD-10-CM | POA: Diagnosis not present

## 2020-09-04 DIAGNOSIS — E119 Type 2 diabetes mellitus without complications: Secondary | ICD-10-CM | POA: Diagnosis not present

## 2020-09-05 DIAGNOSIS — M6281 Muscle weakness (generalized): Secondary | ICD-10-CM | POA: Diagnosis not present

## 2020-09-05 DIAGNOSIS — M545 Low back pain: Secondary | ICD-10-CM | POA: Diagnosis not present

## 2020-09-05 DIAGNOSIS — E119 Type 2 diabetes mellitus without complications: Secondary | ICD-10-CM | POA: Diagnosis not present

## 2020-09-05 DIAGNOSIS — R278 Other lack of coordination: Secondary | ICD-10-CM | POA: Diagnosis not present

## 2020-09-08 DIAGNOSIS — R278 Other lack of coordination: Secondary | ICD-10-CM | POA: Diagnosis not present

## 2020-09-08 DIAGNOSIS — M545 Low back pain: Secondary | ICD-10-CM | POA: Diagnosis not present

## 2020-09-08 DIAGNOSIS — M6281 Muscle weakness (generalized): Secondary | ICD-10-CM | POA: Diagnosis not present

## 2020-09-08 DIAGNOSIS — E119 Type 2 diabetes mellitus without complications: Secondary | ICD-10-CM | POA: Diagnosis not present

## 2020-09-09 DIAGNOSIS — M6281 Muscle weakness (generalized): Secondary | ICD-10-CM | POA: Diagnosis not present

## 2020-09-09 DIAGNOSIS — M545 Low back pain: Secondary | ICD-10-CM | POA: Diagnosis not present

## 2020-09-09 DIAGNOSIS — R278 Other lack of coordination: Secondary | ICD-10-CM | POA: Diagnosis not present

## 2020-09-09 DIAGNOSIS — E119 Type 2 diabetes mellitus without complications: Secondary | ICD-10-CM | POA: Diagnosis not present

## 2020-09-09 DIAGNOSIS — I10 Essential (primary) hypertension: Secondary | ICD-10-CM | POA: Diagnosis not present

## 2020-09-10 DIAGNOSIS — R278 Other lack of coordination: Secondary | ICD-10-CM | POA: Diagnosis not present

## 2020-09-10 DIAGNOSIS — E119 Type 2 diabetes mellitus without complications: Secondary | ICD-10-CM | POA: Diagnosis not present

## 2020-09-10 DIAGNOSIS — M6281 Muscle weakness (generalized): Secondary | ICD-10-CM | POA: Diagnosis not present

## 2020-09-10 DIAGNOSIS — M545 Low back pain: Secondary | ICD-10-CM | POA: Diagnosis not present

## 2020-09-11 DIAGNOSIS — M6281 Muscle weakness (generalized): Secondary | ICD-10-CM | POA: Diagnosis not present

## 2020-09-11 DIAGNOSIS — M545 Low back pain: Secondary | ICD-10-CM | POA: Diagnosis not present

## 2020-09-11 DIAGNOSIS — R278 Other lack of coordination: Secondary | ICD-10-CM | POA: Diagnosis not present

## 2020-09-11 DIAGNOSIS — E119 Type 2 diabetes mellitus without complications: Secondary | ICD-10-CM | POA: Diagnosis not present

## 2020-09-12 DIAGNOSIS — M6281 Muscle weakness (generalized): Secondary | ICD-10-CM | POA: Diagnosis not present

## 2020-09-12 DIAGNOSIS — R278 Other lack of coordination: Secondary | ICD-10-CM | POA: Diagnosis not present

## 2020-09-12 DIAGNOSIS — M545 Low back pain: Secondary | ICD-10-CM | POA: Diagnosis not present

## 2020-09-12 DIAGNOSIS — E119 Type 2 diabetes mellitus without complications: Secondary | ICD-10-CM | POA: Diagnosis not present

## 2020-09-15 DIAGNOSIS — R278 Other lack of coordination: Secondary | ICD-10-CM | POA: Diagnosis not present

## 2020-09-15 DIAGNOSIS — M6281 Muscle weakness (generalized): Secondary | ICD-10-CM | POA: Diagnosis not present

## 2020-09-15 DIAGNOSIS — E119 Type 2 diabetes mellitus without complications: Secondary | ICD-10-CM | POA: Diagnosis not present

## 2020-09-15 DIAGNOSIS — M545 Low back pain: Secondary | ICD-10-CM | POA: Diagnosis not present

## 2020-09-16 DIAGNOSIS — M6281 Muscle weakness (generalized): Secondary | ICD-10-CM | POA: Diagnosis not present

## 2020-09-16 DIAGNOSIS — E119 Type 2 diabetes mellitus without complications: Secondary | ICD-10-CM | POA: Diagnosis not present

## 2020-09-16 DIAGNOSIS — R278 Other lack of coordination: Secondary | ICD-10-CM | POA: Diagnosis not present

## 2020-09-16 DIAGNOSIS — M545 Low back pain: Secondary | ICD-10-CM | POA: Diagnosis not present

## 2021-04-02 ENCOUNTER — Other Ambulatory Visit: Payer: Self-pay

## 2021-04-02 ENCOUNTER — Emergency Department (HOSPITAL_COMMUNITY): Payer: Medicare Other

## 2021-04-02 ENCOUNTER — Encounter (HOSPITAL_COMMUNITY): Payer: Self-pay | Admitting: Gastroenterology

## 2021-04-02 ENCOUNTER — Inpatient Hospital Stay (HOSPITAL_COMMUNITY)
Admission: EM | Admit: 2021-04-02 | Discharge: 2021-04-07 | DRG: 871 | Disposition: A | Discharge status: Skilled Nursing Facility | Payer: Medicare Other | Source: Skilled Nursing Facility | Attending: Internal Medicine | Admitting: Internal Medicine

## 2021-04-02 DIAGNOSIS — E876 Hypokalemia: Secondary | ICD-10-CM | POA: Diagnosis present

## 2021-04-02 DIAGNOSIS — K851 Biliary acute pancreatitis without necrosis or infection: Secondary | ICD-10-CM | POA: Diagnosis not present

## 2021-04-02 DIAGNOSIS — Z882 Allergy status to sulfonamides status: Secondary | ICD-10-CM

## 2021-04-02 DIAGNOSIS — R4781 Slurred speech: Secondary | ICD-10-CM | POA: Diagnosis not present

## 2021-04-02 DIAGNOSIS — Z7189 Other specified counseling: Secondary | ICD-10-CM | POA: Diagnosis not present

## 2021-04-02 DIAGNOSIS — Z79899 Other long term (current) drug therapy: Secondary | ICD-10-CM

## 2021-04-02 DIAGNOSIS — Z681 Body mass index (BMI) 19 or less, adult: Secondary | ICD-10-CM

## 2021-04-02 DIAGNOSIS — Z66 Do not resuscitate: Secondary | ICD-10-CM | POA: Diagnosis present

## 2021-04-02 DIAGNOSIS — E860 Dehydration: Secondary | ICD-10-CM | POA: Diagnosis present

## 2021-04-02 DIAGNOSIS — D6959 Other secondary thrombocytopenia: Secondary | ICD-10-CM | POA: Diagnosis present

## 2021-04-02 DIAGNOSIS — K8309 Other cholangitis: Secondary | ICD-10-CM

## 2021-04-02 DIAGNOSIS — I4891 Unspecified atrial fibrillation: Secondary | ICD-10-CM | POA: Diagnosis present

## 2021-04-02 DIAGNOSIS — R339 Retention of urine, unspecified: Secondary | ICD-10-CM | POA: Diagnosis present

## 2021-04-02 DIAGNOSIS — D61818 Other pancytopenia: Secondary | ICD-10-CM | POA: Diagnosis present

## 2021-04-02 DIAGNOSIS — Z789 Other specified health status: Secondary | ICD-10-CM | POA: Diagnosis not present

## 2021-04-02 DIAGNOSIS — K8033 Calculus of bile duct with acute cholangitis with obstruction: Secondary | ICD-10-CM | POA: Diagnosis present

## 2021-04-02 DIAGNOSIS — Z20822 Contact with and (suspected) exposure to covid-19: Secondary | ICD-10-CM | POA: Diagnosis present

## 2021-04-02 DIAGNOSIS — K8050 Calculus of bile duct without cholangitis or cholecystitis without obstruction: Secondary | ICD-10-CM | POA: Diagnosis not present

## 2021-04-02 DIAGNOSIS — R443 Hallucinations, unspecified: Secondary | ICD-10-CM | POA: Diagnosis not present

## 2021-04-02 DIAGNOSIS — Z87891 Personal history of nicotine dependence: Secondary | ICD-10-CM | POA: Diagnosis not present

## 2021-04-02 DIAGNOSIS — K219 Gastro-esophageal reflux disease without esophagitis: Secondary | ICD-10-CM | POA: Diagnosis present

## 2021-04-02 DIAGNOSIS — R1084 Generalized abdominal pain: Secondary | ICD-10-CM

## 2021-04-02 DIAGNOSIS — F0390 Unspecified dementia without behavioral disturbance: Secondary | ICD-10-CM | POA: Diagnosis present

## 2021-04-02 DIAGNOSIS — I110 Hypertensive heart disease with heart failure: Secondary | ICD-10-CM | POA: Diagnosis present

## 2021-04-02 DIAGNOSIS — N3289 Other specified disorders of bladder: Secondary | ICD-10-CM | POA: Diagnosis present

## 2021-04-02 DIAGNOSIS — R1032 Left lower quadrant pain: Secondary | ICD-10-CM | POA: Diagnosis not present

## 2021-04-02 DIAGNOSIS — D539 Nutritional anemia, unspecified: Secondary | ICD-10-CM | POA: Diagnosis present

## 2021-04-02 DIAGNOSIS — R6521 Severe sepsis with septic shock: Secondary | ICD-10-CM | POA: Diagnosis present

## 2021-04-02 DIAGNOSIS — R451 Restlessness and agitation: Secondary | ICD-10-CM | POA: Diagnosis not present

## 2021-04-02 DIAGNOSIS — Q211 Atrial septal defect: Secondary | ICD-10-CM

## 2021-04-02 DIAGNOSIS — G9341 Metabolic encephalopathy: Secondary | ICD-10-CM | POA: Diagnosis not present

## 2021-04-02 DIAGNOSIS — R011 Cardiac murmur, unspecified: Secondary | ICD-10-CM | POA: Diagnosis present

## 2021-04-02 DIAGNOSIS — Z79890 Hormone replacement therapy (postmenopausal): Secondary | ICD-10-CM

## 2021-04-02 DIAGNOSIS — Z9889 Other specified postprocedural states: Secondary | ICD-10-CM | POA: Diagnosis not present

## 2021-04-02 DIAGNOSIS — Z8249 Family history of ischemic heart disease and other diseases of the circulatory system: Secondary | ICD-10-CM

## 2021-04-02 DIAGNOSIS — R109 Unspecified abdominal pain: Secondary | ICD-10-CM | POA: Diagnosis not present

## 2021-04-02 DIAGNOSIS — E43 Unspecified severe protein-calorie malnutrition: Secondary | ICD-10-CM | POA: Diagnosis present

## 2021-04-02 DIAGNOSIS — E119 Type 2 diabetes mellitus without complications: Secondary | ICD-10-CM | POA: Diagnosis present

## 2021-04-02 DIAGNOSIS — Z515 Encounter for palliative care: Secondary | ICD-10-CM | POA: Diagnosis not present

## 2021-04-02 DIAGNOSIS — J449 Chronic obstructive pulmonary disease, unspecified: Secondary | ICD-10-CM | POA: Diagnosis present

## 2021-04-02 DIAGNOSIS — G8929 Other chronic pain: Secondary | ICD-10-CM | POA: Diagnosis present

## 2021-04-02 DIAGNOSIS — I509 Heart failure, unspecified: Secondary | ICD-10-CM | POA: Diagnosis present

## 2021-04-02 DIAGNOSIS — R1010 Upper abdominal pain, unspecified: Secondary | ICD-10-CM | POA: Diagnosis not present

## 2021-04-02 DIAGNOSIS — A419 Sepsis, unspecified organism: Principal | ICD-10-CM | POA: Diagnosis present

## 2021-04-02 DIAGNOSIS — I099 Rheumatic heart disease, unspecified: Secondary | ICD-10-CM | POA: Diagnosis present

## 2021-04-02 DIAGNOSIS — R296 Repeated falls: Secondary | ICD-10-CM | POA: Diagnosis present

## 2021-04-02 DIAGNOSIS — R7989 Other specified abnormal findings of blood chemistry: Secondary | ICD-10-CM | POA: Diagnosis not present

## 2021-04-02 DIAGNOSIS — Z952 Presence of prosthetic heart valve: Secondary | ICD-10-CM

## 2021-04-02 DIAGNOSIS — K859 Acute pancreatitis without necrosis or infection, unspecified: Secondary | ICD-10-CM | POA: Diagnosis present

## 2021-04-02 DIAGNOSIS — Z886 Allergy status to analgesic agent status: Secondary | ICD-10-CM

## 2021-04-02 DIAGNOSIS — R195 Other fecal abnormalities: Secondary | ICD-10-CM | POA: Diagnosis present

## 2021-04-02 DIAGNOSIS — E039 Hypothyroidism, unspecified: Secondary | ICD-10-CM | POA: Diagnosis present

## 2021-04-02 DIAGNOSIS — K8500 Idiopathic acute pancreatitis without necrosis or infection: Secondary | ICD-10-CM | POA: Diagnosis not present

## 2021-04-02 LAB — COMPREHENSIVE METABOLIC PANEL
ALT: 434 U/L — ABNORMAL HIGH (ref 0–44)
AST: 897 U/L — ABNORMAL HIGH (ref 15–41)
Albumin: 3.1 g/dL — ABNORMAL LOW (ref 3.5–5.0)
Alkaline Phosphatase: 249 U/L — ABNORMAL HIGH (ref 38–126)
Anion gap: 15 (ref 5–15)
BUN: 27 mg/dL — ABNORMAL HIGH (ref 8–23)
CO2: 21 mmol/L — ABNORMAL LOW (ref 22–32)
Calcium: 8.6 mg/dL — ABNORMAL LOW (ref 8.9–10.3)
Chloride: 100 mmol/L (ref 98–111)
Creatinine, Ser: 0.63 mg/dL (ref 0.44–1.00)
GFR, Estimated: >60 mL/min (ref >60)
Glucose, Bld: 172 mg/dL — ABNORMAL HIGH (ref 70–99)
Potassium: 4 mmol/L (ref 3.5–5.1)
Sodium: 136 mmol/L (ref 135–145)
Total Bilirubin: 2.4 mg/dL — ABNORMAL HIGH (ref 0.3–1.2)
Total Protein: 6.6 g/dL (ref 6.5–8.1)

## 2021-04-02 LAB — CBC WITH DIFFERENTIAL/PLATELET
Band Neutrophils: 14 %
Basophils Absolute: 0 10*3/uL (ref 0.0–0.1)
Basophils Relative: 0 %
Eosinophils Absolute: 0 10*3/uL (ref 0.0–0.5)
Eosinophils Relative: 0 %
HCT: 36.9 % (ref 36.0–46.0)
Hemoglobin: 11.8 g/dL — ABNORMAL LOW (ref 12.0–15.0)
Lymphocytes Relative: 5 %
Lymphs Abs: 0.2 10*3/uL — ABNORMAL LOW (ref 0.7–4.0)
MCH: 35 pg — ABNORMAL HIGH (ref 26.0–34.0)
MCHC: 32 g/dL (ref 30.0–36.0)
MCV: 109.5 fL — ABNORMAL HIGH (ref 80.0–100.0)
Metamyelocytes Relative: 15 %
Monocytes Absolute: 0 10*3/uL — ABNORMAL LOW (ref 0.1–1.0)
Monocytes Relative: 0 %
Myelocytes: 1 %
Neutro Abs: 3.5 10*3/uL (ref 1.7–7.7)
Neutrophils Relative %: 65 %
Platelets: 89 10*3/uL — ABNORMAL LOW (ref 150–400)
RBC: 3.37 MIL/uL — ABNORMAL LOW (ref 3.87–5.11)
RDW: 13.3 % (ref 11.5–15.5)
WBC: 4.4 10*3/uL (ref 4.0–10.5)
nRBC: 0 % (ref 0.0–0.2)

## 2021-04-02 LAB — PROCALCITONIN: Procalcitonin: 5.57 ng/mL

## 2021-04-02 LAB — LIPASE, BLOOD: Lipase: 472 U/L — ABNORMAL HIGH (ref 11–51)

## 2021-04-02 LAB — LACTIC ACID, PLASMA: Lactic Acid, Venous: 4.1 mmol/L (ref 0.5–1.9)

## 2021-04-02 MED ORDER — SODIUM CHLORIDE 0.9 % IV BOLUS: 1000.0000 mL | Freq: Once | INTRAVENOUS | Status: DC

## 2021-04-02 MED ORDER — SODIUM CHLORIDE 0.9 % IV BOLUS
500.0000 mL | Freq: Once | INTRAVENOUS | Status: AC
  Administered 2021-04-02: 500 mL via INTRAVENOUS

## 2021-04-02 MED ORDER — LEVOTHYROXINE SODIUM 100 MCG PO TABS
100.0000 ug | ORAL_TABLET | Freq: Every day | ORAL | Status: DC
  Administered 2021-04-03 – 2021-04-06 (×3): 100 ug via ORAL
  Filled 2021-04-02 (×3): qty 1

## 2021-04-02 MED ORDER — DIVALPROEX SODIUM 125 MG PO DR TAB
125.0000 mg | DELAYED_RELEASE_TABLET | Freq: Three times a day (TID) | ORAL | Status: DC
  Administered 2021-04-02 – 2021-04-03 (×3): 125 mg via ORAL
  Filled 2021-04-02 (×10): qty 1
  Filled 2021-04-02: qty 5
  Filled 2021-04-02: qty 1

## 2021-04-02 MED ORDER — SODIUM CHLORIDE 0.9% FLUSH
3.0000 mL | Freq: Two times a day (BID) | INTRAVENOUS | Status: DC
  Administered 2021-04-02 – 2021-04-07 (×8): 3 mL via INTRAVENOUS

## 2021-04-02 MED ORDER — HYDROMORPHONE HCL 1 MG/ML IJ SOLN
0.5000 mg | Freq: Once | INTRAMUSCULAR | Status: AC
Start: 2021-04-02 — End: 2021-04-02
  Administered 2021-04-02: 0.5 mg via INTRAVENOUS
  Filled 2021-04-02: qty 1

## 2021-04-02 MED ORDER — ONDANSETRON HCL 4 MG PO TABS: 4.0000 mg | ORAL_TABLET | Freq: Four times a day (QID) | ORAL | Status: DC | PRN

## 2021-04-02 MED ORDER — ONDANSETRON HCL 4 MG/2ML IJ SOLN
4.0000 mg | Freq: Once | INTRAMUSCULAR | Status: AC
  Administered 2021-04-02: 4 mg via INTRAVENOUS
  Filled 2021-04-02: qty 2

## 2021-04-02 MED ORDER — PIPERACILLIN-TAZOBACTAM 3.375 G IVPB 30 MIN
3.3750 g | Freq: Once | INTRAVENOUS | Status: AC
  Administered 2021-04-02: 3.375 g via INTRAVENOUS
  Filled 2021-04-02: qty 50

## 2021-04-02 MED ORDER — TIZANIDINE HCL 2 MG PO TABS
2.0000 mg | ORAL_TABLET | Freq: Every day | ORAL | Status: DC
  Administered 2021-04-02 – 2021-04-05 (×3): 2 mg via ORAL
  Filled 2021-04-02 (×4): qty 1

## 2021-04-02 MED ORDER — NOREPINEPHRINE 4 MG/250ML-% IV SOLN
0.0000 ug/min | INTRAVENOUS | Status: DC
  Administered 2021-04-02: 2 ug/min via INTRAVENOUS
  Administered 2021-04-03: 4 ug/min via INTRAVENOUS
  Filled 2021-04-02 (×2): qty 250

## 2021-04-02 MED ORDER — ONDANSETRON HCL 4 MG/2ML IJ SOLN: 4.0000 mg | Freq: Four times a day (QID) | INTRAMUSCULAR | Status: DC | PRN

## 2021-04-02 MED ORDER — SODIUM CHLORIDE 0.9 % IV BOLUS
1000.0000 mL | Freq: Once | INTRAVENOUS | Status: AC
  Administered 2021-04-02: 1000 mL via INTRAVENOUS

## 2021-04-02 MED ORDER — PIPERACILLIN-TAZOBACTAM 3.375 G IVPB
3.3750 g | Freq: Three times a day (TID) | INTRAVENOUS | Status: DC
  Administered 2021-04-03 – 2021-04-06 (×11): 3.375 g via INTRAVENOUS
  Filled 2021-04-02 (×11): qty 50

## 2021-04-02 MED ORDER — OXYCODONE HCL 5 MG PO TABS
2.5000 mg | ORAL_TABLET | ORAL | Status: DC | PRN
  Administered 2021-04-02 – 2021-04-03 (×2): 2.5 mg via ORAL
  Filled 2021-04-02 (×2): qty 1

## 2021-04-02 MED ORDER — FAMOTIDINE 20 MG PO TABS
20.0000 mg | ORAL_TABLET | Freq: Every day | ORAL | Status: DC
  Administered 2021-04-03 – 2021-04-07 (×4): 20 mg via ORAL
  Filled 2021-04-02 (×4): qty 1

## 2021-04-02 MED ORDER — SODIUM CHLORIDE 0.9 % IV SOLN: INTRAVENOUS | Status: DC

## 2021-04-02 NOTE — Progress Notes (Signed)
Patient transferred to ICU room 9, MRSA nare collected, CHG completed, oriented to room and environment. Belongings accounted for. Placed on monitor

## 2021-04-02 NOTE — Progress Notes (Signed)
son's home number is [phone]  mobile is [phone] and his wife's number is [phone]   Huey Bienenstock MD

## 2021-04-02 NOTE — ED Notes (Signed)
Son is Administrator, sports. Contact information  Home: [phone] Mobile: [phone]

## 2021-04-02 NOTE — Consult Note (Addendum)
Referring Provider: Vanetta Mulders, MD Primary Care Physician:  Toma Deiters, MD Primary Gastroenterologist:  Roetta Sessions, MD  Reason for Consultation:  Pancreatitis, probable biliary obstruction  HPI: [place]HELLE [name] is a 87 y.o. female resident of skilled nursing facility, with past medical history of rheumatic heart disease status post mitral valve replacement, s/p PFO closure, syncope s/p implantable loop recorder device, diabetes, COPD, A. Fib s/p Cox-Maze procedure maintaining sinus rhythm, CHF presenting from the nursing home with complaints of abdominal pain.  Patient states pain is unbearable.  Initially she tells me that its been occurring for days but I get the impression that has been going on much longer.  She describes an unspecified amount of weight loss due to poor appetite.  States she just cannot eat. Some nausea. No vomiting.  Abdominal pain worse with meals.  She points to the right upper abdomen as location of the pain.  Bowel movements are regular.  States her stools have been black and seedy.  Has had 2 stools today.  ED work-up: Hemoglobin 11.8, platelets 89,000, glucose 172, BUN 27, creatinine 0.63, total bilirubin 2.4, alkaline phosphatase 249, AST 897, ALT 434, albumin 3.1, lipase 472.  CT abdomen pelvis without contrast (compared to 2018 study), intrahepatic and extrahepatic biliary ductal dilation appears worse than on prior CT.  Gallbladder has been removed.  Marked atrophy of the body and tail of pancreas.  The head and the neck of the pancreas appears swollen with surrounding stranding likely due to pancreatitis, underlying neoplasm and possible.  Because of obstruction not seen, could be due to stone which is occult on CT or possibly pancreatic neoplasm.  Marked distention of the urinary bladder.  Review of care everywhere: Labs from June 2021, normal LFTs.  Platelet count at that time was 99,000.   MAR FROM NURSING HOME NOT AVAILABLE AT TIME OF CONSULT  BUT HAS BEEN REQUESTED.   Prior to Admission medications   Medication Sig Start Date End Date Taking? Authorizing Provider  acetaminophen (TYLENOL) 500 MG tablet Take 500 mg by mouth every 6 (six) hours as needed.   Yes [provider]  cholecalciferol (VITAMIN D) 25 MCG (1000 UNIT) tablet Take 1 tablet by mouth daily.   Yes [provider]  divalproex (DEPAKOTE) 125 MG DR tablet Take 125 mg by mouth 3 (three) times daily.   Yes [provider]  famotidine (PEPCID) 20 MG tablet Take 20 mg by mouth daily.   Yes [provider]  ferrous sulfate 325 (65 FE) MG tablet Take 325 mg by mouth 2 (two) times daily with a meal.   Yes [provider]  fluticasone (FLONASE SENSIMIST) 27.5 MCG/SPRAY nasal spray Place 1 spray into the nose daily.   Yes [provider]  guaiFENesin-dextromethorphan (ROBITUSSIN DM) 100-10 MG/5ML syrup Take 10 mLs by mouth every 4 (four) hours as needed for cough.   Yes [provider]  levothyroxine (SYNTHROID, LEVOTHROID) 100 MCG tablet Take 100 mcg by mouth daily before breakfast.   Yes [provider]  lidocaine (LIDODERM) 5 % Place 1 patch onto the skin daily. Remove & Discard patch within 12 hours or as directed by MD AT BEDTIME   Yes [provider]  loratadine (CLARITIN) 10 MG tablet Take 10 mg by mouth daily.   Yes [provider]  magnesium hydroxide (MILK OF MAGNESIA) 400 MG/5ML suspension Take 30 mLs by mouth daily as needed for mild constipation.   Yes [provider]  melatonin 5  MG TABS Take 5 mg by mouth at bedtime.   Yes [provider]  metoprolol succinate (TOPROL-XL) 25 MG 24 hr tablet Take 1 tablet (25 mg total) by mouth daily. Patient taking differently: Take 12.5 mg by mouth daily. 12.5mg  in morning for htn hold for bp 110/60 or HR60 02/10/16  Yes [name], MD  mirtazapine (REMERON) 7.5 MG tablet Take 7.5 mg by mouth at bedtime.   Yes  [provider]  OLANZapine (ZYPREXA) 2.5 MG tablet Take 1.25 mg by mouth at bedtime.   Yes [provider]  senna (SENOKOT) 8.6 MG tablet Take 2 tablets by mouth daily.   Yes [provider]  tizanidine (ZANAFLEX) 2 MG capsule Take 2 mg by mouth at bedtime.   Yes [provider]    Current Facility-Administered Medications  Medication Dose Route Frequency Provider Last Rate Last Admin  . 0.9 %  sodium chloride infusion   Intravenous Continuous Vanetta Mulders, MD 75 mL/hr at 04/02/21 1130 New Bag at 04/02/21 1130  . sodium chloride 0.9 % bolus 1,000 mL  1,000 mL Intravenous Once Elgergawy, Leana Roe, MD       Current Outpatient Medications  Medication Sig Dispense Refill  . acetaminophen (TYLENOL) 500 MG tablet Take 500 mg by mouth every 6 (six) hours as needed.    . cholecalciferol (VITAMIN D) 25 MCG (1000 UNIT) tablet Take 1 tablet by mouth daily.    . divalproex (DEPAKOTE) 125 MG DR tablet Take 125 mg by mouth 3 (three) times daily.    . famotidine (PEPCID) 20 MG tablet Take 20 mg by mouth daily.    . ferrous sulfate 325 (65 FE) MG tablet Take 325 mg by mouth 2 (two) times daily with a meal.    . fluticasone (FLONASE SENSIMIST) 27.5 MCG/SPRAY nasal spray Place 1 spray into the nose daily.    Marland Kitchen guaiFENesin-dextromethorphan (ROBITUSSIN DM) 100-10 MG/5ML syrup Take 10 mLs by mouth every 4 (four) hours as needed for cough.    . levothyroxine (SYNTHROID, LEVOTHROID) 100 MCG tablet Take 100 mcg by mouth daily before breakfast.    . lidocaine (LIDODERM) 5 % Place 1 patch onto the skin daily. Remove & Discard patch within 12 hours or as directed by MD AT BEDTIME    . loratadine (CLARITIN) 10 MG tablet Take 10 mg by mouth daily.    . magnesium hydroxide (MILK OF MAGNESIA) 400 MG/5ML suspension Take 30 mLs by mouth daily as needed for mild constipation.    . melatonin 5 MG TABS Take 5 mg by mouth at bedtime.    . metoprolol succinate (TOPROL-XL) 25 MG 24 hr  tablet Take 1 tablet (25 mg total) by mouth daily. (Patient taking differently: Take 12.5 mg by mouth daily. 12.5mg  in morning for htn hold for bp 110/60 or HR60) 90 tablet 3  . mirtazapine (REMERON) 7.5 MG tablet Take 7.5 mg by mouth at bedtime.    Marland Kitchen OLANZapine (ZYPREXA) 2.5 MG tablet Take 1.25 mg by mouth at bedtime.    . senna (SENOKOT) 8.6 MG tablet Take 2 tablets by mouth daily.    . tizanidine (ZANAFLEX) 2 MG capsule Take 2 mg by mouth at bedtime.      Allergies as of 04/02/2021 - Review Complete 04/02/2021  Allergen Reaction Noted  . Sulfonamide derivatives Anaphylaxis, Shortness Of Breath, and Swelling   . Aspirin      Past Medical History:  Diagnosis Date  . Arrhythmia    Sinus tachycardia  .  Atrial fibrillation (HCC)    status post Cox-Maze procedure maintaining normal sinus rhythm, amiodarone and Coumadin discontinued.  . CHF (congestive heart failure) (HCC)    normal LV systolic function  . COPD (chronic obstructive pulmonary disease) (HCC)   . Degenerative lumbar spinal stenosis    Severe degenerative disk disease of lumbosacral spine  . Diabetes (HCC)   . GERD (gastroesophageal reflux disease)   . Hypothyroidism   . PFO (patent foramen ovale)    status post PFO closure at time of surgery  . Rheumatic heart disease    s/p right thoracotomy and mitral valve replacement with [employer] Mosaic porcine tissue valve  . Syncope    recurrent unexplained syncope    Past Surgical History:  Procedure Laterality Date  . ABDOMINAL HYSTERECTOMY    . CHOLECYSTECTOMY    . COLON SURGERY     Details unavailable but on CT scan she has a surgical anastomosis in the sigmoid colon.  . EP IMPLANTABLE DEVICE N/A 06/27/2015   MDT LINQ ILR implanted by Dr Johney Frame for unexplained syncope  . EXPLORATORY LAPAROTOMY    . FOOT SURGERY     left foot  . MITRAL VALVE REPLACEMENT    . PERCUTANEOUS BALLOON VALVULOPLASTY  1993   Mitral valve    Family History  Problem Relation Age of Onset   . Heart disease Father   . Heart attack Father   . Heart disease Brother   . Heart attack Brother   . Heart attack Son   . Heart disease Son   . Coronary artery disease Neg Hx   . Diabetes Neg Hx   . Hypertension Neg Hx     Social History   Socioeconomic History  . Marital status: Widowed    Spouse name: Not on file  . Number of children: Not on file  . Years of education: Not on file  . Highest education level: Not on file  Occupational History  . Not on file  Tobacco Use  . Smoking status: Former Smoker    Packs/day: 0.30    Years: 18.00    Pack years: 5.40    Types: Cigarettes    Start date: 05/26/1977    Quit date: 12/20/1988    Years since quitting: 32.3  . Smokeless tobacco: Never Used  Substance and Sexual Activity  . Alcohol use: No    Alcohol/week: 0.0 standard drinks  . Drug use: No  . Sexual activity: Not on file  Other Topics Concern  . Not on file  Social History Narrative   Lives in [place]   Social Determinants of Health   Financial Resource Strain: Not on file  Food Insecurity: Not on file  Transportation Needs: Not on file  Physical Activity: Not on file  Stress: Not on file  Social Connections: Not on file  Intimate Partner Violence: Not on file     ROS:  General: Negative for  fever, chills, fatigue. See hpi Eyes: Negative for vision changes.  ENT: Negative for hoarseness, difficulty swallowing , nasal congestion. CV: Negative for chest pain, angina, palpitations, dyspnea on exertion, peripheral edema.  Respiratory: Negative for dyspnea at rest, dyspnea on exertion, cough, sputum, wheezing.  GI: See history of present illness. GU:  Negative for dysuria, hematuria, urinary incontinence, urinary frequency, nocturnal urination.  MS: Negative for joint pain, low back pain.  Derm: Negative for rash or itching.  Neuro: Negative for weakness, abnormal sensation, seizure, frequent headaches, memory loss, confusion.  Psych: Negative for anxiety,  depression, suicidal ideation, hallucinations.  Endo: see hpi.  Heme: Negative for bruising or bleeding. Allergy: Negative for rash or hives.       Physical Examination: Vital signs in last 24 hours: Temp:  [98.6 F (37 C)] 98.6 F (37 C) (04/14 1034) Pulse Rate:  [88-101] 92 (04/14 1400) Resp:  [9-22] 16 (04/14 1400) BP: (83-141)/(42-65) 83/46 (04/14 1400) SpO2:  [84 %-99 %] 94 % (04/14 1400) Weight:  [36.3 kg] 36.3 kg (04/14 1035)    General: pleasant thin elderly lady appears uncomfortable but NAD.   Head: Normocephalic, atraumatic.   Eyes: Conjunctiva pink, no icterus. Mouth: Oropharyngeal mucosa moist and pink , no lesions erythema or exudate. Neck: Supple without thyromegaly, masses, or lymphadenopathy.  Lungs: Clear to auscultation bilaterally.  Heart: Regular rate and rhythm, no murmurs rubs or gallops.  Abdomen: Bowel sounds are normal,  nondistended, no hepatosplenomegaly or masses, no abdominal bruits or    hernia , no rebound or guarding.  Moderate ruq tenderness and llq tenderness to deep palpation Rectal: not performed Extremities: No lower extremity edema, clubbing, deformity.  Neuro: Alert and oriented x 4 , grossly normal neurologically.  Skin: Warm and dry, no rash or jaundice.   Psych: Alert and cooperative, normal mood and affect.        Intake/Output from previous day: No intake/output data recorded. Intake/Output this shift: Total I/O In: 500 [IV Piggyback:500] Out: -   Lab Results: CBC Recent Labs    04/02/21 1218  WBC 4.4  HGB 11.8*  HCT 36.9  MCV 109.5*  PLT 89*   BMET Recent Labs    04/02/21 1218  NA 136  K 4.0  CL 100  CO2 21*  GLUCOSE 172*  BUN 27*  CREATININE 0.63  CALCIUM 8.6*   LFT Recent Labs    04/02/21 1218  BILITOT 2.4*  ALKPHOS 249*  AST 897*  ALT 434*  PROT 6.6  ALBUMIN 3.1*    Lipase Recent Labs    04/02/21 1218  LIPASE 472*    PT/INR No results for input(s): LABPROT, INR in the last 72 hours.     Imaging Studies: CT ABDOMEN PELVIS WO CONTRAST  Result Date: 04/02/2021 CLINICAL DATA:  Left lower quadrant pain for 2-3 days. EXAM: CT ABDOMEN AND PELVIS WITHOUT CONTRAST TECHNIQUE: Multidetector CT imaging of the abdomen and pelvis was performed following the standard protocol without IV contrast. COMPARISON:  CT angiogram of the chest, abdomen and pelvis 10/09/2017. FINDINGS: Lower chest: Mild cardiomegaly. Small pleural effusions, greater on the left. Dependent atelectasis. Hepatobiliary: No focal liver lesion is identified on this unenhanced exam. Intra and extrahepatic biliary ductal dilatation appears worse than on the prior CT. The gallbladder has been removed. Pancreas: The body and tail the pancreas are markedly atrophic. The head and neck of the pancreas appear swollen with surrounding stranding. Spleen: Normal in size without focal abnormality. Adrenals/Urinary Tract: Abnormal axis of orientation of the right kidney is unchanged. 2-3 cysts are seen in the lower pole the left kidney, also unchanged. The kidneys are otherwise unremarkable. The bladder is markedly distended. No urinary tract stones are seen. Adrenal glands are negative. Stomach/Bowel: Stomach is within normal limits. The appendix is not visualized but no pericecal inflammatory change is seen. No evidence of bowel wall thickening, distention, or inflammatory changes. Surgical anastomosis in the sigmoid colon is identified as on the prior study. Vascular/Lymphatic: Aortic atherosclerosis. No enlarged abdominal or pelvic lymph nodes. Reproductive: Status post hysterectomy. No adnexal masses. Other: There is  a small volume of perihepatic ascites. Mesenteric edema is noted. No focal fluid collection is seen. Musculoskeletal: Convex left scoliosis and multilevel degenerative change noted. No acute or focal abnormality. IMPRESSION: Intra and extrahepatic biliary ductal dilatation is new since the prior examination. Cause for the obstruction  is not seen and could be due to a stone which is occult on CT or possibly pancreatic neoplasm. Right upper quadrant ultrasound could be used to check for an occult common bile duct stone. Ill-defined and edematous appearing head of the pancreas with surrounding stranding is likely due to pancreatitis. Underlying neoplasm is possible. Marked distention of the urinary bladder. Electronically Signed   By: Drusilla Kanner M.D.   On: 04/02/2021 14:55   DG Chest Port 1 View  Result Date: 04/02/2021 CLINICAL DATA:  Chest pain. EXAM: PORTABLE CHEST 1 VIEW COMPARISON:  December 28, 2020. FINDINGS: The heart size and mediastinal contours are within normal limits. No pneumothorax or pleural effusion is noted. Stable interstitial densities are noted throughout both lungs most consistent with scarring. No acute abnormality is noted. Stable right apical scarring is noted. The visualized skeletal structures are unremarkable. IMPRESSION: No active disease. Aortic Atherosclerosis (ICD10-I70.0). Electronically Signed   By: Lupita Raider M.D.   On: 04/02/2021 11:34  [4 week]   Impression: 87 y/o female with multiple medical problems as outlined above presenting with significant abdominal pain (right upper quadrant and left lower quadrant), anorexia, weight loss.   ED evaluation significant for abnormal LFTs/lipase and CT findings of new intrahepatic and extrahepatic biliary ductal dilation, ill-defined and edematous appearing head of the pancreas with surrounding stranding likely acute pancreatitis, underlying neoplasm not excluded, etiology for biliary obstruction not identified on CT.  She is status post cholecystectomy remotely.  Also noted to be significantly hypotensive.  Upper abdominal pain due to likely pancreatitis as well as biliary obstruction.  Cannot exclude early cholangitis, although white blood cell count normal, she is afebrile.  Etiology of biliary obstruction not clear; external compression from  pancreatic head mass, papillary stenosis, choledocholithiasis all remain in differential.  If abdominal ultrasound unremarkable, she will need further imaging.  She may not be a candidate for MRI due to implantable loop recorder.  Chronic thrombocytopenia, noted on labs 1 year ago through care everywhere.  Marked distention urinary bladder on CT, may benefit from foley catheter.    Plan: 1. Aggressively hydrate patient given hypotension, dehydration and to adequately perfuse the pancreas in the setting of pancreatitis.  Patient has received boluses of 1500 cc normal saline.  Would continue rate of 100 cc/hr (3 mL/kg) for the next 8 to 12 hours. 2. Supportive measures. 3. N.p.o. for now. 4. Follow-up ultrasound findings. 5. Awaiting input from MRI regarding implantable loop recorder. 6. CMET, CBC, in am.   We would like to thank you for the opportunity to participate in the care of [name].  Leanna Battles. [facility] [phone] 4/14/20224:53 PM     LOS: 0 days

## 2021-04-02 NOTE — Progress Notes (Signed)
Pharmacy Antibiotic Note  [name] is a 87 y.o. female admitted on 04/02/2021 with intra-abdominal infection.  Pharmacy has been consulted for Zosyn dosing.  Plan: Zosyn 3.375g IV q8h (4 hour infusion).  Monitor labs, c/s, and patient improvement.  Height: 5\' 2"  (157.5 cm) Weight: 36.3 kg (80 lb) IBW/kg (Calculated) : 50.1  Temp (24hrs), Avg:98.6 F (37 C), Min:98.6 F (37 C), Max:98.6 F (37 C)  Recent Labs  Lab 04/02/21 1218  WBC 4.4  CREATININE 0.63    Estimated Creatinine Clearance: 25.2 mL/min (by C-G formula based on SCr of 0.63 mg/dL).    Allergies  Allergen Reactions  . Sulfonamide Derivatives Anaphylaxis, Shortness Of Breath and Swelling  . Aspirin     Uncoated Aspirin = stomach upset/pain EC Aspirin is ok    Antimicrobials this admission: Zosyn 4/14  >>     Microbiology results: 4/14 BCx: pending   Thank you for allowing pharmacy to be a part of this patient's care.  5/14 04/02/2021 4:18 PM

## 2021-04-02 NOTE — ED Notes (Signed)
Report to [place], RN ICU

## 2021-04-02 NOTE — ED Notes (Signed)
Patient is asleep and resting comfortably.  

## 2021-04-02 NOTE — ED Provider Notes (Addendum)
[facility] EMERGENCY DEPARTMENT Provider Note   CSN: [id number] Arrival date & time: 04/02/21  1037     History Chief Complaint  Patient presents with  . Abdominal Pain    [name] is a 87 y.o. female.  Patient brought in by [facility] from the [facility] in [place] for abdominal pain.  Patient states it hurts all over.  They stated that it was left lower quadrant abdominal pain.  Patient states been going on for a few days.  Now worse.  Denies any vomiting.  Patient is a DNR.        Past Medical History:  Diagnosis Date  . Arrhythmia    Sinus tachycardia  . Atrial fibrillation (HCC)    status post Cox-Maze procedure maintaining normal sinus rhythm, amiodarone and Coumadin discontinued.  . CHF (congestive heart failure) (HCC)    normal LV systolic function  . COPD (chronic obstructive pulmonary disease) ([place])   . Degenerative lumbar spinal stenosis    Severe degenerative disk disease of lumbosacral spine  . Diabetes ([place])   . GERD (gastroesophageal reflux disease)   . Hypothyroidism   . PFO (patent foramen ovale)    status post PFO closure at time of surgery  . Rheumatic heart disease    s/p right thoracotomy and mitral valve replacement with Medtronic Mosaic porcine tissue valve  . Syncope    recurrent unexplained syncope    Patient Active Problem List   Diagnosis Date Noted  . Chest pain 10/09/2017  . Syncope 10/24/2015  . Faintness   . PFO (patent foramen ovale)   . Atrial fibrillation ([place])   . GERD (gastroesophageal reflux disease)   . COPD (chronic obstructive pulmonary disease) ([place])   . Hypothyroidism   . Insomnia 09/23/2011  . DEPRESSION, SITUATIONAL, ACUTE 04/20/2010  . RHEUMATIC HEART DISEASE 11/10/2009  . CHF 11/10/2009  . COPD 11/10/2009  . SPINAL STENOSIS 11/10/2009  . MITRAL VALVE REPLACEMENT, HX OF 11/10/2009    Past Surgical History:  Procedure Laterality Date  . EP IMPLANTABLE DEVICE N/A 06/27/2015   MDT LINQ ILR  implanted by Dr Rayann Heman for unexplained syncope  . EXPLORATORY LAPAROTOMY    . FOOT SURGERY     left foot  . MITRAL VALVE REPLACEMENT    . PERCUTANEOUS BALLOON VALVULOPLASTY  1993   Mitral valve     OB History   No obstetric history on file.     Family History  Problem Relation Age of Onset  . Heart disease Father   . Heart attack Father   . Heart disease Brother   . Heart attack Brother   . Heart attack Son   . Heart disease Son   . Coronary artery disease Neg Hx   . Diabetes Neg Hx   . Hypertension Neg Hx     Social History   Tobacco Use  . Smoking status: Former Smoker    Packs/day: 0.30    Years: 18.00    Pack years: 5.40    Types: Cigarettes    Start date: 05/26/1977    Quit date: 12/20/1988    Years since quitting: 32.3  . Smokeless tobacco: Never Used  Substance Use Topics  . Alcohol use: No    Alcohol/week: 0.0 standard drinks  . Drug use: No    Home Medications Prior to Admission medications   Medication Sig Start Date End Date Taking? Authorizing Provider  acetaminophen (TYLENOL) 500 MG tablet Take 500 mg by mouth every 6 (six) hours as  needed.   Yes [provider]  cholecalciferol (VITAMIN D) 25 MCG (1000 UNIT) tablet Take 1 tablet by mouth daily.   Yes [provider]  divalproex (DEPAKOTE) 125 MG DR tablet Take 125 mg by mouth 3 (three) times daily.   Yes [provider]  famotidine (PEPCID) 20 MG tablet Take 20 mg by mouth daily.   Yes [provider]  ferrous sulfate 325 (65 FE) MG tablet Take 325 mg by mouth 2 (two) times daily with a meal.   Yes [provider]  fluticasone (FLONASE SENSIMIST) 27.5 MCG/SPRAY nasal spray Place 1 spray into the nose daily.   Yes [provider]  guaiFENesin-dextromethorphan (ROBITUSSIN DM) 100-10 MG/5ML syrup Take 10 mLs by mouth every 4 (four) hours as needed for cough.   Yes [provider]  levothyroxine (SYNTHROID, LEVOTHROID) 100 MCG tablet Take 100  mcg by mouth daily before breakfast.   Yes [provider]  lidocaine (LIDODERM) 5 % Place 1 patch onto the skin daily. Remove & Discard patch within 12 hours or as directed by MD AT BEDTIME   Yes [provider]  loratadine (CLARITIN) 10 MG tablet Take 10 mg by mouth daily.   Yes [provider]  magnesium hydroxide (MILK OF MAGNESIA) 400 MG/5ML suspension Take 30 mLs by mouth daily as needed for mild constipation.   Yes [provider]  melatonin 5 MG TABS Take 5 mg by mouth at bedtime.   Yes [provider]  metoprolol succinate (TOPROL-XL) 25 MG 24 hr tablet Take 1 tablet (25 mg total) by mouth daily. Patient taking differently: Take 12.5 mg by mouth daily. 12.5m in morning for htn hold for bp 110/60 or HR60 02/10/16  Yes KHerminio Commons MD  mirtazapine (REMERON) 7.5 MG tablet Take 7.5 mg by mouth at bedtime.   Yes [provider]  OLANZapine (ZYPREXA) 2.5 MG tablet Take 1.25 mg by mouth at bedtime.   Yes [provider]  senna (SENOKOT) 8.6 MG tablet Take 2 tablets by mouth daily.   Yes [provider]  tizanidine (ZANAFLEX) 2 MG capsule Take 2 mg by mouth at bedtime.   Yes [provider]    Allergies    Sulfonamide derivatives and Aspirin  Review of Systems   Review of Systems  Constitutional: Negative for chills and fever.  HENT: Negative for ear pain and sore throat.   Eyes: Negative for pain and visual disturbance.  Respiratory: Negative for cough and shortness of breath.   Cardiovascular: Negative for chest pain and palpitations.  Gastrointestinal: Positive for abdominal pain. Negative for vomiting.  Genitourinary: Negative for dysuria and hematuria.  Musculoskeletal: Negative for arthralgias and back pain.  Skin: Negative for color change and rash.  Neurological: Negative for seizures and syncope.  All other systems reviewed and are negative.   Physical Exam Updated Vital Signs BP (!)  83/46   Pulse 92   Temp 98.6 F (37 C) (Oral)   Resp 16   Ht 1.575 m (_0 )   Wt 36.3 kg   SpO2 94%   BMI 14.63 kg/m   Physical Exam Vitals and nursing note reviewed.  Constitutional:      General: She is not in acute distress.    Appearance: She is well-developed.  HENT:     Head: Normocephalic and atraumatic.  Eyes:     Extraocular Movements: Extraocular movements intact.     Conjunctiva/sclera: Conjunctivae normal.     Pupils: Pupils are  equal, round, and reactive to light.  Cardiovascular:     Rate and Rhythm: Normal rate and regular rhythm.     Heart sounds: No murmur heard.   Pulmonary:     Effort: Pulmonary effort is normal. No respiratory distress.     Breath sounds: Normal breath sounds.  Abdominal:     General: There is no distension.     Palpations: Abdomen is soft.     Tenderness: There is abdominal tenderness. There is no guarding.     Comments: Mildly tender diffusely.  No guarding.  Musculoskeletal:     Cervical back: Neck supple.  Skin:    General: Skin is warm and dry.  Neurological:     Mental Status: She is alert.     Comments: Patient awake and is verbal.  Moving all 4 extremities.     ED Results / Procedures / Treatments   Labs (all labs ordered are listed, but only abnormal results are displayed) Labs Reviewed  CBC WITH DIFFERENTIAL/PLATELET - Abnormal; Notable for the following components:      Result Value   RBC 3.37 (*)    Hemoglobin 11.8 (*)    MCV 109.5 (*)    MCH 35.0 (*)    Platelets 89 (*)    Lymphs Abs 0.2 (*)    Monocytes Absolute 0.0 (*)    All other components within normal limits  COMPREHENSIVE METABOLIC PANEL - Abnormal; Notable for the following components:   CO2 21 (*)    Glucose, Bld 172 (*)    BUN 27 (*)    Calcium 8.6 (*)    Albumin 3.1 (*)    AST 897 (*)    ALT 434 (*)    Alkaline Phosphatase 249 (*)    Total Bilirubin 2.4 (*)    All other components within normal limits  LIPASE, BLOOD - Abnormal; Notable  for the following components:   Lipase 472 (*)    All other components within normal limits    EKG EKG Interpretation  Date/Time:  Thursday April 02 2021 11:32:41 EDT Ventricular Rate:  101 PR Interval:  193 QRS Duration: 82 QT Interval:  365 QTC Calculation: 474 R Axis:   49 Text Interpretation: Sinus tachycardia Low voltage, extremity and precordial leads Confirmed by Fredia Sorrow [phone]) on 04/02/2021 11:49:49 AM   Radiology CT ABDOMEN PELVIS WO CONTRAST  Result Date: 04/02/2021 CLINICAL DATA:  Left lower quadrant pain for 2-3 days. EXAM: CT ABDOMEN AND PELVIS WITHOUT CONTRAST TECHNIQUE: Multidetector CT imaging of the abdomen and pelvis was performed following the standard protocol without IV contrast. COMPARISON:  CT angiogram of the chest, abdomen and pelvis 10/09/2017. FINDINGS: Lower chest: Mild cardiomegaly. Small pleural effusions, greater on the left. Dependent atelectasis. Hepatobiliary: No focal liver lesion is identified on this unenhanced exam. Intra and extrahepatic biliary ductal dilatation appears worse than on the prior CT. The gallbladder has been removed. Pancreas: The body and tail the pancreas are markedly atrophic. The head and neck of the pancreas appear swollen with surrounding stranding. Spleen: Normal in size without focal abnormality. Adrenals/Urinary Tract: Abnormal axis of orientation of the right kidney is unchanged. 2-3 cysts are seen in the lower pole the left kidney, also unchanged. The kidneys are otherwise unremarkable. The bladder is markedly distended. No urinary tract stones are seen. Adrenal glands are negative. Stomach/Bowel: Stomach is within normal limits. The appendix is not visualized but no pericecal inflammatory change is seen. No evidence of bowel wall thickening, distention, or inflammatory changes.  Surgical anastomosis in the sigmoid colon is identified as on the prior study. Vascular/Lymphatic: Aortic atherosclerosis. No enlarged abdominal  or pelvic lymph nodes. Reproductive: Status post hysterectomy. No adnexal masses. Other: There is a small volume of perihepatic ascites. Mesenteric edema is noted. No focal fluid collection is seen. Musculoskeletal: Convex left scoliosis and multilevel degenerative change noted. No acute or focal abnormality. IMPRESSION: Intra and extrahepatic biliary ductal dilatation is new since the prior examination. Cause for the obstruction is not seen and could be due to a stone which is occult on CT or possibly pancreatic neoplasm. Right upper quadrant ultrasound could be used to check for an occult common bile duct stone. Ill-defined and edematous appearing head of the pancreas with surrounding stranding is likely due to pancreatitis. Underlying neoplasm is possible. Marked distention of the urinary bladder. Electronically Signed   By: Inge Rise M.D.   On: 04/02/2021 14:55   DG Chest Port 1 View  Result Date: 04/02/2021 CLINICAL DATA:  Chest pain. EXAM: PORTABLE CHEST 1 VIEW COMPARISON:  December 28, 2020. FINDINGS: The heart size and mediastinal contours are within normal limits. No pneumothorax or pleural effusion is noted. Stable interstitial densities are noted throughout both lungs most consistent with scarring. No acute abnormality is noted. Stable right apical scarring is noted. The visualized skeletal structures are unremarkable. IMPRESSION: No active disease. Aortic Atherosclerosis (ICD10-I70.0). Electronically Signed   By: Marijo Conception M.D.   On: 04/02/2021 11:34    Procedures Procedures  CRITICAL CARE Performed by: Fredia Sorrow Total critical care time: 35 minutes Critical care time was exclusive of separately billable procedures and treating other patients. Critical care was necessary to treat or prevent imminent or life-threatening deterioration. Critical care was time spent personally by me on the following activities: development of treatment plan with patient and/or surrogate as well  as nursing, discussions with consultants, evaluation of patient's response to treatment, examination of patient, obtaining history from patient or surrogate, ordering and performing treatments and interventions, ordering and review of laboratory studies, ordering and review of radiographic studies, pulse oximetry and re-evaluation of patient's condition.    Medications Ordered in ED Medications  0.9 %  sodium chloride infusion ( Intravenous New Bag/Given 04/02/21 1130)  sodium chloride 0.9 % bolus 500 mL (500 mLs Intravenous New Bag/Given 04/02/21 1128)  ondansetron (ZOFRAN) injection 4 mg (4 mg Intravenous Given 04/02/21 1129)  HYDROmorphone (DILAUDID) injection 0.5 mg (0.5 mg Intravenous Given 04/02/21 1129)    ED Course  I have reviewed the triage vital signs and the nursing notes.  Pertinent labs & imaging results that were available during my care of the patient were reviewed by me and considered in my medical decision making (see chart for details).    MDM Rules/Calculators/A&P                         Work-up significant for no leukocytosis.  Liver function tests suggestive of significant abnormalities.  AST 897.  ALT 434 alk phos 249 total bili 2.4.  Kidney function normal.  CO2 significant for slightly down at 21.  Glucose slightly elevated 172.  Lipase markedly elevated 472.   CT scan does not see any gallstones.  Significant for intra and extrahepatic biliary ductal dilatation is new since prior exam.  Cause for the obstruction is not seen.  They did recommend getting an ultrasound right upper quadrant to look better at the common bile duct.  Is possible there could be  a pancreatic mass.  Patient will require admission.  Consult into gastroenterology.  As well as the hospitalist.  Also ultrasound limited right upper quadrant ordered to evaluate the common bile duct better.  Forget about correction here patient's had her gallbladder removed.  But we still do not go forward with the  ultrasound to take a look at the ductal system.  In case there is a retained stone.    Final Clinical Impression(s) / ED Diagnoses Final diagnoses:  Generalized abdominal pain  Acute pancreatitis without infection or necrosis, unspecified pancreatitis type    Rx / DC Orders ED Discharge Orders    None       Fredia Sorrow, MD 04/02/21 1510    Fredia Sorrow, MD 04/02/21 1518  Addendum: [place] patient lost her IV.  Currently her blood pressures are in the 70s.  But not tachycardic patient's mental status without any significant change.  Hospitalist contacted for admission.  Nursing will restart proximal IV they have ordered some IV fluids.  See how patient responds.  Obviously if she remains hypotensive she may need central line which could be done by evening emergency physician not required at this time.    Fredia Sorrow, MD 04/02/21 1544

## 2021-04-02 NOTE — ED Triage Notes (Signed)
Pt brought in by EMS from [facility]) for abdominal pain. Pt complaining of LLQ abdominal pain. Pt states she has had the pain for several days, today the pain is "unbareable". Pt last bowel movement was this yesterday, soft loosely formed stool.

## 2021-04-02 NOTE — H&P (Addendum)
TRH H&P   Patient Demographics:    [name], is a 87 y.o. female  MRN: [id number]   DOB - Jun 08, 1925  Admit Date - 04/02/2021  Outpatient Primary MD for the patient is Neale Burly, MD  Referring MD/NP/PA: Dr Rogene Houston  Patient coming from: [facility] Complaint  Patient presents with  . Abdominal Pain      HPI:    [name]  is a 87 y.o. female, with past medical history of COPD, diabetes, GERD, hypothyroidism, PFO, rheumatic heart disease status post mitral valve replacement, remote A. fib that is post Cox-Maze procedure maintaining sinus rhythm(not anymore anticoagulation for few years), patient presents from nursing home due to complaints of abdominal pain, she is awake alert x3, but overall poor historian, report abdominal pain has been there for a while, she does report some weight loss as well, cannot specify how many pounds, reports some nausea, but denies any vomiting, reports poor appetite, denies fever, chills, coffee-ground emesis, change in bowel habits or bright red blood per rectum. - in ED her CT abdomen pelvis significant for intra-/extra biliary ductal dilatation, and pancreatic inflammation, alk phos elevated at 249, AST at 897, ALT at 434, total bilirubin at 2.4 and lipase at 472, she had white blood cell within normal limit at 4.4, hemoglobin 11.8, and platelet of 89 K.  As well patient noted to be hypotensive, triage hospitalist consulted to admit.   Review of systems:    In addition to the HPI above,  No Fever-chills, orts weight loss and poor appetite No Headache, No changes with Vision or hearing, No problems swallowing food or Liquids, No Chest pain, Cough or Shortness of Breath, Planes of abdominal pain and nausea, no Vommitting, Bowel movements are regular, No Blood in stool or Urine, No dysuria, No new skin  rashes or bruises, No new joints pains-aches,  No new weakness, tingling, numbness in any extremity, No recent weight gain or loss, No polyuria, polydypsia or polyphagia, No significant Mental Stressors.  A full 10 point Review of Systems was done, except as stated above, all other Review of Systems were negative.   With Past History of the following :    Past Medical History:  Diagnosis Date  . Arrhythmia    Sinus tachycardia  . Atrial fibrillation (HCC)    status post Cox-Maze procedure maintaining normal sinus rhythm, amiodarone and Coumadin discontinued.  . CHF (congestive heart failure) (HCC)    normal LV systolic function  . COPD (chronic obstructive pulmonary disease) ([place])   . Degenerative lumbar spinal stenosis    Severe degenerative disk disease of lumbosacral spine  . Diabetes ([place])   . GERD (gastroesophageal reflux disease)   . Hypothyroidism   . PFO (patent foramen ovale)    status post PFO closure at time of surgery  . Rheumatic heart disease    s/p  right thoracotomy and mitral valve replacement with Medtronic Mosaic porcine tissue valve  . Syncope    recurrent unexplained syncope      Past Surgical History:  Procedure Laterality Date  . EP IMPLANTABLE DEVICE N/A 06/27/2015   MDT LINQ ILR implanted by Dr Rayann Heman for unexplained syncope  . EXPLORATORY LAPAROTOMY    . FOOT SURGERY     left foot  . MITRAL VALVE REPLACEMENT    . PERCUTANEOUS BALLOON VALVULOPLASTY  1993   Mitral valve      Social History:     Social History   Tobacco Use  . Smoking status: Former Smoker    Packs/day: 0.30    Years: 18.00    Pack years: 5.40    Types: Cigarettes    Start date: 05/26/1977    Quit date: 12/20/1988    Years since quitting: 32.3  . Smokeless tobacco: Never Used  Substance Use Topics  . Alcohol use: No    Alcohol/week: 0.0 standard drinks      Family History :     Family History  Problem Relation Age of Onset  . Heart disease Father   . Heart  attack Father   . Heart disease Brother   . Heart attack Brother   . Heart attack Son   . Heart disease Son   . Coronary artery disease Neg Hx   . Diabetes Neg Hx   . Hypertension Neg Hx       Home Medications:   Prior to Admission medications   Medication Sig Start Date End Date Taking? Authorizing Provider  acetaminophen (TYLENOL) 500 MG tablet Take 500 mg by mouth every 6 (six) hours as needed.   Yes [provider]  cholecalciferol (VITAMIN D) 25 MCG (1000 UNIT) tablet Take 1 tablet by mouth daily.   Yes [provider]  divalproex (DEPAKOTE) 125 MG DR tablet Take 125 mg by mouth 3 (three) times daily.   Yes [provider]  famotidine (PEPCID) 20 MG tablet Take 20 mg by mouth daily.   Yes [provider]  ferrous sulfate 325 (65 FE) MG tablet Take 325 mg by mouth 2 (two) times daily with a meal.   Yes [provider]  fluticasone (FLONASE SENSIMIST) 27.5 MCG/SPRAY nasal spray Place 1 spray into the nose daily.   Yes [provider]  guaiFENesin-dextromethorphan (ROBITUSSIN DM) 100-10 MG/5ML syrup Take 10 mLs by mouth every 4 (four) hours as needed for cough.   Yes [provider]  levothyroxine (SYNTHROID, LEVOTHROID) 100 MCG tablet Take 100 mcg by mouth daily before breakfast.   Yes [provider]  lidocaine (LIDODERM) 5 % Place 1 patch onto the skin daily. Remove & Discard patch within 12 hours or as directed by MD AT BEDTIME   Yes [provider]  loratadine (CLARITIN) 10 MG tablet Take 10 mg by mouth daily.   Yes [provider]  magnesium hydroxide (MILK OF MAGNESIA) 400 MG/5ML suspension Take 30 mLs by mouth daily as needed for mild constipation.   Yes [provider]  melatonin 5 MG TABS Take 5 mg by mouth at bedtime.   Yes [provider]  metoprolol succinate (TOPROL-XL) 25 MG 24 hr tablet Take 1 tablet (25 mg total) by mouth daily. Patient taking differently: Take  12.5 mg by mouth daily. 12.$RemoveBeforeD'5mg'LxROJULAYdZLvj$  in morning for htn hold for bp 110/60 or HR60 02/10/16  Yes Herminio Commons, MD  mirtazapine (REMERON) 7.5 MG tablet Take 7.5 mg by  mouth at bedtime.   Yes [provider]  OLANZapine (ZYPREXA) 2.5 MG tablet Take 1.25 mg by mouth at bedtime.   Yes [provider]  senna (SENOKOT) 8.6 MG tablet Take 2 tablets by mouth daily.   Yes [provider]  tizanidine (ZANAFLEX) 2 MG capsule Take 2 mg by mouth at bedtime.   Yes [provider]     Allergies:     Allergies  Allergen Reactions  . Sulfonamide Derivatives Anaphylaxis, Shortness Of Breath and Swelling  . Aspirin     Uncoated Aspirin = stomach upset/pain EC Aspirin is ok     Physical Exam:   Vitals  Blood pressure (!) 83/46, pulse 92, temperature 98.6 F (37 C), temperature source Oral, resp. rate 16, height $RemoveBe'5\' 2"'fDAFklsHV$  (1.575 m), weight 36.3 kg, SpO2 94 %.   1. General frail, extremely cachectic and thin appearing female, laying in bed in mild discomfort due to pain  2. Normal affect and insight, Not Suicidal or Homicidal, Awake Alert, Oriented X 3.  3. No F.N deficits, ALL C.Nerves Intact, Strength 5/5 all 4 extremities, Sensation intact all 4 extremities, Plantars down going.  4. Ears  appear Normal, Conjunctivae clear, mildly icteric sclera PERRLA.  Dry oral Mucosa.  5. Supple Neck, No JVD, No cervical lymphadenopathy appriciated, No Carotid Bruits.  6. Symmetrical Chest wall movement, Good air movement bilaterally, CTAB.  7. RRR, No Gallops, Rubs or Murmurs, No Parasternal Heave.  8. Positive Bowel Sounds, abdomen with significant generalized tenderness, but significantly tender in the left epigastric area, No organomegaly appriciated,No rebound -guarding or rigidity.  9.  No Cyanosis, delayed l Skin Turgor, No Skin Rash or Bruise.  10. Good muscle tone,  joints appear normal , no effusions, Normal ROM.  11. No Palpable Lymph Nodes in Neck or  Axillae    Data Review:    CBC Recent Labs  Lab 04/02/21 1218  WBC 4.4  HGB 11.8*  HCT 36.9  PLT 89*  MCV 109.5*  MCH 35.0*  MCHC 32.0  RDW 13.3  LYMPHSABS 0.2*  MONOABS 0.0*  EOSABS 0.0  BASOSABS 0.0   ------------------------------------------------------------------------------------------------------------------  Chemistries  Recent Labs  Lab 04/02/21 1218  NA 136  K 4.0  CL 100  CO2 21*  GLUCOSE 172*  BUN 27*  CREATININE 0.63  CALCIUM 8.6*  AST 897*  ALT 434*  ALKPHOS 249*  BILITOT 2.4*   ------------------------------------------------------------------------------------------------------------------ estimated creatinine clearance is 25.2 mL/min (by C-G formula based on SCr of 0.63 mg/dL). ------------------------------------------------------------------------------------------------------------------ No results for input(s): TSH, T4TOTAL, T3FREE, THYROIDAB in the last 72 hours.  Invalid input(s): FREET3  Coagulation profile No results for input(s): INR, PROTIME in the last 168 hours. ------------------------------------------------------------------------------------------------------------------- No results for input(s): DDIMER in the last 72 hours. -------------------------------------------------------------------------------------------------------------------  Cardiac Enzymes No results for input(s): CKMB, TROPONINI, MYOGLOBIN in the last 168 hours.  Invalid input(s): CK ------------------------------------------------------------------------------------------------------------------ No results found for: BNP   ---------------------------------------------------------------------------------------------------------------  Urinalysis    Component Value Date/Time   COLORURINE YELLOW 05/20/2009 1302   APPEARANCEUR CLOUDY (A) 05/20/2009 1302   LABSPEC 1.016 05/20/2009 1302   PHURINE 7.0 05/20/2009 1302   GLUCOSEU NEGATIVE 05/20/2009 1302    HGBUR NEGATIVE 05/20/2009 1302   BILIRUBINUR NEGATIVE 05/20/2009 1302   KETONESUR NEGATIVE 05/20/2009 1302   PROTEINUR NEGATIVE 05/20/2009 1302   UROBILINOGEN 1.0 05/20/2009 1302   NITRITE NEGATIVE 05/20/2009 1302   LEUKOCYTESUR LARGE (A) 05/20/2009 1302    ----------------------------------------------------------------------------------------------------------------   Imaging Results:    CT ABDOMEN PELVIS WO  CONTRAST  Result Date: 04/02/2021 CLINICAL DATA:  Left lower quadrant pain for 2-3 days. EXAM: CT ABDOMEN AND PELVIS WITHOUT CONTRAST TECHNIQUE: Multidetector CT imaging of the abdomen and pelvis was performed following the standard protocol without IV contrast. COMPARISON:  CT angiogram of the chest, abdomen and pelvis 10/09/2017. FINDINGS: Lower chest: Mild cardiomegaly. Small pleural effusions, greater on the left. Dependent atelectasis. Hepatobiliary: No focal liver lesion is identified on this unenhanced exam. Intra and extrahepatic biliary ductal dilatation appears worse than on the prior CT. The gallbladder has been removed. Pancreas: The body and tail the pancreas are markedly atrophic. The head and neck of the pancreas appear swollen with surrounding stranding. Spleen: Normal in size without focal abnormality. Adrenals/Urinary Tract: Abnormal axis of orientation of the right kidney is unchanged. 2-3 cysts are seen in the lower pole the left kidney, also unchanged. The kidneys are otherwise unremarkable. The bladder is markedly distended. No urinary tract stones are seen. Adrenal glands are negative. Stomach/Bowel: Stomach is within normal limits. The appendix is not visualized but no pericecal inflammatory change is seen. No evidence of bowel wall thickening, distention, or inflammatory changes. Surgical anastomosis in the sigmoid colon is identified as on the prior study. Vascular/Lymphatic: Aortic atherosclerosis. No enlarged abdominal or pelvic lymph nodes. Reproductive: Status  post hysterectomy. No adnexal masses. Other: There is a small volume of perihepatic ascites. Mesenteric edema is noted. No focal fluid collection is seen. Musculoskeletal: Convex left scoliosis and multilevel degenerative change noted. No acute or focal abnormality. IMPRESSION: Intra and extrahepatic biliary ductal dilatation is new since the prior examination. Cause for the obstruction is not seen and could be due to a stone which is occult on CT or possibly pancreatic neoplasm. Right upper quadrant ultrasound could be used to check for an occult common bile duct stone. Ill-defined and edematous appearing head of the pancreas with surrounding stranding is likely due to pancreatitis. Underlying neoplasm is possible. Marked distention of the urinary bladder. Electronically Signed   By: Inge Rise M.D.   On: 04/02/2021 14:55   DG Chest Port 1 View  Result Date: 04/02/2021 CLINICAL DATA:  Chest pain. EXAM: PORTABLE CHEST 1 VIEW COMPARISON:  December 28, 2020. FINDINGS: The heart size and mediastinal contours are within normal limits. No pneumothorax or pleural effusion is noted. Stable interstitial densities are noted throughout both lungs most consistent with scarring. No acute abnormality is noted. Stable right apical scarring is noted. The visualized skeletal structures are unremarkable. IMPRESSION: No active disease. Aortic Atherosclerosis (ICD10-I70.0). Electronically Signed   By: Marijo Conception M.D.   On: 04/02/2021 11:34    My personal review of EKG: Rhythm NSR, Rate  101/min, QTc 474 , no Acute ST changes   Assessment & Plan:    Active Problems:   RHEUMATIC HEART DISEASE   MITRAL VALVE REPLACEMENT, HX OF   GERD (gastroesophageal reflux disease)   COPD (chronic obstructive pulmonary disease) (HCC)   Hypothyroidism   Acute pancreatitis   Acute pancreatitis -Likely due to common bile duct obstruction, please see the under elevated LFTs. -Patient's blood pressure currently on the lower  side, she will be resuscitated with fluid boluses, will aim for pain control once blood pressure has been stabilized. -We will keep n.p.o., will keep on aggressive IV fluids hydration , check lipid panels in a.m.Marland Kitchen -GI has been consulted.  Transaminitis/hyperbilirubinemia -This is most likely due to obstructive etiology, as evidence of intra and extrahepatic.  Ductal dilation, etiology could not be identified, stone  versus pancreatic neoplasm. -We will start with right upper quadrant ultrasound to check for occult common bile duct stone causing her obstruction, and if no evidence of common bile duct stone then will proceed with MRCP. -Continue to trend LFT. -GI has been consulted  Hypotension -We will check lactic acid, will obtain blood cultures and start empirically on antibiotics pending further work-up -IV fluid resuscitation, if no improvement then will need to start on pressors.  History of hypertension -Hold metoprolol due to above  Hypothyroidism - continue with synthroid  Thrombocytopenia -Continue with SCD for DVT prophylaxis, will await ultrasound for further evaluation of the liver .  GERD - continue with pepcid  Diabetes mellitus -Patient with history of diabetes mellitus, but does not appear to be on any hypoglycemic agents, will monitor CBG for now .  Chronic back pain  -Continue with Depakote and Zanaflex   PCM with  BMI of 14.6 -We will consult nutritionist when able to tolerate oral   DVT Prophylaxis SCD  AM Labs Ordered, also please review Full Orders  Family Communication: Admission, patients condition and plan of care including tests being ordered have been discussed with the patient son Blanca Friend [phone] a who indicate understanding and agree with the plan and Code Status.  Code Status DNR, has a DNR form in the chart  Likely DC to  Back to SNF([facility] in [place])  Condition GUARDED    Consults called: GI    Admission status: inpatient     Time spent in minutes : 65 minutes   Phillips Climes M.D on 04/02/2021 at 3:38 PM   [facility] - Office  [phone]

## 2021-04-03 ENCOUNTER — Inpatient Hospital Stay (HOSPITAL_COMMUNITY): Payer: Medicare Other

## 2021-04-03 DIAGNOSIS — R109 Unspecified abdominal pain: Secondary | ICD-10-CM

## 2021-04-03 DIAGNOSIS — K851 Biliary acute pancreatitis without necrosis or infection: Secondary | ICD-10-CM | POA: Diagnosis not present

## 2021-04-03 DIAGNOSIS — R6521 Severe sepsis with septic shock: Secondary | ICD-10-CM

## 2021-04-03 DIAGNOSIS — A419 Sepsis, unspecified organism: Principal | ICD-10-CM

## 2021-04-03 DIAGNOSIS — K8309 Other cholangitis: Secondary | ICD-10-CM | POA: Diagnosis not present

## 2021-04-03 DIAGNOSIS — K219 Gastro-esophageal reflux disease without esophagitis: Secondary | ICD-10-CM

## 2021-04-03 DIAGNOSIS — R1084 Generalized abdominal pain: Principal | ICD-10-CM

## 2021-04-03 LAB — GLUCOSE, CAPILLARY
Glucose-Capillary: 130 mg/dL — ABNORMAL HIGH (ref 70–99)
Glucose-Capillary: 42 mg/dL — CL (ref 70–99)
Glucose-Capillary: 71 mg/dL (ref 70–99)

## 2021-04-03 LAB — C-REACTIVE PROTEIN: CRP: 16.4 mg/dL — ABNORMAL HIGH (ref <1.0)

## 2021-04-03 LAB — CBC
HCT: 32.8 % — ABNORMAL LOW (ref 36.0–46.0)
Hemoglobin: 10.4 g/dL — ABNORMAL LOW (ref 12.0–15.0)
MCH: 34.7 pg — ABNORMAL HIGH (ref 26.0–34.0)
MCHC: 31.7 g/dL (ref 30.0–36.0)
MCV: 109.3 fL — ABNORMAL HIGH (ref 80.0–100.0)
Platelets: 86 10*3/uL — ABNORMAL LOW (ref 150–400)
RBC: 3 MIL/uL — ABNORMAL LOW (ref 3.87–5.11)
RDW: 13.4 % (ref 11.5–15.5)
WBC: 21.1 10*3/uL — ABNORMAL HIGH (ref 4.0–10.5)
nRBC: 0 % (ref 0.0–0.2)

## 2021-04-03 LAB — BASIC METABOLIC PANEL
Anion gap: 9 (ref 5–15)
BUN: 26 mg/dL — ABNORMAL HIGH (ref 8–23)
CO2: 23 mmol/L (ref 22–32)
Calcium: 7.9 mg/dL — ABNORMAL LOW (ref 8.9–10.3)
Chloride: 108 mmol/L (ref 98–111)
Creatinine, Ser: 0.54 mg/dL (ref 0.44–1.00)
GFR, Estimated: >60 mL/min (ref >60)
Glucose, Bld: 78 mg/dL (ref 70–99)
Potassium: 4.4 mmol/L (ref 3.5–5.1)
Sodium: 140 mmol/L (ref 135–145)

## 2021-04-03 LAB — MRSA PCR SCREENING: MRSA by PCR: NEGATIVE

## 2021-04-03 LAB — HEPATIC FUNCTION PANEL
ALT: 270 U/L — ABNORMAL HIGH (ref 0–44)
AST: 385 U/L — ABNORMAL HIGH (ref 15–41)
Albumin: 2.4 g/dL — ABNORMAL LOW (ref 3.5–5.0)
Alkaline Phosphatase: 159 U/L — ABNORMAL HIGH (ref 38–126)
Bilirubin, Direct: 0.8 mg/dL — ABNORMAL HIGH (ref 0.0–0.2)
Indirect Bilirubin: 0.5 mg/dL (ref 0.3–0.9)
Total Bilirubin: 1.3 mg/dL — ABNORMAL HIGH (ref 0.3–1.2)
Total Protein: 5.6 g/dL — ABNORMAL LOW (ref 6.5–8.1)

## 2021-04-03 LAB — PROTIME-INR
INR: 1.6 — ABNORMAL HIGH (ref 0.8–1.2)
Prothrombin Time: 19 seconds — ABNORMAL HIGH (ref 11.4–15.2)

## 2021-04-03 LAB — PROCALCITONIN: Procalcitonin: 7.63 ng/mL

## 2021-04-03 LAB — LACTIC ACID, PLASMA: Lactic Acid, Venous: 1.6 mmol/L (ref 0.5–1.9)

## 2021-04-03 LAB — LIPASE, BLOOD: Lipase: 112 U/L — ABNORMAL HIGH (ref 11–51)

## 2021-04-03 MED ORDER — DEXTROSE 50 % IV SOLN
25.0000 g | INTRAVENOUS | Status: AC
  Administered 2021-04-04: 25 g via INTRAVENOUS
  Filled 2021-04-03: qty 50

## 2021-04-03 MED ORDER — DEXTROSE 50 % IV SOLN
INTRAVENOUS | Status: AC
  Administered 2021-04-03: 50 mL
  Filled 2021-04-03: qty 50

## 2021-04-03 MED ORDER — CHLORHEXIDINE GLUCONATE CLOTH 2 % EX PADS
6.0000 | MEDICATED_PAD | Freq: Every day | CUTANEOUS | Status: DC
  Administered 2021-04-03 – 2021-04-05 (×3): 6 via TOPICAL

## 2021-04-03 MED ORDER — LACTATED RINGERS IV SOLN: INTRAVENOUS | Status: DC

## 2021-04-03 NOTE — Progress Notes (Signed)
[facility] picked up patient for transfer to [facility]. Report given to [facility]M nurse.

## 2021-04-03 NOTE — Plan of Care (Signed)
  Problem: Acute Rehab PT Goals(only PT should resolve) Goal: Pt Will Go Supine/Side To Sit Outcome: Progressing Flowsheets (Taken 04/03/2021 1225) Pt will go Supine/Side to Sit: with min guard assist Goal: Pt Will Go Sit To Supine/Side Outcome: Progressing Flowsheets (Taken 04/03/2021 1225) Pt will go Sit to Supine/Side: with min guard assist Goal: Patient Will Transfer Sit To/From Stand Outcome: Progressing Flowsheets (Taken 04/03/2021 1225) Patient will transfer sit to/from stand: with minimal assist Goal: Pt Will Transfer Bed To Chair/Chair To Bed Outcome: Progressing Flowsheets (Taken 04/03/2021 1225) Pt will Transfer Bed to Chair/Chair to Bed: with min assist Goal: Pt Will Ambulate Outcome: Progressing Flowsheets (Taken 04/03/2021 1225) Pt will Ambulate:  15 feet  with least restrictive assistive device  with minimal assist Goal: Pt/caregiver will Perform Home Exercise Program Outcome: Progressing Flowsheets (Taken 04/03/2021 1225) Pt/caregiver will Perform Home Exercise Program:  For increased strengthening  For improved balance  With Supervision, verbal cues required/provided  12:26 PM, 04/03/21 Wyman Songster PT, DPT Physical Therapist at [facility]

## 2021-04-03 NOTE — Progress Notes (Signed)
PROGRESS NOTE   [name]  GBE:[id number] DOB: 1925-12-05 DOA: 04/02/2021 PCP: Neale Burly, MD   Chief Complaint  Patient presents with  . Abdominal Pain   Level of care: ICU  Brief Admission History:  87 y.o. female, with past medical history of COPD, diabetes, GERD, hypothyroidism, PFO, rheumatic heart disease status post mitral valve replacement, remote A. fib that is post Cox-Maze procedure maintaining sinus rhythm(not anymore anticoagulation for few years), patient presents from nursing home due to complaints of abdominal pain, she is awake alert x3, but overall poor historian, report abdominal pain has been there for a while, she does report some weight loss as well, cannot specify how many pounds, reports some nausea, but denies any vomiting, reports poor appetite, denies fever, chills, coffee-ground emesis, change in bowel habits or bright red blood per rectum. - in ED her CT abdomen pelvis significant for intra-/extra biliary ductal dilatation, and pancreatic inflammation, alk phos elevated at 249, AST at 897, ALT at 434, total bilirubin at 2.4 and lipase at 472, she had white blood cell within normal limit at 4.4, hemoglobin 11.8, and platelet of 89 K.  As well patient noted to be hypotensive, triage hospitalist consulted to admit.   Assessment & Plan:   Active Problems:   RHEUMATIC HEART DISEASE   MITRAL VALVE REPLACEMENT, HX OF   GERD (gastroesophageal reflux disease)   COPD (chronic obstructive pulmonary disease) (HCC)   Hypothyroidism   Acute pancreatitis   1. Severe sepsis with septic shock secondary to intra-abdominal infection - Increasing procalcitonin and WBC is concerning but lactic acid trending down, continue IV zosyn, continue IV fluids, norepinephrine IV and supportive measures.   2. Acute pancreatitis with possible pancreatic mass - Continue supportive measures and pain management for now.   3. Transaminitis /  Hyperbilirubinemia - We have been  unable to rule out obstruction at this time. MRCP was cancelled by radiology due to hardware in patient's body.  Fortunately LFTs and bilirubin trending down.  4. Hypotension - improved on IV norepinephrine infusion, wean as able.   5. DNR present on admission - continue DNR order in hospital.  Early palliative care involvement can be beneficial in this case and consult was requested.   6. GERD - remains on pepcid. 7. Diet controlled diabetes mellitus - following CBG during acute critical illness.  8. Chronic back pain - IV pain management for now.  9. Abnormal weight loss - worrisome for underlying malignancy - dietitian consultation.  10. Severe protein calorie malnutrition - see above.    DVT prophylaxis: SCD Code Status: DNR  Family Communication: none present  Disposition: TBD Status is: Inpatient  Remains inpatient appropriate because:IV treatments appropriate due to intensity of illness or inability to take PO and Inpatient level of care appropriate due to severity of illness   Dispo: The patient is from: SNF              Anticipated d/c is to: SNF              Patient currently is not medically stable to d/c.   Difficult to place patient No   Consultants:   GI   Procedures:     Antimicrobials:  Zosyn 4/14>>   Subjective: Pt reports that she has severe epigastric abdominal pain that comes in waves. She has nausea and does not have desire to eat.   Objective: Vitals:   04/03/21 0600 04/03/21 0700 04/03/21 0740 04/03/21 0800  BP: (!) 108/39 Marland Kitchen)  116/36  (!) 102/53  Pulse: 73 70    Resp: $Remo'15 14  16  'toSUV$ Temp:   (!) 96.9 F (36.1 C)   TempSrc:   Axillary   SpO2: 93% 94%    Weight:      Height:        Intake/Output Summary (Last 24 hours) at 04/03/2021 1046 Last data filed at 04/03/2021 0955 Gross per 24 hour  Intake 4243.34 ml  Output 950 ml  Net 3293.34 ml   Filed Weights   04/02/21 1035 04/02/21 1839  Weight: 36.3 kg 39.8 kg    Examination:  General exam:  frail elderly female, awake, alert, oriented x 3, Appears calm but uncomfortable  Respiratory system: Clear to auscultation. Respiratory effort normal. Cardiovascular system: normal S1 & S2 heard. No JVD, murmurs, rubs, gallops or clicks. No pedal edema. Gastrointestinal system: Abdomen is nondistended, soft and diffusely tender to light palpation in epigastrium with light bowel sounds heard.  Central nervous system: Alert and oriented. No focal neurological deficits. Extremities: Symmetric 5 x 5 power. Skin: No rashes, lesions or ulcers Psychiatry: Judgement and insight appear normal. Mood & affect appropriate.   Data Reviewed: I have personally reviewed following labs and imaging studies  CBC: Recent Labs  Lab 04/02/21 1218 04/03/21 0515  WBC 4.4 21.1*  NEUTROABS 3.5  --   HGB 11.8* 10.4*  HCT 36.9 32.8*  MCV 109.5* 109.3*  PLT 89* 86*    Basic Metabolic Panel: Recent Labs  Lab 04/02/21 1218 04/03/21 0515  NA 136 140  K 4.0 4.4  CL 100 108  CO2 21* 23  GLUCOSE 172* 78  BUN 27* 26*  CREATININE 0.63 0.54  CALCIUM 8.6* 7.9*    GFR: Estimated Creatinine Clearance: 27.6 mL/min (by C-G formula based on SCr of 0.54 mg/dL).  Liver Function Tests: Recent Labs  Lab 04/02/21 1218 04/03/21 0515  AST 897* 385*  ALT 434* 270*  ALKPHOS 249* 159*  BILITOT 2.4* 1.3*  PROT 6.6 5.6*  ALBUMIN 3.1* 2.4*    CBG: No results for input(s): GLUCAP in the last 168 hours.  Recent Results (from the past 240 hour(s))  Culture, blood (routine x 2)     Status: None (Preliminary result)   Collection Time: 04/02/21  6:04 PM   Specimen: BLOOD LEFT HAND  Result Value Ref Range Status   Specimen Description BLOOD LEFT HAND  Final   Special Requests   Final    BOTTLES DRAWN AEROBIC ONLY Blood Culture results may not be optimal due to an inadequate volume of blood received in culture bottles   Culture   Final    NO GROWTH < 24 HOURS Performed at [facility], [address],  [place], [place] [address]    Report Status PENDING  Incomplete  MRSA PCR Screening     Status: None   Collection Time: 04/02/21  6:48 PM   Specimen: Nasal Mucosa; Nasopharyngeal  Result Value Ref Range Status   MRSA by PCR NEGATIVE NEGATIVE Final    Comment:        The GeneXpert MRSA Assay (FDA approved for NASAL specimens only), is one component of a comprehensive MRSA colonization surveillance program. It is not intended to diagnose MRSA infection nor to guide or monitor treatment for MRSA infections. Performed at [facility], [address] [place], [place] [address]   Culture, blood (routine x 2)     Status: None (Preliminary result)   Collection Time: 04/02/21  8:12 PM   Specimen:  BLOOD LEFT HAND  Result Value Ref Range Status   Specimen Description   Final    BLOOD LEFT HAND Performed at [facility], [address] [place], [place] [address]    Special Requests   Final    BOTTLES DRAWN AEROBIC ONLY Blood Culture adequate volume   Culture   Final    NO GROWTH < 12 HOURS Performed at [facility], [address] [place], [place] [address]    Report Status PENDING  Incomplete     Radiology Studies: CT ABDOMEN PELVIS WO CONTRAST  Result Date: 04/02/2021 CLINICAL DATA:  Left lower quadrant pain for 2-3 days. EXAM: CT ABDOMEN AND PELVIS WITHOUT CONTRAST TECHNIQUE: Multidetector CT imaging of the abdomen and pelvis was performed following the standard protocol without IV contrast. COMPARISON:  CT angiogram of the chest, abdomen and pelvis 10/09/2017. FINDINGS: Lower chest: Mild cardiomegaly. Small pleural effusions, greater on the left. Dependent atelectasis. Hepatobiliary: No focal liver lesion is identified on this unenhanced exam. Intra and extrahepatic biliary ductal dilatation appears worse than on the prior CT. The gallbladder has been removed. Pancreas: The body and tail the pancreas are markedly atrophic. The head and neck of the pancreas appear swollen with  surrounding stranding. Spleen: Normal in size without focal abnormality. Adrenals/Urinary Tract: Abnormal axis of orientation of the right kidney is unchanged. 2-3 cysts are seen in the lower pole the left kidney, also unchanged. The kidneys are otherwise unremarkable. The bladder is markedly distended. No urinary tract stones are seen. Adrenal glands are negative. Stomach/Bowel: Stomach is within normal limits. The appendix is not visualized but no pericecal inflammatory change is seen. No evidence of bowel wall thickening, distention, or inflammatory changes. Surgical anastomosis in the sigmoid colon is identified as on the prior study. Vascular/Lymphatic: Aortic atherosclerosis. No enlarged abdominal or pelvic lymph nodes. Reproductive: Status post hysterectomy. No adnexal masses. Other: There is a small volume of perihepatic ascites. Mesenteric edema is noted. No focal fluid collection is seen. Musculoskeletal: Convex left scoliosis and multilevel degenerative change noted. No acute or focal abnormality. IMPRESSION: Intra and extrahepatic biliary ductal dilatation is new since the prior examination. Cause for the obstruction is not seen and could be due to a stone which is occult on CT or possibly pancreatic neoplasm. Right upper quadrant ultrasound could be used to check for an occult common bile duct stone. Ill-defined and edematous appearing head of the pancreas with surrounding stranding is likely due to pancreatitis. Underlying neoplasm is possible. Marked distention of the urinary bladder. Electronically Signed   By: Inge Rise M.D.   On: 04/02/2021 14:55   US Abdomen Complete  Result Date: 04/02/2021 CLINICAL DATA:  Possible biliary obstruction, abdominal pain for 2-3 days. Status post cholecystectomy. EXAM: ABDOMEN ULTRASOUND COMPLETE COMPARISON:  CT abdomen pelvis 04/02/2021 FINDINGS: Gallbladder: Status post cholecystectomy. Common bile duct: Diameter: 15 mm. Liver: No focal lesion  identified. Within normal limits in parenchymal echogenicity. Portal vein is patent on color Doppler imaging with normal direction of blood flow towards the liver. IVC: No abnormality visualized. Pancreas: Visualized portion unremarkable. Spleen: Size and appearance within normal limits. Right Kidney: Length: 9.9 cm. Cortical thinning and scarring. Echogenicity within normal limits. No mass or hydronephrosis visualized. Left Kidney: Length: 9.5 cm. There is a 2.3 x 1.6 x 2.1 cm cystic lesion within the left kidney. There is another 4.3 x 3.4 x 3.4 cm cystic lesion. Echogenicity within normal limits. No mass or hydronephrosis visualized. Abdominal aorta: No aneurysm visualized. Other  findings: None. IMPRESSION: Common bile duct dilatation in a patient status post cholecystectomy. Given CBD measures up to 15 mm, consider MRCP if clinically indicated. Electronically Signed   By: Iven Finn M.D.   On: 04/02/2021 17:15   DG Chest Port 1 View  Result Date: 04/02/2021 CLINICAL DATA:  Chest pain. EXAM: PORTABLE CHEST 1 VIEW COMPARISON:  December 28, 2020. FINDINGS: The heart size and mediastinal contours are within normal limits. No pneumothorax or pleural effusion is noted. Stable interstitial densities are noted throughout both lungs most consistent with scarring. No acute abnormality is noted. Stable right apical scarring is noted. The visualized skeletal structures are unremarkable. IMPRESSION: No active disease. Aortic Atherosclerosis (ICD10-I70.0). Electronically Signed   By: Marijo Conception M.D.   On: 04/02/2021 11:34    Scheduled Meds: . Chlorhexidine Gluconate Cloth  6 each Topical Daily  . divalproex  125 mg Oral TID  . famotidine  20 mg Oral Daily  . levothyroxine  100 mcg Oral QAC breakfast  . sodium chloride flush  3 mL Intravenous Q12H  . tiZANidine  2 mg Oral QHS   Continuous Infusions: . sodium chloride 100 mL/hr at 04/03/21 0955  . norepinephrine (LEVOPHED) Adult infusion 3 mcg/min  (04/03/21 0955)  . piperacillin-tazobactam (ZOSYN)  IV 12.5 mL/hr at 04/03/21 0955     LOS: 1 day   Critical Care Procedure Note Authorized and Performed by: Murvin Natal MD  Total Critical Care time:  55 mins Due to a high probability of clinically significant, life threatening deterioration, the patient required my highest level of preparedness to intervene emergently and I personally spent this critical care time directly and personally managing the patient.  This critical care time included obtaining a history; examining the patient, pulse oximetry; ordering and review of studies; arranging urgent treatment with development of a management plan; evaluation of patient's response of treatment; frequent reassessment; and discussions with other providers.  This critical care time was performed to assess and manage the high probability of imminent and life threatening deterioration that could result in multi-organ failure.  It was exclusive of separately billable procedures and treating other patients and teaching time.    Irwin Brakeman, MD How to contact the [facility] Attending or Consulting provider [place] or covering provider during after hours [place], for this patient?  1. Check the care team in [facility] and look for a) attending/consulting TRH provider listed and b) the [facility] team listed 2. Log into [email or web address] and use 's universal password to access. If you do not have the password, please contact the hospital operator. 3. Locate the [facility] provider you are looking for under [facility] and page to a number that you can be directly reached. 4. If you still have difficulty reaching the provider, please page the [facility] (Director on Call) for the Hospitalists listed on amion for assistance.  04/03/2021, 10:46 AM   '

## 2021-04-03 NOTE — Evaluation (Signed)
Physical Therapy Evaluation Patient Details Name: [name] MRN: [id number] DOB: 1925-07-20 Today's Date: 04/03/2021   History of Present Illness  [name]  is a 87 y.o. female, with past medical history of COPD, diabetes, GERD, hypothyroidism, PFO, rheumatic heart disease status post mitral valve replacement, remote A. fib that is post Cox-Maze procedure maintaining sinus rhythm(not anymore anticoagulation for few years), patient presents from nursing home due to complaints of abdominal pain, she is awake alert x3, but overall poor historian, report abdominal pain has been there for a while, she does report some weight loss as well, cannot specify how many pounds, reports some nausea, but denies any vomiting, reports poor appetite, denies fever, chills, coffee-ground emesis, change in bowel habits or bright red blood per rectum.    Clinical Impression  Patient limited for functional mobility as stated below secondary to BLE weakness, fatigue and poor standing balance. Patient requires min assist to fully upright trunk with transition to EOB. She demonstrates good sitting tolerance and sitting balance EOB. Patient transfers to standing from elevated ICU bed with RW and assist for LE weakness. Patient limited to several shuffled steps at bedside and is limited by fatigue and impaired activity tolerance. Patient seated in chair at bedside at end of session with RN present.  Patient will benefit from continued physical therapy in hospital and recommended venue below to increase strength, balance, endurance for safe ADLs and gait.     Follow Up Recommendations SNF    Equipment Recommendations  None recommended by PT    Recommendations for Other Services       Precautions / Restrictions Precautions Precautions: Fall Restrictions Weight Bearing Restrictions: No      Mobility  Bed Mobility Overal bed mobility: Needs Assistance Bed Mobility: Supine to Sit     Supine to  sit: Min assist;HOB elevated     General bed mobility comments: slow, labored transition to seated EOB requiring assist to upright trunk    Transfers Overall transfer level: Needs assistance Equipment used: Rolling walker (2 wheeled) Transfers: Sit to/from [employer] Sit to Stand: Min assist;Mod assist;From elevated surface Stand pivot transfers: Min assist;Mod assist       General transfer comment: transfer to standing from elevated bed with RW and min/mod assist  Ambulation/Gait Ambulation/Gait assistance: Min assist Gait Distance (Feet): 3 Feet Assistive device: Rolling walker (2 wheeled) Gait Pattern/deviations: Shuffle Gait velocity: decreased   General Gait Details: slow, labored cadence with RW  Stairs            Wheelchair Mobility    Modified Rankin (Stroke Patients Only)       Balance Overall balance assessment: Needs assistance Sitting-balance support: No upper extremity supported Sitting balance-Leahy Scale: Good Sitting balance - Comments: seated EOB   Standing balance support: Bilateral upper extremity supported Standing balance-Leahy Scale: Fair Standing balance comment: fair/poor with RW                             Pertinent Vitals/Pain Pain Assessment: Faces Faces Pain Scale: Hurts little more Pain Location: abdominal pain Pain Descriptors / Indicators: Sore Pain Intervention(s): Limited activity within patient's tolerance;Monitored during session;Repositioned    Home Living Family/patient expects to be discharged to:: Skilled nursing facility                      Prior Function Level of Independence: Needs assistance   Gait / Transfers Assistance Needed:  Patient states household ambulation with rollator  ADL's / Homemaking Assistance Needed: recieves assist        Hand Dominance        Extremity/Trunk Assessment   Upper Extremity Assessment Upper Extremity Assessment: Defer to OT  evaluation    Lower Extremity Assessment Lower Extremity Assessment: Generalized weakness    Cervical / Trunk Assessment Cervical / Trunk Assessment: Kyphotic  Communication   Communication: No difficulties  Cognition Arousal/Alertness: Awake/alert Behavior During Therapy: WFL for tasks assessed/performed Overall Cognitive Status: Within Functional Limits for tasks assessed                                        General Comments      Exercises     Assessment/Plan    PT Assessment Patient needs continued PT services  PT Problem List Decreased strength;Decreased mobility;Decreased activity tolerance;Decreased balance       PT Treatment Interventions DME instruction;Therapeutic exercise;Gait training;Balance training;Stair training;Neuromuscular re-education;Functional mobility training;Therapeutic activities;Patient/family education    PT Goals (Current goals can be found in the Care Plan section)  Acute Rehab PT Goals Patient Stated Goal: Return home PT Goal Formulation: With patient Time For Goal Achievement: 04/17/21 Potential to Achieve Goals: Good    Frequency Min 3X/week   Barriers to discharge        Co-evaluation               AM-PAC PT "6 Clicks" Mobility  Outcome Measure Help needed turning from your back to your side while in a flat bed without using bedrails?: None Help needed moving from lying on your back to sitting on the side of a flat bed without using bedrails?: A Little Help needed moving to and from a bed to a chair (including a wheelchair)?: A Lot Help needed standing up from a chair using your arms (e.g., wheelchair or bedside chair)?: A Lot Help needed to walk in hospital room?: A Lot Help needed climbing 3-5 steps with a railing? : A Lot 6 Click Score: 15    End of Session   Activity Tolerance: Patient tolerated treatment well;Patient limited by fatigue Patient left: in chair;with call bell/phone within  reach Nurse Communication: Mobility status PT Visit Diagnosis: Unsteadiness on feet (R26.81);Other abnormalities of gait and mobility (R26.89);Muscle weakness (generalized) (M62.81)    Time: 8413-2440 PT Time Calculation (min) (ACUTE ONLY): 19 min   Charges:   PT Evaluation $PT Eval Low Complexity: 1 Low PT Treatments $Therapeutic Activity: 8-22 mins        12:24 PM, 04/03/21 Wyman Songster PT, DPT Physical Therapist at [facility]

## 2021-04-03 NOTE — Progress Notes (Signed)
04/03/2021 2:15 PM  I spoke with [facility]  Amy Esterwood and reviewed case. They agreed to consult on patient when she arrives at [facility]    Maryln Manuel MD How to contact the [facility] Attending or Consulting provider 7A - 7P or covering provider during after hours 7P -7A, for this patient?  1. Check the care team in [facility] and look for a) attending/consulting TRH provider listed and b) the [facility] team listed 2. Log into [email or web address] and use [place]'s universal password to access. If you do not have the password, please contact the hospital operator. 3. Locate the [facility] provider you are looking for under [facility] and page to a number that you can be directly reached. 4. If you still have difficulty reaching the provider, please page the [facility] (Director on Call) for the Hospitalists listed on amion for assistance.

## 2021-04-03 NOTE — Progress Notes (Signed)
[name], M.D. Gastroenterology & Hepatology   Interval History:  The patient presented transient drop in her blood pressure overnight, primarily her diastolic blood pressure, for which she was started on Levophed.  She has not presented any fever but she was noted to have severe leukocytosis of 21,000 for which she was started on coverage with Zosyn. An ultrasound of the abdomen was performed yesterday which showed significant dilation of the common bile duct up to 15 mm without presence of any stones. The patient states that her abdominal pain has improved but is still having significant pain.  Has not vomited or has been a nausea, fever, chills, melena or hematochezia. Lactic acid level decreased to 1.6 after receiving IV fluids, her INR was noted to be increasing to 1.6 and her procalcitonin level was seven-point 6 in the morning.   Inpatient Medications:  Current Facility-Administered Medications:  .  0.9 %  sodium chloride infusion, , Intravenous, Continuous, Elgergawy, Silver Huguenin, MD, Last Rate: 100 mL/hr at 04/03/21 1232, Infusion Verify at 04/03/21 1232 .  Chlorhexidine Gluconate Cloth 2 % PADS 6 each, 6 each, Topical, Daily, Elgergawy, Dawood S, MD .  divalproex (DEPAKOTE) DR tablet 125 mg, 125 mg, Oral, TID, Elgergawy, Silver Huguenin, MD, 125 mg at 04/03/21 1024 .  famotidine (PEPCID) tablet 20 mg, 20 mg, Oral, Daily, Elgergawy, Silver Huguenin, MD, 20 mg at 04/03/21 1024 .  levothyroxine (SYNTHROID) tablet 100 mcg, 100 mcg, Oral, QAC breakfast, Elgergawy, Silver Huguenin, MD, 100 mcg at 04/03/21 0527 .  norepinephrine (LEVOPHED) 4mg  in 220mL premix infusion, 0-10 mcg/min, Intravenous, Titrated, Elgergawy, Silver Huguenin, MD, Last Rate: 15 mL/hr at 04/03/21 1232, 4 mcg/min at 04/03/21 1232 .  ondansetron (ZOFRAN) tablet 4 mg, 4 mg, Oral, Q6H PRN **OR** ondansetron (ZOFRAN) injection 4 mg, 4 mg, Intravenous, Q6H PRN, Elgergawy, Dawood S, MD .  oxyCODONE (Oxy IR/ROXICODONE) immediate release tablet 2.5 mg,  2.5 mg, Oral, Q4H PRN, Elgergawy, Silver Huguenin, MD, 2.5 mg at 04/03/21 0530 .  piperacillin-tazobactam (ZOSYN) IVPB 3.375 g, 3.375 g, Intravenous, Q8H, Elgergawy, Silver Huguenin, MD, Stopped at 04/03/21 1230 .  sodium chloride flush (NS) 0.9 % injection 3 mL, 3 mL, Intravenous, Q12H, Elgergawy, Silver Huguenin, MD, 3 mL at 04/03/21 1027 .  tiZANidine (ZANAFLEX) tablet 2 mg, 2 mg, Oral, QHS, Elgergawy, Silver Huguenin, MD, 2 mg at 04/02/21 2223   I/O    Intake/Output Summary (Last 24 hours) at 04/03/2021 1303 Last data filed at 04/03/2021 1232 Gross per 24 hour  Intake 4047.35 ml  Output 950 ml  Net 3097.35 ml     Physical Exam: Temp:  [96.3 F (35.7 C)-96.9 F (36.1 C)] 96.9 F (36.1 C) (04/15 0740) Pulse Rate:  [69-92] 74 (04/15 1200) Resp:  [13-25] 19 (04/15 1200) BP: (69-131)/(33-53) 114/42 (04/15 1200) SpO2:  [92 %-97 %] 96 % (04/15 1200) Weight:  [39.8 kg] 39.8 kg (04/14 1839)  Temp (24hrs), Avg:96.7 F (35.9 C), Min:96.3 F (35.7 C), Max:96.9 F (36.1 C) GENERAL: The patient is AO x3, in no acute distress. Frail, sitting in chair. HEENT: Head is normocephalic and atraumatic. EOMI are intact. Mouth is well hydrated and without lesions. NECK: Supple. No masses LUNGS: Clear to auscultation. No presence of rhonchi/wheezing/rales. Adequate chest expansion HEART: RRR, normal s1 and s2. ABDOMEN: Very tender upon palpation of the right side of the abdomen, especially the right upper quadrant and epigastric area, no guarding, no peritoneal signs, and nondistended. BS +. No masses. EXTREMITIES: Without any cyanosis, clubbing, rash, lesions or  edema. NEUROLOGIC: AOx3, no focal motor deficit. SKIN: no jaundice, no rashes  Laboratory Data: CBC:     Component Value Date/Time   WBC 21.1 (H) 04/03/2021 0515   RBC 3.00 (L) 04/03/2021 0515   HGB 10.4 (L) 04/03/2021 0515   HCT 32.8 (L) 04/03/2021 0515   PLT 86 (L) 04/03/2021 0515   MCV 109.3 (H) 04/03/2021 0515   MCH 34.7 (H) 04/03/2021 0515   MCHC 31.7  04/03/2021 0515   RDW 13.4 04/03/2021 0515   LYMPHSABS 0.2 (L) 04/02/2021 1218   MONOABS 0.0 (L) 04/02/2021 1218   EOSABS 0.0 04/02/2021 1218   BASOSABS 0.0 04/02/2021 1218   COAG:  Lab Results  Component Value Date   INR 1.6 (H) 04/03/2021   INR 4.2 02/09/2011   INR 3.5 01/12/2011    BMP:  BMP Latest Ref Rng & Units 04/03/2021 04/02/2021 06/01/2018  Glucose 70 - 99 mg/dL 78 172(H) 113(H)  BUN 8 - 23 mg/dL 26(H) 27(H) 21(H)  Creatinine 0.44 - 1.00 mg/dL 0.54 0.63 0.90  Sodium 135 - 145 mmol/L 140 136 139  Potassium 3.5 - 5.1 mmol/L 4.4 4.0 3.8  Chloride 98 - 111 mmol/L 108 100 105  CO2 22 - 32 mmol/L 23 21(L) 24  Calcium 8.9 - 10.3 mg/dL 7.9(L) 8.6(L) 9.4    HEPATIC:  Hepatic Function Latest Ref Rng & Units 04/03/2021 04/02/2021 05/26/2009  Total Protein 6.5 - 8.1 g/dL 5.6(L) 6.6 5.3(L)  Albumin 3.5 - 5.0 g/dL 2.4(L) 3.1(L) 2.7(L)  AST 15 - 41 U/L 385(H) 897(H) 30  ALT 0 - 44 U/L 270(H) 434(H) 36(H)  Alk Phosphatase 38 - 126 U/L 159(H) 249(H) 52  Total Bilirubin 0.3 - 1.2 mg/dL 1.3(H) 2.4(H) 1.1  Bilirubin, Direct 0.0 - 0.2 mg/dL 0.8(H) - -    CARDIAC:  Lab Results  Component Value Date   CKTOTAL 46 01/03/2008   CKMB 1.6 01/03/2008   TROPONINI <0.03 06/01/2018      Imaging: I personally reviewed and interpreted the available labs, imaging and endoscopic files.   Assessment/Plan: Patient is a 87 year old female with past medical history of rheumatic heart disease status post mitral valve replacement, s/p PFO closure, syncope s/p implantable loop recorder device, diabetes, COPD, A. Fib s/p Cox-Maze procedure maintaining sinus rhythm, CHF, who came to the hospital after presenting sudden onset of abdominal pain.  Patient was found to have imaging and lab findings consistent with acute pancreatitis along with elevation of her aminotransferases.  In fact, her CT abdomen showed that the inflammatory alterations were located mostly at the head of the pancreas, with possibility of  possible malignancy leading to this presentation.  In fact, the patient has lost significant weight recently.  Both the CT scan and renal ultrasound showed extrahepatic dilation and intrahepatic ductal dilation concerning for ongoing obstruction.  Today, the patient has presented worsening leukocytosis and requirement of pressor support to improve her blood pressure, allowing increased her procalcitonin.  Given these findings, patient is likely presenting worsening acute cholangitis in the setting of acute pancreatitis (Possibly due to pancreatic malignancy versus microlithiasis).  The patient will need decompression of her biliary system for source control of septic shock, for which endoscopic retrograde cholangiopancreatography will be warranted.  I tried reaching the family members discussed this but none of them answered my calls.  At this point we do not have a gastroenterologist that can perform this procedure, for which the patient will need to be transferred to [facility] to proceed with this versus proceeding  with PTC.  I discussed this plan with the hospitalist on-call who will work on transfer.  She should continue on broad antibiotic coverage with Zosyn and remain n.p.o. for now.  Patient is a complex case given her multiple cardiac comorbidities, frailty and age.  #Ascending cholangitis #Acute pancreatitis (possibly due to malignancy versus small stone) -Transfer patient to [facility] for possible ERCP versus PTC -Switch IV fluids to lactated Ringer's at 100 cc/h -Continue with Zosyn IV every 6 hours -Keep n.p.o. -Check CRP -Continue with pressure support with Levophed for MAP goal of >65 millimeters of mercury -Guarded prognosis  [name] Peppers, MD Gastroenterology and Hepatology [facility]

## 2021-04-03 NOTE — Consult Note (Signed)
NAME:  [name], MRN:  [id number], DOB:  Jul 14, 1925, LOS: 1 ADMISSION DATE:  04/02/2021, CONSULTATION DATE:  04/03/2021  REFERRING MD:  Wynetta Emery, triad, CHIEF COMPLAINT:  Abdominal pain   History of Present Illness:  87 year old nursing home resident admitted with abdominal pain .  She reports black stools for "1 year" and just feeling bad. ED evaluation showed -normal WBC count, lipase 472, elevated LFTs including alk phos 249, hemoglobin 9.8 and mild thrombocytopenia.  CT abdomen suggested intrahepatic and extrahepatic biliary dilatation but could not identify reason.  Ultrasound confirmed biliary dilatation. Initial lactate was 4.1 and decreased to 1.6 with IV fluids.  Overnight she was noted to have low MEP and low diastolic pressure for which Levophed was started peripherally.  GI was consulted -impression was acute cholangitis/acute pancreatitis with possibility of pancreatic malignancy -ERCP recommended but this expertise was not available at [facility] hence transferred to [facility] planned  Pertinent  Medical History  Rheumatic heart disease -mitral valve placement Status post PFO closure Syncope/implantable loop recorder Diabetes COPD Atrial fibrillation Hypothyroid  Significant Hospital Events: Including procedures, antibiotic start and stop dates in addition to other pertinent events   .   Interim History / Subjective:  4/14 low-dose Levophed started  Objective   Blood pressure (!) 114/42, pulse 74, temperature (!) 96.9 F (36.1 C), temperature source Axillary, resp. rate 19, height $RemoveBe'5\' 2"'KYAMMYfof$  (1.575 m), weight 39.8 kg, SpO2 96 %.        Intake/Output Summary (Last 24 hours) at 04/03/2021 1358 Last data filed at 04/03/2021 1232 Gross per 24 hour  Intake 4047.35 ml  Output 950 ml  Net 3097.35 ml   Filed Weights   04/02/21 1035 04/02/21 1839  Weight: 36.3 kg 39.8 kg    Examination: General: Elderly woman, lying in hospital bed, no acute distress HENT: No  JVD, no pallor, icterus Lungs: Clear breath sounds bilateral, no accessory muscle use Cardiovascular: S1-S2 regular, soft systolic murmur at base Abdomen: Soft, tender left upper quadrant, no tenderness in right upper quadrant, no guarding Extremities: No edema, no deformity Neuro: Alert, interactive, nonfocal   Labs show normal electrolytes, decreased alkaline phosphatase and lipase with decreased LFTs, increase leukocytosis and procalcitonin  Labs/imaging that I havepersonally reviewed  (right click and "Reselect all SmartList Selections" daily)  CT abd/pelvis 4/14 >>Intra and extrahepatic biliary ductal dilatation. Cause for the obstruction is not seen . Ill-defined and edematous appearing head of the pancreas with surrounding stranding is likely due to pancreatitis ? Underlying neoplasm  US abdomen 4/14 >> Common bile duct dilatation 15 mm in a patient status post cholecystectomy  Resolved Hospital Problem list     Assessment & Plan:  Septic shock due to acute cholangitis/acute pancreatitis Underlying pancreatic malignancy possible Status post cholecystectomy, biliary calculus not identified on current imaging  Septic shock -lactate has cleared we will continue Levophed via peripheral IV -Continue empiric Zosyn, simplify once cultures obtained.  Acute cholangitis/pancreatitis -N.p.o. -Lipase has decreased -Transfer to [facility] for GI evaluation for ERCP, hospitalist has spoken to on-call [place] APP  Best practice (right click and "Reselect all SmartList Selections" daily)  Diet:  NPO Pain/Anxiety/Delirium protocol (if indicated): No VAP protocol (if indicated): Not indicated DVT prophylaxis: SCD GI prophylaxis: H2B Glucose control:  SSI No Central venous access:  N/A Arterial line:  N/A Foley:  N/A Mobility:  bed rest  PT consulted: N/A Last date of multidisciplinary goals of care discussion [NA] multiple attempts made to reach son, messages left Code  Status:   DNR Disposition: ICU > transfer to [facility]  Labs   CBC: Recent Labs  Lab 04/02/21 1218 04/03/21 0515  WBC 4.4 21.1*  NEUTROABS 3.5  --   HGB 11.8* 10.4*  HCT 36.9 32.8*  MCV 109.5* 109.3*  PLT 89* 86*    Basic Metabolic Panel: Recent Labs  Lab 04/02/21 1218 04/03/21 0515  NA 136 140  K 4.0 4.4  CL 100 108  CO2 21* 23  GLUCOSE 172* 78  BUN 27* 26*  CREATININE 0.63 0.54  CALCIUM 8.6* 7.9*   GFR: Estimated Creatinine Clearance: 27.6 mL/min (by C-G formula based on SCr of 0.54 mg/dL). Recent Labs  Lab 04/02/21 1218 04/02/21 1804 04/03/21 0515 04/03/21 0807  PROCALCITON 5.57  --  7.63  --   WBC 4.4  --  21.1*  --   LATICACIDVEN  --  4.1*  --  1.6    Liver Function Tests: Recent Labs  Lab 04/02/21 1218 04/03/21 0515  AST 897* 385*  ALT 434* 270*  ALKPHOS 249* 159*  BILITOT 2.4* 1.3*  PROT 6.6 5.6*  ALBUMIN 3.1* 2.4*   Recent Labs  Lab 04/02/21 1218 04/03/21 0515  LIPASE 472* 112*   No results for input(s): AMMONIA in the last 168 hours.  ABG    Component Value Date/Time   PHART 7.375 05/24/2009 1123   PCO2ART 37.1 05/24/2009 1123   PO2ART 65.0 (L) 05/24/2009 1123   HCO3 21.7 05/24/2009 1123   TCO2 26 05/24/2009 1736   ACIDBASEDEF 3.0 (H) 05/24/2009 1123   O2SAT 92.0 05/24/2009 1123     Coagulation Profile: Recent Labs  Lab 04/03/21 0515  INR 1.6*    Cardiac Enzymes: No results for input(s): CKTOTAL, CKMB, CKMBINDEX, TROPONINI in the last 168 hours.  HbA1C: Hgb A1c MFr Bld  Date/Time Value Ref Range Status  10/08/2017 11:59 PM 6.3 (H) 4.8 - 5.6 % Final    Comment:    (NOTE) Pre diabetes:          5.7%-6.4% Diabetes:              >6.4% Glycemic control for   <7.0% adults with diabetes   05/20/2009 12:59 PM  4.6 - 6.1 % Final   5.9 (NOTE) The ADA recommends the following therapeutic goal for glycemic control related to Hgb A1c measurement: Goal of therapy: <6.5 Hgb A1c  Reference: American Diabetes Association: Clinical Practice  Recommendations 2010, Diabetes Care, 2010, 33: (Suppl  1).    CBG: No results for input(s): GLUCAP in the last 168 hours.  Review of Systems:   Abdominal pain, nausea, no vomiting No hematemesis, reports black stools Gait issues +   Constitutional: negative for anorexia, fevers and sweats  Eyes: negative for irritation, redness and visual disturbance  Ears, nose, mouth, throat, and face: negative for earaches, epistaxis, nasal congestion and sore throat  Respiratory: negative for cough, dyspnea on exertion, sputum and wheezing  Cardiovascular: negative for chest pain, dyspnea, lower extremity edema, orthopnea, palpitations and syncope  Genitourinary:negative for dysuria, frequency and hematuria  Hematologic/lymphatic: negative for bleeding, easy bruising and lymphadenopathy  Musculoskeletal:negative for arthralgias, muscle weakness and stiff joints  Neurological: negative for coordination problems,  headaches and weakness  Endocrine: negative for diabetic symptoms including polydipsia, polyuria and weight loss    Past Medical History:  She,  has a past medical history of Arrhythmia, Atrial fibrillation ([place]), CHF (congestive heart failure) ([place]), COPD (chronic obstructive pulmonary disease) ([place]), Degenerative lumbar spinal stenosis, Diabetes ([place]), GERD (gastroesophageal  reflux disease), Hypothyroidism, PFO (patent foramen ovale), Rheumatic heart disease, and Syncope.   Surgical History:   Past Surgical History:  Procedure Laterality Date  . ABDOMINAL HYSTERECTOMY    . CHOLECYSTECTOMY    . COLON SURGERY     Details unavailable but on CT scan she has a surgical anastomosis in the sigmoid colon.  . EP IMPLANTABLE DEVICE N/A 06/27/2015   MDT LINQ ILR implanted by Dr Rayann Heman for unexplained syncope  . EXPLORATORY LAPAROTOMY    . FOOT SURGERY     left foot  . MITRAL VALVE REPLACEMENT    . PERCUTANEOUS BALLOON VALVULOPLASTY  1993   Mitral valve     Social History:   reports  that she quit smoking about 32 years ago. Her smoking use included cigarettes. She started smoking about 43 years ago. She has a 5.40 pack-year smoking history. She has never used smokeless tobacco. She reports that she does not drink alcohol and does not use drugs.   Family History:  Her family history includes Heart attack in her brother, father, and son; Heart disease in her brother, father, and son. There is no history of Coronary artery disease, Diabetes, or Hypertension.   Allergies Allergies  Allergen Reactions  . Sulfonamide Derivatives Anaphylaxis, Shortness Of Breath and Swelling  . Aspirin     Uncoated Aspirin = stomach upset/pain EC Aspirin is ok     Home Medications  Prior to Admission medications   Medication Sig Start Date End Date Taking? Authorizing Provider  acetaminophen (TYLENOL) 500 MG tablet Take 500 mg by mouth every 6 (six) hours as needed.   Yes [provider]  cholecalciferol (VITAMIN D) 25 MCG (1000 UNIT) tablet Take 1 tablet by mouth daily.   Yes [provider]  divalproex (DEPAKOTE) 125 MG DR tablet Take 125 mg by mouth 3 (three) times daily.   Yes [provider]  famotidine (PEPCID) 20 MG tablet Take 20 mg by mouth daily.   Yes [provider]  ferrous sulfate 325 (65 FE) MG tablet Take 325 mg by mouth 2 (two) times daily with a meal.   Yes [provider]  fluticasone (FLONASE SENSIMIST) 27.5 MCG/SPRAY nasal spray Place 1 spray into the nose daily.   Yes [provider]  guaiFENesin-dextromethorphan (ROBITUSSIN DM) 100-10 MG/5ML syrup Take 10 mLs by mouth every 4 (four) hours as needed for cough.   Yes [provider]  levothyroxine (SYNTHROID, LEVOTHROID) 100 MCG tablet Take 100 mcg by mouth daily before breakfast.   Yes [provider]  lidocaine (LIDODERM) 5 % Place 1 patch onto the skin daily. Remove & Discard patch within 12 hours or as directed by MD AT BEDTIME   Yes [provider]  loratadine (CLARITIN) 10 MG tablet Take 10 mg by mouth daily.   Yes [provider]  magnesium hydroxide (MILK OF MAGNESIA) 400 MG/5ML suspension Take 30 mLs by mouth daily as needed for mild constipation.   Yes [provider]  melatonin 5 MG TABS Take 5 mg by mouth at bedtime.   Yes [provider]  metoprolol succinate (TOPROL-XL) 25 MG 24 hr tablet Take 1 tablet (25 mg total) by mouth daily. Patient taking differently: Take 12.5 mg by mouth daily. 12.$RemoveBeforeD'5mg'kMmLQZPpAqeIhg$  in morning for htn hold for bp 110/60 or HR60 02/10/16  Yes Herminio Commons, MD  mirtazapine (REMERON) 7.5 MG tablet Take 7.5 mg by mouth at bedtime.   Yes [provider]  OLANZapine (ZYPREXA) 2.5  MG tablet Take 1.25 mg by mouth at bedtime.   Yes [provider]  senna (SENOKOT) 8.6 MG tablet Take 2 tablets by mouth daily.   Yes [provider]  tizanidine (ZANAFLEX) 2 MG capsule Take 2 mg by mouth at bedtime.   Yes [provider]     Critical care time: 52m     Kara Mead MD. FCCP. [facility] Pager : [phone]  If no response to pager , please call [phone] until 7 pm After 7:00 pm call Elink  [phone]   04/03/2021

## 2021-04-03 NOTE — TOC Initial Note (Signed)
Transition of Care [facility]) - Initial/Assessment Note    Patient Details  Name: [name] MRN: [id number] Date of Birth: 03-07-1925  [facility] CM/SW Contact:    Elliot Gault, LCSW Phone Number: 04/03/2021, 11:02 AM  Clinical Narrative:                  Pt admitted from [facility]. Per MD, pt needs Palliative consult. Updated Jill Side at [facility] and they are able to accept pt back to facility at dc.   TOC will follow and further assist once pt's goals of care and dc needs are determined.  Expected Discharge Plan: Long Term Nursing Home Barriers to Discharge: Continued Medical Work up   Patient Goals and CMS Choice        Expected Discharge Plan and Services Expected Discharge Plan: Long Term Nursing Home       Living arrangements for the past 2 months: Skilled Nursing Facility                                      Prior Living Arrangements/Services Living arrangements for the past 2 months: Skilled Nursing Facility Lives with:: Facility Resident Patient language and need for interpreter reviewed:: Yes        Need for Family Participation in Patient Care: No (Comment) Care giver support system in place?: Yes (comment)   Criminal Activity/Legal Involvement Pertinent to Current Situation/Hospitalization: No - Comment as needed  Activities of Daily Living Home Assistive Devices/Equipment: Environmental consultant (specify type) ADL Screening (condition at time of admission) Patient's cognitive ability adequate to safely complete daily activities?: Yes Is the patient deaf or have difficulty hearing?: No Does the patient have difficulty seeing, even when wearing glasses/contacts?: No Does the patient have difficulty concentrating, remembering, or making decisions?: No Patient able to express need for assistance with ADLs?: Yes Does the patient have difficulty dressing or bathing?: No Independently performs ADLs?: Yes (appropriate for  developmental age) Does the patient have difficulty walking or climbing stairs?: Yes Weakness of Legs: Both Weakness of Arms/Hands: Both  Permission Sought/Granted                  Emotional Assessment       Orientation: : Oriented to Self Alcohol / Substance Use: Not Applicable Psych Involvement: No (comment)  Admission diagnosis:  Acute pancreatitis [K85.90] Generalized abdominal pain [R10.84] Abdominal pain [R10.9] Acute pancreatitis without infection or necrosis, unspecified pancreatitis type [K85.90] Patient Active Problem List   Diagnosis Date Noted  . Generalized abdominal pain   . Acute pancreatitis 04/02/2021  . LLQ pain   . Chest pain 10/09/2017  . Syncope 10/24/2015  . Faintness   . PFO (patent foramen ovale)   . Atrial fibrillation (HCC)   . GERD (gastroesophageal reflux disease)   . COPD (chronic obstructive pulmonary disease) (HCC)   . Hypothyroidism   . Insomnia 09/23/2011  . DEPRESSION, SITUATIONAL, ACUTE 04/20/2010  . RHEUMATIC HEART DISEASE 11/10/2009  . CHF 11/10/2009  . COPD 11/10/2009  . SPINAL STENOSIS 11/10/2009  . MITRAL VALVE REPLACEMENT, HX OF 11/10/2009   PCP:  Toma Deiters, MD Pharmacy:   [facility] - [place], [place] - [address] [place] [address] Phone: [phone] Fax: [phone]  [facility] - [place], [place] - [address] [place] [address] Phone: [phone]  Fax: [phone]  [facility] [address] [place] - [address] [place] [address] [place] [address] Phone: [phone] Fax: [phone]     Social Determinants of Health (SDOH) Interventions    Readmission Risk Interventions Readmission Risk Prevention Plan 04/03/2021  Transportation Screening Complete  Medication Review (RN CM) Complete  Some recent data might be hidden

## 2021-04-03 NOTE — Progress Notes (Signed)
Report given to carelink 

## 2021-04-03 NOTE — Progress Notes (Signed)
Patient seen today in the ICU after arriving from [facility]. Please see GI consult note from [facility] for details  Lab data as well as, CT abdomen pelvis without contrast plus ultrasound abdomen from yesterday reviewed. Question is common duct obstruction versus primary pancreatic process.  There is also concern for cholangitis  Liver enzymes are all improving though white count has bumped dramatically today to 21,000.  Patient is now off of vasopressors entirely.  We had a full conversation and she is denying upper abdominal pain.  She does report having passed some dark stools of late and having issues with constipation.  Case also discussed with Dr. Ewing Schlein, biliary colic for the weekend.  We recommend MRI abdomen plus MRCP before further consideration of ERCP in this 87 year old female with multiple medical issues.  We will follow and see the patient again tomorrow Trend LFTs

## 2021-04-03 NOTE — [place] FL2 (Signed)
[place] MEDICAID FL2 LEVEL OF CARE SCREENING TOOL     IDENTIFICATION  Patient Name: [name] Birthdate: 1925/10/01 Sex: female Admission Date (Current Location): 04/02/2021  [facility] and [place] Number:  Producer, television/film/video and Address:  [facility],  [address], [place] [address]      Provider Number: [id number]  Attending Physician Name and Address:  Cleora Fleet, MD  Relative Name and Phone Number:       Current Level of Care: Hospital Recommended Level of Care: Skilled Nursing Facility Prior Approval Number:    Date Approved/Denied:   PASRR Number:    Discharge Plan: SNF    Current Diagnoses: Patient Active Problem List   Diagnosis Date Noted  . Generalized abdominal pain   . Acute pancreatitis 04/02/2021  . LLQ pain   . Chest pain 10/09/2017  . Syncope 10/24/2015  . Faintness   . PFO (patent foramen ovale)   . Atrial fibrillation (HCC)   . GERD (gastroesophageal reflux disease)   . COPD (chronic obstructive pulmonary disease) (HCC)   . Hypothyroidism   . Insomnia 09/23/2011  . DEPRESSION, SITUATIONAL, ACUTE 04/20/2010  . RHEUMATIC HEART DISEASE 11/10/2009  . CHF 11/10/2009  . COPD 11/10/2009  . SPINAL STENOSIS 11/10/2009  . MITRAL VALVE REPLACEMENT, HX OF 11/10/2009    Orientation RESPIRATION BLADDER Height & Weight     Self  Normal Continent Weight: 87 lb 11.9 oz (39.8 kg) Height:  5\' 2"  (157.5 cm)  BEHAVIORAL SYMPTOMS/MOOD NEUROLOGICAL BOWEL NUTRITION STATUS      Continent Diet (see dc summary)  AMBULATORY STATUS COMMUNICATION OF NEEDS Skin   Supervision Verbally Normal                       Personal Care Assistance Level of Assistance  Bathing,Feeding,Dressing Bathing Assistance: Limited assistance Feeding assistance: Limited assistance Dressing Assistance: Limited assistance     Functional Limitations Info  Sight,Hearing,Speech Sight Info: Impaired Hearing Info: Adequate Speech Info: Adequate     SPECIAL CARE FACTORS FREQUENCY                       Contractures Contractures Info: Not present    Additional Factors Info  Code Status,Allergies Code Status Info: DNR Allergies Info: Sulfonamide Derivatives, Aspirin           Current Medications (04/03/2021):  This is the current hospital active medication list Current Facility-Administered Medications  Medication Dose Route Frequency Provider Last Rate Last Admin  . 0.9 %  sodium chloride infusion   Intravenous Continuous Elgergawy, 04/05/2021, MD 100 mL/hr at 04/03/21 0955 Infusion Verify at 04/03/21 0955  . Chlorhexidine Gluconate Cloth 2 % PADS 6 each  6 each Topical Daily Elgergawy, 04/05/21, MD      . divalproex (DEPAKOTE) DR tablet 125 mg  125 mg Oral TID Elgergawy, Leana Roe, MD   125 mg at 04/03/21 1024  . famotidine (PEPCID) tablet 20 mg  20 mg Oral Daily Elgergawy, 04/05/21, MD   20 mg at 04/03/21 1024  . levothyroxine (SYNTHROID) tablet 100 mcg  100 mcg Oral QAC breakfast Elgergawy, 04/05/21, MD   100 mcg at 04/03/21 0527  . norepinephrine (LEVOPHED) 4mg  in 04/05/21 premix infusion  0-10 mcg/min Intravenous Titrated Elgergawy, , MD 11.25 mL/hr at 04/03/21 0955 3 mcg/min at 04/03/21 0955  . ondansetron (ZOFRAN) tablet 4 mg  4 mg Oral Q6H PRN Elgergawy, 04/05/21, MD  Or  . ondansetron (ZOFRAN) injection 4 mg  4 mg Intravenous Q6H PRN Elgergawy, Leana Roe, MD      . oxyCODONE (Oxy IR/ROXICODONE) immediate release tablet 2.5 mg  2.5 mg Oral Q4H PRN Elgergawy, Leana Roe, MD   2.5 mg at 04/03/21 0530  . piperacillin-tazobactam (ZOSYN) IVPB 3.375 g  3.375 g Intravenous Q8H Elgergawy, Leana Roe, MD 12.5 mL/hr at 04/03/21 0955 Infusion Verify at 04/03/21 0955  . sodium chloride flush (NS) 0.9 % injection 3 mL  3 mL Intravenous Q12H Elgergawy, Leana Roe, MD   3 mL at 04/03/21 1027  . tiZANidine (ZANAFLEX) tablet 2 mg  2 mg Oral QHS Elgergawy, Leana Roe, MD   2 mg at 04/02/21 2223     Discharge Medications: Please  see discharge summary for a list of discharge medications.  Relevant Imaging Results:  Relevant Lab Results:   Additional Information    Elliot Gault, LCSW

## 2021-04-04 ENCOUNTER — Inpatient Hospital Stay (HOSPITAL_COMMUNITY): Payer: Medicare Other

## 2021-04-04 DIAGNOSIS — K8050 Calculus of bile duct without cholangitis or cholecystitis without obstruction: Secondary | ICD-10-CM | POA: Diagnosis not present

## 2021-04-04 DIAGNOSIS — K8309 Other cholangitis: Secondary | ICD-10-CM | POA: Diagnosis not present

## 2021-04-04 DIAGNOSIS — K859 Acute pancreatitis without necrosis or infection, unspecified: Secondary | ICD-10-CM

## 2021-04-04 DIAGNOSIS — K8500 Idiopathic acute pancreatitis without necrosis or infection: Secondary | ICD-10-CM

## 2021-04-04 LAB — AMMONIA: Ammonia: 43 umol/L — ABNORMAL HIGH (ref 9–35)

## 2021-04-04 LAB — GLUCOSE, CAPILLARY
Glucose-Capillary: 103 mg/dL — ABNORMAL HIGH (ref 70–99)
Glucose-Capillary: 113 mg/dL — ABNORMAL HIGH (ref 70–99)
Glucose-Capillary: 115 mg/dL — ABNORMAL HIGH (ref 70–99)
Glucose-Capillary: 125 mg/dL — ABNORMAL HIGH (ref 70–99)
Glucose-Capillary: 45 mg/dL — ABNORMAL LOW (ref 70–99)
Glucose-Capillary: 93 mg/dL (ref 70–99)

## 2021-04-04 LAB — CBC
HCT: 28.8 % — ABNORMAL LOW (ref 36.0–46.0)
Hemoglobin: 9.2 g/dL — ABNORMAL LOW (ref 12.0–15.0)
MCH: 34.2 pg — ABNORMAL HIGH (ref 26.0–34.0)
MCHC: 31.9 g/dL (ref 30.0–36.0)
MCV: 107.1 fL — ABNORMAL HIGH (ref 80.0–100.0)
Platelets: 73 10*3/uL — ABNORMAL LOW (ref 150–400)
RBC: 2.69 MIL/uL — ABNORMAL LOW (ref 3.87–5.11)
RDW: 13.6 % (ref 11.5–15.5)
WBC: 12.9 10*3/uL — ABNORMAL HIGH (ref 4.0–10.5)
nRBC: 0 % (ref 0.0–0.2)

## 2021-04-04 LAB — COMPREHENSIVE METABOLIC PANEL
ALT: 117 U/L — ABNORMAL HIGH (ref 0–44)
AST: 92 U/L — ABNORMAL HIGH (ref 15–41)
Albumin: 1.7 g/dL — ABNORMAL LOW (ref 3.5–5.0)
Alkaline Phosphatase: 126 U/L (ref 38–126)
Anion gap: 9 (ref 5–15)
BUN: 14 mg/dL (ref 8–23)
CO2: 18 mmol/L — ABNORMAL LOW (ref 22–32)
Calcium: 7.9 mg/dL — ABNORMAL LOW (ref 8.9–10.3)
Chloride: 107 mmol/L (ref 98–111)
Creatinine, Ser: 0.48 mg/dL (ref 0.44–1.00)
GFR, Estimated: >60 mL/min (ref >60)
Glucose, Bld: 90 mg/dL (ref 70–99)
Potassium: 3 mmol/L — ABNORMAL LOW (ref 3.5–5.1)
Sodium: 134 mmol/L — ABNORMAL LOW (ref 135–145)
Total Bilirubin: 0.9 mg/dL (ref 0.3–1.2)
Total Protein: 5.6 g/dL — ABNORMAL LOW (ref 6.5–8.1)

## 2021-04-04 LAB — LIPASE, BLOOD: Lipase: 37 U/L (ref 11–51)

## 2021-04-04 LAB — PROTIME-INR
INR: 1.3 — ABNORMAL HIGH (ref 0.8–1.2)
Prothrombin Time: 16 seconds — ABNORMAL HIGH (ref 11.4–15.2)

## 2021-04-04 LAB — SARS CORONAVIRUS 2 (TAT 6-24 HRS): SARS Coronavirus 2: NEGATIVE

## 2021-04-04 MED ORDER — NITROGLYCERIN 0.4 MG SL SUBL
SUBLINGUAL_TABLET | SUBLINGUAL | Status: AC
  Filled 2021-04-04: qty 1

## 2021-04-04 MED ORDER — FUROSEMIDE 10 MG/ML IJ SOLN
20.0000 mg | Freq: Once | INTRAMUSCULAR | Status: AC
  Administered 2021-04-04: 20 mg via INTRAVENOUS
  Filled 2021-04-04: qty 2

## 2021-04-04 MED ORDER — VALPROATE SODIUM 100 MG/ML IV SOLN
125.0000 mg | Freq: Three times a day (TID) | INTRAVENOUS | Status: AC
  Administered 2021-04-04 – 2021-04-05 (×5): 125 mg via INTRAVENOUS
  Filled 2021-04-04 (×8): qty 1.25

## 2021-04-04 MED ORDER — DEXTROSE IN LACTATED RINGERS 5 % IV SOLN: INTRAVENOUS | Status: DC

## 2021-04-04 MED ORDER — GADOBUTROL 1 MMOL/ML IV SOLN
4.0000 mL | Freq: Once | INTRAVENOUS | Status: AC | PRN
  Administered 2021-04-04: 4 mL via INTRAVENOUS

## 2021-04-04 MED ORDER — NITROGLYCERIN 0.4 MG SL SUBL
0.4000 mg | SUBLINGUAL_TABLET | SUBLINGUAL | Status: DC | PRN
  Administered 2021-04-04 (×3): 0.4 mg via SUBLINGUAL
  Filled 2021-04-04 (×2): qty 1

## 2021-04-04 NOTE — Progress Notes (Signed)
Patient ID: LARYSSA HASSING, female   DOB: 1925-11-16, 87 y.o.   MRN: [id number]    Progress Note   Subjective   Day # 2 CC; cholangitis, septic shock  IV Zosyn Off pressors  MRI/MRCP-bilateral effusions, compressive atelectasis, status postcholecystectomy, moderate intrahepatic biliary ductal dilation present back to February 2021 CBD 1.3 cm in the porta hepatis gradually tapering to 9 mm just above the pancreatic head cannot exclude ampullary stricture or stone, equivocal 2 mm mid common bile duct dependent stone, mild noncomplicated pancreatitis  Patient is alert and conversant today, says she does not feel well at all in general and is achy.  No nausea or vomiting and would like something to eat no complaints of abdominal pain  Lab still pending INR 1.6 yesterday   Objective   Vital signs in last 24 hours: Temp:  [96.5 F (35.8 C)-97.9 F (36.6 C)] 97.8 F (36.6 C) (04/16 0700) Pulse Rate:  [72-96] 96 (04/16 1408) Resp:  [15-35] 23 (04/16 1408) BP: (96-128)/(43-99) 97/57 (04/16 1400) SpO2:  [87 %-96 %] 90 % (04/16 1408) Last BM Date: 04/02/21 General:     in NAD Heart:  Regular rate and rhythm; no murmurs Lungs: Respirations even and unlabored, lungs CTA bilaterally Abdomen:  Soft, nontender and nondistended. Normal bowel sounds. Extremities:  Without edema. Neurologic:  Alert and oriented,  grossly normal neurologically. Psych:  Cooperative. Normal mood and affect.  Intake/Output from previous day: 04/15 0701 - 04/16 0700 In: 1857.7 [I.V.:1761.3; IV Piggyback:96.4] Out: 850 [Urine:850] Intake/Output this shift: Total I/O In: 775.1 [I.V.:624; IV Piggyback:151.1] Out: 80 [Urine:80]  Lab Results: Recent Labs    04/02/21 1218 04/03/21 0515  WBC 4.4 21.1*  HGB 11.8* 10.4*  HCT 36.9 32.8*  PLT 89* 86*   BMET Recent Labs    04/02/21 1218 04/03/21 0515  NA 136 140  K 4.0 4.4  CL 100 108  CO2 21* 23  GLUCOSE 172* 78  BUN 27* 26*  CREATININE 0.63 0.54   CALCIUM 8.6* 7.9*   LFT Recent Labs    04/03/21 0515  PROT 5.6*  ALBUMIN 2.4*  AST 385*  ALT 270*  ALKPHOS 159*  BILITOT 1.3*  BILIDIR 0.8*  IBILI 0.5   PT/INR Recent Labs    04/03/21 0515  LABPROT 19.0*  INR 1.6*    Studies/Results: US Abdomen Complete  Result Date: 04/02/2021 CLINICAL DATA:  Possible biliary obstruction, abdominal pain for 2-3 days. Status post cholecystectomy. EXAM: ABDOMEN ULTRASOUND COMPLETE COMPARISON:  CT abdomen pelvis 04/02/2021 FINDINGS: Gallbladder: Status post cholecystectomy. Common bile duct: Diameter: 15 mm. Liver: No focal lesion identified. Within normal limits in parenchymal echogenicity. Portal vein is patent on color Doppler imaging with normal direction of blood flow towards the liver. IVC: No abnormality visualized. Pancreas: Visualized portion unremarkable. Spleen: Size and appearance within normal limits. Right Kidney: Length: 9.9 cm. Cortical thinning and scarring. Echogenicity within normal limits. No mass or hydronephrosis visualized. Left Kidney: Length: 9.5 cm. There is a 2.3 x 1.6 x 2.1 cm cystic lesion within the left kidney. There is another 4.3 x 3.4 x 3.4 cm cystic lesion. Echogenicity within normal limits. No mass or hydronephrosis visualized. Abdominal aorta: No aneurysm visualized. Other findings: None. IMPRESSION: Common bile duct dilatation in a patient status post cholecystectomy. Given CBD measures up to 15 mm, consider MRCP if clinically indicated. Electronically Signed   By: Tish Frederickson M.D.   On: 04/02/2021 17:15   MR ABDOMEN MRCP W WO CONTAST  Result Date: 04/04/2021  CLINICAL DATA:  Jaundice. Evaluate for common bile duct stone. Diabetes. EXAM: MRI ABDOMEN WITHOUT AND WITH CONTRAST (INCLUDING MRCP) TECHNIQUE: Multiplanar multisequence MR imaging of the abdomen was performed both before and after the administration of intravenous contrast. Heavily T2-weighted images of the biliary and pancreatic ducts were obtained, and  three-dimensional MRCP images were rendered by post processing. CONTRAST:  4mL GADAVIST GADOBUTROL 1 MMOL/ML IV SOLN COMPARISON:  Ultrasound 04/02/2021.  CT 04/02/2021. FINDINGS: Portions of exam are moderately motion degraded. Lower chest: Moderate bilateral pleural effusions with compressive atelectasis. Mild cardiomegaly. A pleural-based crescentric lesion along the inferior right hemidiaphragm is T1 hyperintense and measures 2.6 cm on 34/7. This is present back to 06/02/2018 and is therefore of doubtful clinical significance. Possibly a chronic hematoma given precontrast signal. Hepatobiliary: Extremely subtle areas of heterogeneous hyperenhancement are identified primarily on arterial phase imaging. Examples in the high left hepatic lobe 1.4 cm on 18/20, hepatic dome on 12/20, and high right hepatic lobe on 20/20. Some of these are subtly apparent on T2 weighted series 7. Cholecystectomy. Moderate intrahepatic biliary duct dilatation, present back to 01/25/2020 CT. The common duct measures up to 1.3 cm in the porta hepatis on 10/15. Tapers relatively gradually to 9 mm just above the pancreatic head. At and just above the ampulla is a suggestion of somewhat more focal narrowing with possible T2 hypointensity at the ampulla. Example 23/4 and 7/15. Within the posterior mid common duct, 2 mm subtle T2 hypointensity on 06/15 and 6 26/13 is equivocal for tiny nonobstructive stone. Pancreas: Especially when compared to the CT of 01/25/2020, the pancreas is mildly edematous with peripancreatic edema. No necrosis or pancreatic duct dilatation. No peripancreatic fluid collection. Spleen:  Normal in size, without focal abnormality. Adrenals/Urinary Tract: Normal right adrenal gland. Left adrenal thickening. Bilateral renal cysts. Malrotated right kidney. No hydronephrosis. Stomach/Bowel: Grossly normal abdominal bowel loops and stomach. Vascular/Lymphatic: Aortic atherosclerosis. Patent portal and splenic veins. No  abdominal adenopathy. Other:  Perihepatic and perisplenic small volume ascites. Musculoskeletal: Convex left lumbar spine curvature with advanced spondylosis. IMPRESSION: 1. Moderately motion degraded exam. 2. Chronic biliary duct dilatation back to 01/25/2020. Narrowing at and just above the ampulla with suggestion of T2 hypointensity at the ampulla. Cannot exclude ampullary stricture or stone. If this 87 year old patient is a treatment candidate, consider ERCP. 3. Equivocal 2 mm mid common duct dependent stone, not a cause of obstruction. 4. Mild non complicated pancreatitis. 5. Subtle foci of rounded heterogeneous enhancement within the liver,a new compared to 01/25/2020 CT. In this patient with chronic biliary duct dilatation, developing abscesses are a concern. Suboptimally evaluated secondary to motion on the current exam. 6. Bilateral pleural effusions and ascites, suggesting fluid overload. 7.  Aortic Atherosclerosis (ICD10-I70.0). Electronically Signed   By: Jeronimo Greaves M.D.   On: 04/04/2021 12:25       Assessment / Plan:    #70  87 year old with ascending cholangitis and mild noncomplicated pancreatitis by imaging, complicated by sepsis  She has been stable, and is now off pressors Continues on IV Zosyn Labs pending  No complaints of pain and is hungry  MRI/MRCP does not show any evidence of pancreatic malignancy, does suggest mid common bile duct stone.  #2 thrombocytopenia in setting of sepsis-parameters pending today #3 minimal coagulopathy will repeat INR #4 History of hypertension #5 adult onset diabetes mellitus  Plan; clear liquids today, n.p.o. after midnight Await pending labs today, repeat in a.m. Check pro time/INR Patient has been scheduled for ERCP with Dr. Ewing Schlein  for tomorrow.  Procedure was discussed with the patient however do not feel that she is oriented enough to consent.  We will reach out to her son for consent/Ray Cresenciano Genre [phone].     Active  Problems:   RHEUMATIC HEART DISEASE   MITRAL VALVE REPLACEMENT, HX OF   GERD (gastroesophageal reflux disease)   COPD (chronic obstructive pulmonary disease) (HCC)   Hypothyroidism   Acute pancreatitis   Generalized abdominal pain     LOS: 2 days   Clover Feehan  04/04/2021, 2:25 PM

## 2021-04-04 NOTE — Progress Notes (Signed)
eLink Physician-Brief Progress Note Patient Name: [name] DOB: 1925-01-13 MRN: [id number]   Date of Service  04/04/2021  HPI/Events of Note  Patient is NPO for surgery in a.m.  eICU Interventions  Oral Depakote discontinued and iv substituted, LR infusion changed to D 5 LR gtt @ 75 ml / hour.        [name] [name] 04/04/2021, 12:50 AM

## 2021-04-04 NOTE — Progress Notes (Signed)
NAME:  [name], MRN:  [id number], DOB:  1925-06-06, LOS: 2 ADMISSION DATE:  04/02/2021, CONSULTATION DATE:  04/04/2021  REFERRING MD:  Wynetta Emery, triad, CHIEF COMPLAINT:  Abdominal pain   History of Present Illness:  87 year old nursing home resident admitted with abdominal pain .  She reports black stools for "1 year" and just feeling bad. ED evaluation showed -normal WBC count, lipase 472, elevated LFTs including alk phos 249, hemoglobin 9.8 and mild thrombocytopenia.  CT abdomen suggested intrahepatic and extrahepatic biliary dilatation but could not identify reason.  Ultrasound confirmed biliary dilatation. Initial lactate was 4.1 and decreased to 1.6 with IV fluids.  Overnight she was noted to have low MEP and low diastolic pressure for which Levophed was started peripherally.  GI was consulted -impression was acute cholangitis/acute pancreatitis with possibility of pancreatic malignancy -ERCP recommended but this expertise was not available at [facility] hence transferred to [facility] planned  Pertinent  Medical History  Rheumatic heart disease -mitral valve placement Status post PFO closure Syncope/implantable loop recorder Diabetes COPD Atrial fibrillation Hypothyroid  Significant Hospital Events: Including procedures, antibiotic start and stop dates in addition to other pertinent events   . 4/14 Admitted to [facility] . 4/15 Transferred to [facility] ICU  Antibiotics: Zosyn 4/14>>  Interim History / Subjective:   No acute events overnight.   Patient has been weaned off levophed prior to her arrival to [place] is following. MRCP done overnight.   Objective   Blood pressure (!) 115/55, pulse 80, temperature 97.8 F (36.6 C), temperature source Oral, resp. rate (!) 21, height _0  (1.575 m), weight 39.8 kg, SpO2 92 %.        Intake/Output Summary (Last 24 hours) at 04/04/2021 1052 Last data filed at 04/04/2021 0800 Gross per 24 hour   Intake 1219.75 ml  Output 850 ml  Net 369.75 ml   Filed Weights   04/02/21 1035 04/02/21 1839  Weight: 36.3 kg 39.8 kg    Examination: General: Elderly woman, lying in hospital bed, no acute distress HENT: [place]/AT, sclera anicteric, moist mucous membranes, no JVD Lungs: Clear to auscultation bilaterally. No wheezing, rales or rhonchi. Cardiovascular: S1-S2 regular, soft systolic murmur at base Abdomen: Soft, non-tender, no guarding, non-distended Extremities: No edema, no deformity Neuro: Alert, interactive, nonfocal  Labs/imaging that I have personally reviewed  (right click and "Reselect all SmartList Selections" daily)  CT abd/pelvis 4/14 >>Intra and extrahepatic biliary ductal dilatation. Cause for the obstruction is not seen . Ill-defined and edematous appearing head of the pancreas with surrounding stranding is likely due to pancreatitis ? Underlying neoplasm  US abdomen 4/14 >> Common bile duct dilatation 15 mm in a patient status post cholecystectomy  MRCP - results pending  BMP: glucose 78, Cr 0.54,  lipase 112 procal 7.63  Resolved Hospital Problem list   Shock  Assessment & Plan:   Sepsis due to acute cholangitis/acute pancreatitis - shock has resolved, she has weaned off levophed - Continue zosyn - continue D5LR at 44m/hr - NPO for possible ERCP - Pending MRCP results - GI is following  Thrombocytopenia In setting of sepsis - continue to monitor   History of hypertension -Hold metoprolol due to above  Hypothyroidism - continue with synthroid  GERD - continue with pepcid  Diabetes mellitus -Patient with history of diabetes mellitus, but does not appear to be on any hypoglycemic agents, will monitor CBG for now .  Chronic back pain  -Continue with Depakote and  Zanaflex   Best practice (right click and "Reselect all SmartList Selections" daily)  Diet:  NPO Pain/Anxiety/Delirium protocol (if indicated): No VAP protocol (if indicated): Not  indicated DVT prophylaxis: SCD GI prophylaxis: H2B Glucose control:  SSI No Central venous access:  N/A Arterial line:  N/A Foley:  N/A Mobility:  bed rest  PT consulted: N/A Last date of multidisciplinary goals of care discussion [NA] multiple attempts made to reach son, messages left Code Status:  DNR Disposition:  Transfer to medical floor  Labs   CBC: Recent Labs  Lab 04/02/21 1218 04/03/21 0515  WBC 4.4 21.1*  NEUTROABS 3.5  --   HGB 11.8* 10.4*  HCT 36.9 32.8*  MCV 109.5* 109.3*  PLT 89* 86*    Basic Metabolic Panel: Recent Labs  Lab 04/02/21 1218 04/03/21 0515  NA 136 140  K 4.0 4.4  CL 100 108  CO2 21* 23  GLUCOSE 172* 78  BUN 27* 26*  CREATININE 0.63 0.54  CALCIUM 8.6* 7.9*   GFR: Estimated Creatinine Clearance: 27.6 mL/min (by C-G formula based on SCr of 0.54 mg/dL). Recent Labs  Lab 04/02/21 1218 04/02/21 1804 04/03/21 0515 04/03/21 0807  PROCALCITON 5.57  --  7.63  --   WBC 4.4  --  21.1*  --   LATICACIDVEN  --  4.1*  --  1.6    Liver Function Tests: Recent Labs  Lab 04/02/21 1218 04/03/21 0515  AST 897* 385*  ALT 434* 270*  ALKPHOS 249* 159*  BILITOT 2.4* 1.3*  PROT 6.6 5.6*  ALBUMIN 3.1* 2.4*   Recent Labs  Lab 04/02/21 1218 04/03/21 0515  LIPASE 472* 112*   Recent Labs  Lab 04/04/21 0025  AMMONIA 43*    ABG    Component Value Date/Time   PHART 7.375 05/24/2009 1123   PCO2ART 37.1 05/24/2009 1123   PO2ART 65.0 (L) 05/24/2009 1123   HCO3 21.7 05/24/2009 1123   TCO2 26 05/24/2009 1736   ACIDBASEDEF 3.0 (H) 05/24/2009 1123   O2SAT 92.0 05/24/2009 1123     Coagulation Profile: Recent Labs  Lab 04/03/21 0515  INR 1.6*    Cardiac Enzymes: No results for input(s): CKTOTAL, CKMB, CKMBINDEX, TROPONINI in the last 168 hours.  HbA1C: Hgb A1c MFr Bld  Date/Time Value Ref Range Status  10/08/2017 11:59 PM 6.3 (H) 4.8 - 5.6 % Final    Comment:    (NOTE) Pre diabetes:          5.7%-6.4% Diabetes:               >6.4% Glycemic control for   <7.0% adults with diabetes   05/20/2009 12:59 PM  4.6 - 6.1 % Final   5.9 (NOTE) The ADA recommends the following therapeutic goal for glycemic control related to Hgb A1c measurement: Goal of therapy: <6.5 Hgb A1c  Reference: American Diabetes Association: Clinical Practice Recommendations 2010, Diabetes Care, 2010, 33: (Suppl  1).    CBG: Recent Labs  Lab 04/03/21 1915 04/03/21 2011 04/03/21 2341 04/04/21 0459 04/04/21 0756  GLUCAP 42* 130* 71 40* 115*    Freda Jackson, MD [facility] Office: [phone]   See Amion for personal pager PCCM on call pager [phone] until 7pm. Please call Elink 7p-7a. [phone]

## 2021-04-04 NOTE — Evaluation (Signed)
Occupational Therapy Evaluation Patient Details Name: [name] MRN: [id number] DOB: 1925-01-11 Today's Date: 04/04/2021    History of Present Illness THis 87 y.o. female admitted to [facility] with abdominal pain.  CT of abdomen showed pancreatic inflammation due to likey acute pancreatitis.  she developed septic shock likely due to intra-abdominal infection and cholangitis.  She was transferred to [facility] for possible ERCP.  PMH includes: rheumatic heart disease, PFO, DM, degenerative lumbar spinal stenosis, COPD, CHF, A-FIB, s/p MVR   Clinical Impression   Pt admitted with above. She demonstrates the below listed deficits and will benefit from continued OT to maximize safety and independence with BADLs.  Pt presents to OT with generalized weakness, decreased activity tolerance, impaired balance, and impaired cognition.  She requires mod A for functional transfers and min A - max A for ADLs.  She resides in a LTC facility and was ambulating (per pt report), and required assist with ADLs (per pt report).  Will follow acutely.       Follow Up Recommendations  SNF    Equipment Recommendations  None recommended by OT    Recommendations for Other Services       Precautions / Restrictions Precautions Precautions: Fall Precaution Comments: per pt, she has a h/o falls, but she is a poor historian      Mobility Bed Mobility Overal bed mobility: Needs Assistance Bed Mobility: Supine to Sit     Supine to sit: Min assist     General bed mobility comments: requires increased time, and asisst to scoot hips to the EOB    Transfers Overall transfer level: Needs assistance Equipment used: 1 person hand held assist Transfers: Sit to/from [employer] Sit to Stand: Min assist Stand pivot transfers: Mod assist       General transfer comment: assistance to power up into standing.  She demonstrates heavy posterior lean and is fearful of following with transfer    Balance  Overall balance assessment: Needs assistance Sitting-balance support: No upper extremity supported Sitting balance-Leahy Scale: Fair Sitting balance - Comments: able to maintain static sitting with min guard assist   Standing balance support: Bilateral upper extremity supported Standing balance-Leahy Scale: Poor Standing balance comment: requires assist for balance and bil. UE support                           ADL either performed or assessed with clinical judgement   ADL Overall ADL's : Needs assistance/impaired Eating/Feeding: NPO   Grooming: Wash/dry hands;Wash/dry face;Oral care;Brushing hair;Minimal assistance;Sitting   Upper Body Bathing: Minimal assistance;Sitting   Lower Body Bathing: Moderate assistance;Sit to/from stand   Upper Body Dressing : Moderate assistance;Sitting   Lower Body Dressing: Maximal assistance;Sit to/from stand Lower Body Dressing Details (indicate cue type and reason): pt unable to place socks over toes, but she was able to reach down to her ankles to adjust her socks Toilet Transfer: Moderate assistance;Stand-pivot;BSC   Toileting- Clothing Manipulation and Hygiene: Maximal assistance;Sit to/from stand       Functional mobility during ADLs: Moderate assistance       Vision Baseline Vision/History: Wears glasses Wears Glasses: At all times Patient Visual Report: No change from baseline       Perception     Praxis      Pertinent Vitals/Pain Pain Assessment: Faces Faces Pain Scale: No hurt     Hand Dominance Right   Extremity/Trunk Assessment Upper Extremity Assessment Upper Extremity Assessment: Generalized weakness  Lower Extremity Assessment Lower Extremity Assessment: Generalized weakness   Cervical / Trunk Assessment Cervical / Trunk Assessment: Kyphotic   Communication Communication Communication: No difficulties   Cognition Arousal/Alertness: Awake/alert Behavior During Therapy: WFL for tasks  assessed/performed Overall Cognitive Status: No family/caregiver present to determine baseline cognitive functioning                                 General Comments: Pt is a poor historian and provides inconsistent responses frequently   General Comments  VSS with pt on 5L supplemental 02    Exercises     Shoulder Instructions      Home Living Family/patient expects to be discharged to:: Skilled nursing facility                                 Additional Comments: Pt is a LTC resident at [facility]      Prior Functioning/Environment Level of Independence: Needs assistance  Gait / Transfers Assistance Needed: Pt reports she ambulates with walker, but says she falls.  She is a poor historian ADL's / Homemaking Assistance Needed: Pt states she does her own ADLs, but she is a poor historian            OT Problem List: Decreased strength;Decreased activity tolerance;Impaired balance (sitting and/or standing);Decreased cognition;Decreased safety awareness;Decreased knowledge of use of DME or AE;Cardiopulmonary status limiting activity      OT Treatment/Interventions: Self-care/ADL training;DME and/or AE instruction;Therapeutic activities;Cognitive remediation/compensation;Patient/family education;Balance training    OT Goals(Current goals can be found in the care plan section) Acute Rehab OT Goals Patient Stated Goal: Pt didn't state OT Goal Formulation: With patient Time For Goal Achievement: 04/18/21 Potential to Achieve Goals: Good ADL Goals Pt Will Perform Grooming: with min assist;standing Pt Will Perform Upper Body Bathing: with set-up;with supervision;sitting Pt Will Perform Lower Body Bathing: with mod assist;sit to/from stand Pt Will Transfer to Toilet: with min assist;ambulating;regular height toilet;bedside commode;grab bars Pt Will Perform Toileting - Clothing Manipulation and hygiene: with mod assist;sit to/from stand  OT  Frequency: Min 2X/week   Barriers to D/C:            Co-evaluation              AM-PAC OT "6 Clicks" Daily Activity     Outcome Measure Help from another person eating meals?: Total Help from another person taking care of personal grooming?: A Little Help from another person toileting, which includes using toliet, bedpan, or urinal?: A Lot Help from another person bathing (including washing, rinsing, drying)?: A Lot Help from another person to put on and taking off regular upper body clothing?: A Lot Help from another person to put on and taking off regular lower body clothing?: A Lot 6 Click Score: 12   End of Session Equipment Utilized During Treatment: Oxygen Nurse Communication: Mobility status  Activity Tolerance: Patient tolerated treatment well Patient left: in chair;with call bell/phone within reach  OT Visit Diagnosis: Unsteadiness on feet (R26.81);Cognitive communication deficit (R41.841);Muscle weakness (generalized) (M62.81)                Time: 7062-3762 OT Time Calculation (min): 19 min Charges:  OT General Charges $OT Visit: 1 Visit OT Evaluation $OT Eval Moderate Complexity: 1 Mod  Eber Jones., OTR/L Acute Rehabilitation Services Pager [phone] Office [phone]   Jeani Hawking M 04/04/2021,  5:21 PM

## 2021-04-04 NOTE — Progress Notes (Deleted)
eLink Physician-Brief Progress Note Patient Name: [name] DOB: 02-Jan-1925 MRN: [id number]   Date of Service  04/04/2021  HPI/Events of Note    eICU Interventions          Migdalia Dk 04/04/2021, 12:04 AM

## 2021-04-05 ENCOUNTER — Inpatient Hospital Stay (HOSPITAL_COMMUNITY): Payer: Medicare Other | Admitting: Anesthesiology

## 2021-04-05 ENCOUNTER — Encounter (HOSPITAL_COMMUNITY)
Admission: EM | Disposition: A | Discharge status: Skilled Nursing Facility | Payer: Self-pay | Source: Skilled Nursing Facility | Attending: Internal Medicine

## 2021-04-05 ENCOUNTER — Encounter (HOSPITAL_COMMUNITY): Payer: Self-pay | Admitting: Internal Medicine

## 2021-04-05 ENCOUNTER — Inpatient Hospital Stay (HOSPITAL_COMMUNITY): Payer: Medicare Other

## 2021-04-05 DIAGNOSIS — F0390 Unspecified dementia without behavioral disturbance: Secondary | ICD-10-CM

## 2021-04-05 DIAGNOSIS — K8309 Other cholangitis: Secondary | ICD-10-CM

## 2021-04-05 DIAGNOSIS — K8050 Calculus of bile duct without cholangitis or cholecystitis without obstruction: Secondary | ICD-10-CM

## 2021-04-05 HISTORY — PX: SPHINCTEROTOMY: SHX5279

## 2021-04-05 HISTORY — PX: ERCP: SHX5425

## 2021-04-05 LAB — COMPREHENSIVE METABOLIC PANEL
ALT: 100 U/L — ABNORMAL HIGH (ref 0–44)
AST: 52 U/L — ABNORMAL HIGH (ref 15–41)
Albumin: 1.9 g/dL — ABNORMAL LOW (ref 3.5–5.0)
Alkaline Phosphatase: 108 U/L (ref 38–126)
Anion gap: 6 (ref 5–15)
BUN: 9 mg/dL (ref 8–23)
CO2: 26 mmol/L (ref 22–32)
Calcium: 8.1 mg/dL — ABNORMAL LOW (ref 8.9–10.3)
Chloride: 103 mmol/L (ref 98–111)
Creatinine, Ser: 0.47 mg/dL (ref 0.44–1.00)
GFR, Estimated: >60 mL/min (ref >60)
Glucose, Bld: 81 mg/dL (ref 70–99)
Potassium: 2.4 mmol/L — CL (ref 3.5–5.1)
Sodium: 135 mmol/L (ref 135–145)
Total Bilirubin: 0.6 mg/dL (ref 0.3–1.2)
Total Protein: 5.2 g/dL — ABNORMAL LOW (ref 6.5–8.1)

## 2021-04-05 LAB — POCT I-STAT, CHEM 8
BUN: 9 mg/dL (ref 8–23)
Calcium, Ion: 1.22 mmol/L (ref 1.15–1.40)
Chloride: 100 mmol/L (ref 98–111)
Creatinine, Ser: 0.3 mg/dL — ABNORMAL LOW (ref 0.44–1.00)
Glucose, Bld: 60 mg/dL — ABNORMAL LOW (ref 70–99)
HCT: 36 % (ref 36.0–46.0)
Hemoglobin: 12.2 g/dL (ref 12.0–15.0)
Potassium: 3.7 mmol/L (ref 3.5–5.1)
Sodium: 138 mmol/L (ref 135–145)
TCO2: 28 mmol/L (ref 22–32)

## 2021-04-05 LAB — CBC
HCT: 31.5 % — ABNORMAL LOW (ref 36.0–46.0)
Hemoglobin: 10.9 g/dL — ABNORMAL LOW (ref 12.0–15.0)
MCH: 34.9 pg — ABNORMAL HIGH (ref 26.0–34.0)
MCHC: 34.6 g/dL (ref 30.0–36.0)
MCV: 101 fL — ABNORMAL HIGH (ref 80.0–100.0)
Platelets: 72 10*3/uL — ABNORMAL LOW (ref 150–400)
RBC: 3.12 MIL/uL — ABNORMAL LOW (ref 3.87–5.11)
RDW: 13.3 % (ref 11.5–15.5)
WBC: 10.9 10*3/uL — ABNORMAL HIGH (ref 4.0–10.5)
nRBC: 0 % (ref 0.0–0.2)

## 2021-04-05 LAB — POTASSIUM: Potassium: 5 mmol/L (ref 3.5–5.1)

## 2021-04-05 LAB — PROTIME-INR
INR: 1.1 (ref 0.8–1.2)
Prothrombin Time: 14.3 seconds (ref 11.4–15.2)

## 2021-04-05 LAB — PROCALCITONIN: Procalcitonin: 2.38 ng/mL

## 2021-04-05 LAB — GLUCOSE, CAPILLARY: Glucose-Capillary: 95 mg/dL (ref 70–99)

## 2021-04-05 LAB — MAGNESIUM: Magnesium: 1.5 mg/dL — ABNORMAL LOW (ref 1.7–2.4)

## 2021-04-05 LAB — BRAIN NATRIURETIC PEPTIDE: B Natriuretic Peptide: 407.3 pg/mL — ABNORMAL HIGH (ref 0.0–100.0)

## 2021-04-05 SURGERY — ERCP, WITH INTERVENTION IF INDICATED
Anesthesia: General

## 2021-04-05 MED ORDER — DEXAMETHASONE SODIUM PHOSPHATE 10 MG/ML IJ SOLN
INTRAMUSCULAR | Status: DC | PRN
  Administered 2021-04-05: 5 mg via INTRAVENOUS

## 2021-04-05 MED ORDER — PHENYLEPHRINE 40 MCG/ML (10ML) SYRINGE FOR IV PUSH (FOR BLOOD PRESSURE SUPPORT)
PREFILLED_SYRINGE | INTRAVENOUS | Status: DC | PRN
  Administered 2021-04-05: 120 ug via INTRAVENOUS

## 2021-04-05 MED ORDER — DEXTROSE 50 % IV SOLN
12.5000 g | INTRAVENOUS | Status: AC
  Administered 2021-04-05: 12.5 g via INTRAVENOUS

## 2021-04-05 MED ORDER — POTASSIUM CHLORIDE 10 MEQ/100ML IV SOLN
10.0000 meq | INTRAVENOUS | Status: AC
  Administered 2021-04-05 (×5): 10 meq via INTRAVENOUS
  Filled 2021-04-05 (×5): qty 100

## 2021-04-05 MED ORDER — GLUCAGON HCL RDNA (DIAGNOSTIC) 1 MG IJ SOLR
INTRAMUSCULAR | Status: AC
  Filled 2021-04-05: qty 1

## 2021-04-05 MED ORDER — ETOMIDATE 2 MG/ML IV SOLN
INTRAVENOUS | Status: DC | PRN
  Administered 2021-04-05: 10 mg via INTRAVENOUS

## 2021-04-05 MED ORDER — SODIUM CHLORIDE 0.9 % IV SOLN: INTRAVENOUS | Status: DC | PRN

## 2021-04-05 MED ORDER — FENTANYL CITRATE (PF) 250 MCG/5ML IJ SOLN
INTRAMUSCULAR | Status: DC | PRN
  Administered 2021-04-05: 25 ug via INTRAVENOUS

## 2021-04-05 MED ORDER — CARVEDILOL 3.125 MG PO TABS
3.1250 mg | ORAL_TABLET | Freq: Two times a day (BID) | ORAL | Status: DC
  Administered 2021-04-05 – 2021-04-06 (×2): 3.125 mg via ORAL
  Filled 2021-04-05 (×2): qty 1

## 2021-04-05 MED ORDER — SODIUM CHLORIDE 0.9 % IV SOLN
INTRAVENOUS | Status: DC | PRN
  Administered 2021-04-05: 25 mL

## 2021-04-05 MED ORDER — LORAZEPAM 0.5 MG PO TABS
0.5000 mg | ORAL_TABLET | Freq: Three times a day (TID) | ORAL | Status: DC | PRN
  Administered 2021-04-06: 0.5 mg via ORAL
  Filled 2021-04-05: qty 1

## 2021-04-05 MED ORDER — INDOMETHACIN 50 MG RE SUPP
RECTAL | Status: AC
  Filled 2021-04-05: qty 2

## 2021-04-05 MED ORDER — DEXTROSE 50 % IV SOLN
INTRAVENOUS | Status: AC
  Filled 2021-04-05: qty 50

## 2021-04-05 MED ORDER — MAGNESIUM SULFATE 2 GM/50ML IV SOLN
2.0000 g | Freq: Once | INTRAVENOUS | Status: AC
  Administered 2021-04-05: 2 g via INTRAVENOUS
  Filled 2021-04-05: qty 50

## 2021-04-05 MED ORDER — HEPARIN SODIUM (PORCINE) 5000 UNIT/ML IJ SOLN
5000.0000 [IU] | Freq: Three times a day (TID) | INTRAMUSCULAR | Status: DC
  Administered 2021-04-05 – 2021-04-06 (×2): 5000 [IU] via SUBCUTANEOUS
  Filled 2021-04-05 (×2): qty 1

## 2021-04-05 MED ORDER — FENTANYL CITRATE (PF) 100 MCG/2ML IJ SOLN: 25.0000 ug | INTRAMUSCULAR | Status: DC | PRN

## 2021-04-05 MED ORDER — ROCURONIUM BROMIDE 10 MG/ML (PF) SYRINGE
PREFILLED_SYRINGE | INTRAVENOUS | Status: DC | PRN
  Administered 2021-04-05: 40 mg via INTRAVENOUS

## 2021-04-05 MED ORDER — LIDOCAINE 2% (20 MG/ML) 5 ML SYRINGE
INTRAMUSCULAR | Status: DC | PRN
  Administered 2021-04-05: 100 mg via INTRAVENOUS

## 2021-04-05 MED ORDER — INDOMETHACIN 50 MG RE SUPP
RECTAL | Status: DC | PRN
  Administered 2021-04-05: 100 mg via RECTAL

## 2021-04-05 MED ORDER — CIPROFLOXACIN IN D5W 400 MG/200ML IV SOLN
INTRAVENOUS | Status: AC
  Filled 2021-04-05: qty 200

## 2021-04-05 MED ORDER — ONDANSETRON HCL 4 MG/2ML IJ SOLN
INTRAMUSCULAR | Status: DC | PRN
  Administered 2021-04-05: 4 mg via INTRAVENOUS

## 2021-04-05 MED ORDER — SUGAMMADEX SODIUM 200 MG/2ML IV SOLN
INTRAVENOUS | Status: DC | PRN
  Administered 2021-04-05: 150 mg via INTRAVENOUS

## 2021-04-05 MED ORDER — KCL IN DEXTROSE-NACL 40-5-0.9 MEQ/L-%-% IV SOLN
INTRAVENOUS | Status: DC
  Filled 2021-04-05 (×2): qty 1000

## 2021-04-05 MED ORDER — POTASSIUM CHLORIDE CRYS ER 20 MEQ PO TBCR
40.0000 meq | EXTENDED_RELEASE_TABLET | Freq: Once | ORAL | Status: AC
  Administered 2021-04-05: 40 meq via ORAL
  Filled 2021-04-05: qty 2

## 2021-04-05 MED ORDER — MIRTAZAPINE 15 MG PO TABS
7.5000 mg | ORAL_TABLET | Freq: Every day | ORAL | Status: DC
  Administered 2021-04-05: 7.5 mg via ORAL
  Filled 2021-04-05: qty 1

## 2021-04-05 NOTE — Consult Note (Signed)
Consultation Note Date: 04/05/2021   Patient Name: [name]  DOB: 1925-05-01  MRN: [id number]  Age / Sex: 87 y.o., female  PCP: Neale Burly, MD Referring Physician: Thurnell Lose, MD  Reason for Consultation: Establishing goals of care  HPI/Patient Profile: 87 y.o. female  with past medical history of COPD, diabetes, GERD, hypothyroidism, PFO, rheumatic heart disease status post mitral valve replacement, remote atrial fibrillation post Cox-Maze procedure maintaining sinus rhythm. She presented to [facility] emergency department on 04/02/2021 with complaint abdominal pain. She also reported weight loss and poor appetite. In the ED, CT abdomen pelvis significant for intra-/extra biliary ductal dilatation and pancreatic inflammation. Labs significant for elevated liver enzymes (alk phos 249, AST 897, ALT 434), total bilirubin 2.4 and lipase 472. Patient admitted to [facility] for further management of acute pancreatitis. She developed septic shock requiring transient treatment with levophed, and then transferred to [facility] on 4/15 for possible ERCP.   Clinical Assessment and Goals of Care: I have reviewed medical records including EPIC notes, labs and imaging, and examined the patient. She is status post ERCP today and currently sleeping.   I spoke with son/Ray by phone  to discuss diagnosis, prognosis, GOC, EOL wishes, disposition, and options. I introduced Palliative Medicine as specialized medical care for people living with serious illness. It focuses on providing relief from the symptoms and stress of a serious illness.   We discussed a brief life review of the patient. Ray describes his mother as "very independent" and that it is "her way or no way". She worked hard all her life, mostly in the [employer]. She had 4 sons; however sadly 3 of them are deceased. Her husband passed away around 25 years  ago.  Ray reports a significant decline in his mother's functional status around 6-8 months ago. He reports "she couldn't hardly do anything for herself", was having frequent falls at home, and was forgetting to take her medications. Due to this decline, she could no longer safely live alone in her home. However, moving into the SNF was a very difficult transition for her due to the loss of her independence.   We discussed her current illness and what it means in the larger context of her ongoing co-morbidities.  Natural disease trajectory of dementia and advanced age was discussed. Detailed discussion was had regarding the diagnosis of dementia and its natural trajectory. This includes decreased ability to communicate, ambulate, swallow, and maintain continence.   We reviewed that dementia is a progressive, non-curable disease underlying the patient's current acute medical conditions. We discussed specific indicators that patient has advanced dementia, including decreased oral intake and poor appetite over the past 4-5 months. Ray expresses concern that her decreased intake will be a life-limiting issue and expresses frustration that he can't "force her to eat".  Provided education and counseling that artificial feeding has not been shown to prolong life or promote quality of life in patients with dementia.    Ray also reports his mother has shown increasing  agitation and anxiety. I provided education that agitation is common with advanced dementia and discussed option to try treating this symptom with a low-dose benzodiazepine as needed.  The difference between aggressive medical intervention and comfort care was considered.  I introduced the concept of a comfort path to Ray and encouraged him to think about at what point patient/family would want to stop full scope medical interventions and focus on comfort rather than prolonging life.  Provided education and counseling at length on the philosophy and  benefits of hospice care. Discussed that it offers a holistic approach to care in the setting of end-stage illness/disease, and is about supporting the patient while allowing nature to take it's course. Discussed that hospice would provide an extra layer of care and support for Mrs Bihm at her SNF. Ray expresses that hospice is an "excellent service" and agrees his mother would benefit from this support.   Questions and concerns were addressed. Provided Ray with PMT contact info and encouraged him to call with questions or concerns.    Primary decision maker: Son Ray Pruitt    SUMMARY OF RECOMMENDATIONS    DNR/DNI as previously documented  Continue current medical care  Goal of care is return to SNF with hospice care when medically stable   TOC order placed for [facility]  PMT will continue to follow  Code Status/Advance Care Planning:  DNR  Symptom Management:   Lorazepam (ATIVAN) 0.5 mg po three times daily PRN anxiety/agitation  Palliative Prophylaxis:   Frequent Pain Assessment and Turn Reposition  Additional Recommendations (Limitations, Scope, Preferences):  No Artificial Feeding  Psycho-social/Spiritual:   Created space and opportunity for family to express thoughts and feelings regarding patient's current medical situation.   Emotional support provided   Prognosis:   < 6 months  Discharge Planning: [place] with Hospice      Primary Diagnoses: Present on Admission: . COPD (chronic obstructive pulmonary disease) ([place]) . GERD (gastroesophageal reflux disease) . Hypothyroidism . RHEUMATIC HEART DISEASE . Acute pancreatitis   I have reviewed the medical record, interviewed the patient and family, and examined the patient. The following aspects are pertinent.  Past Medical History:  Diagnosis Date  . Arrhythmia    Sinus tachycardia  . Atrial fibrillation (HCC)    status post Cox-Maze procedure maintaining normal sinus  rhythm, amiodarone and Coumadin discontinued.  . CHF (congestive heart failure) (HCC)    normal LV systolic function  . COPD (chronic obstructive pulmonary disease) ([place])   . Degenerative lumbar spinal stenosis    Severe degenerative disk disease of lumbosacral spine  . Diabetes ([place])   . GERD (gastroesophageal reflux disease)   . Hypothyroidism   . PFO (patent foramen ovale)    status post PFO closure at time of surgery  . Rheumatic heart disease    s/p right thoracotomy and mitral valve replacement with [employer]tronic Mosaic porcine tissue valve  . Syncope    recurrent unexplained syncope    Family History  Problem Relation Age of Onset  . Heart disease Father   . Heart attack Father   . Heart disease Brother   . Heart attack Brother   . Heart attack Son   . Heart disease Son   . Coronary artery disease Neg Hx   . Diabetes Neg Hx   . Hypertension Neg Hx    Scheduled Meds: . Chlorhexidine Gluconate Cloth  6 each Topical Daily  . famotidine  20 mg Oral Daily  . levothyroxine  100 mcg Oral QAC breakfast  . sodium chloride flush  3 mL Intravenous Q12H  . tiZANidine  2 mg Oral QHS   Continuous Infusions: . magnesium sulfate bolus IVPB 2 g (04/05/21 0828)  . piperacillin-tazobactam (ZOSYN)  IV 3.375 g (04/05/21 0758)  . potassium chloride 10 mEq (04/05/21 0751)  . valproate sodium 125 mg (04/05/21 0559)   PRN Meds:.nitroGLYCERIN, ondansetron **OR** ondansetron (ZOFRAN) IV, oxyCODONE Medications Prior to Admission:  Prior to Admission medications   Medication Sig Start Date End Date Taking? Authorizing Provider  acetaminophen (TYLENOL) 500 MG tablet Take 500 mg by mouth every 6 (six) hours as needed.   Yes [provider]  cholecalciferol (VITAMIN D) 25 MCG (1000 UNIT) tablet Take 1 tablet by mouth daily.   Yes [provider]  divalproex (DEPAKOTE) 125 MG DR tablet Take 125 mg by mouth 3 (three) times daily.   Yes [provider]  famotidine (PEPCID)  20 MG tablet Take 20 mg by mouth daily.   Yes [provider]  ferrous sulfate 325 (65 FE) MG tablet Take 325 mg by mouth 2 (two) times daily with a meal.   Yes [provider]  fluticasone (FLONASE SENSIMIST) 27.5 MCG/SPRAY nasal spray Place 1 spray into the nose daily.   Yes [provider]  guaiFENesin-dextromethorphan (ROBITUSSIN DM) 100-10 MG/5ML syrup Take 10 mLs by mouth every 4 (four) hours as needed for cough.   Yes [provider]  levothyroxine (SYNTHROID, LEVOTHROID) 100 MCG tablet Take 100 mcg by mouth daily before breakfast.   Yes [provider]  lidocaine (LIDODERM) 5 % Place 1 patch onto the skin daily. Remove & Discard patch within 12 hours or as directed by MD AT BEDTIME   Yes [provider]  loratadine (CLARITIN) 10 MG tablet Take 10 mg by mouth daily.   Yes [provider]  magnesium hydroxide (MILK OF MAGNESIA) 400 MG/5ML suspension Take 30 mLs by mouth daily as needed for mild constipation.   Yes [provider]  melatonin 5 MG TABS Take 5 mg by mouth at bedtime.   Yes [provider]  metoprolol succinate (TOPROL-XL) 25 MG 24 hr tablet Take 1 tablet (25 mg total) by mouth daily. Patient taking differently: Take 12.5 mg by mouth daily. 12.$RemoveBeforeD'5mg'jRpNHnabLuRjjI$  in morning for htn hold for bp 110/60 or HR60 02/10/16  Yes Herminio Commons, MD  mirtazapine (REMERON) 7.5 MG tablet Take 7.5 mg by mouth at bedtime.   Yes [provider]  OLANZapine (ZYPREXA) 2.5 MG tablet Take 1.25 mg by mouth at bedtime.   Yes [provider]  senna (SENOKOT) 8.6 MG tablet Take 2 tablets by mouth daily.   Yes [provider]  tizanidine (ZANAFLEX) 2 MG capsule Take 2 mg by mouth at bedtime.   Yes [provider]   Allergies  Allergen Reactions  . Sulfonamide Derivatives Anaphylaxis, Shortness Of Breath and Swelling  . Aspirin     Uncoated Aspirin = stomach upset/pain EC Aspirin is ok    Physical Exam Vitals reviewed.  Constitutional:      General: She is sleeping. She is not in acute distress.    Comments: Appears frail  Cardiovascular:     Rate and Rhythm: Normal rate and regular rhythm.  Pulmonary:     Effort: Pulmonary effort is normal.     Vital Signs: BP (!) 146/70 (BP Location: Left Arm)   Pulse 78   Temp 97.8 F (36.6 C) (Oral)   Resp  18   Ht $R'5\' 2"'BM$  (1.575 m)   Wt 39.8 kg   SpO2 100%   BMI 16.05 kg/m  Pain Scale: 0-10   Pain Score: 0-No pain   SpO2: SpO2: 100 % O2 Device:SpO2: 100 % O2 Flow Rate: .O2 Flow Rate (L/min): 5 L/min  IO: Intake/output summary:   Intake/Output Summary (Last 24 hours) at 04/05/2021 0852 Last data filed at 04/05/2021 0801 Gross per 24 hour  Intake 869.83 ml  Output 2880 ml  Net -2010.17 ml    LBM: Last BM Date: 04/02/21 Baseline Weight: Weight: 36.3 kg Most recent weight: Weight: 39.8 kg      Palliative Assessment/Data: PPS 30%     Time In: 1600 Time Out: 1713 Time Total: 73 minutes Greater than 50%  of this time was spent counseling and coordinating care related to the above assessment and plan.  Signed by: Lavena Bullion, NP   Please contact Palliative Medicine Team phone at [phone] for questions and concerns.  For individual provider: See Shea Evans

## 2021-04-05 NOTE — Progress Notes (Signed)
Hypoglycemic Event  CBG: 60  Treatment:per orders 12.5g of dextrose IV  Symptoms:none  Follow-up CBG: Time:1405 CBG Result:85  Possible Reasons for Event: NPO for procedure  Comments/MD notified:MD Green notified   Beatrix Shipper

## 2021-04-05 NOTE — Progress Notes (Signed)
PROGRESS NOTE                                                                                                                                                                                                             Patient Demographics:    [name], is a 87 y.o. female, DOB - 07-Nov-1925, MVH:[id number]  Outpatient Primary MD for the patient is Toma Deiters, MD    LOS - 3  Admit date - 04/02/2021    Chief Complaint  Patient presents with  . Abdominal Pain       Brief Narrative (HPI from H&P) - 87 year old nursing home resident admitted with abdominal pain .  She reports black stools for "1 year" and just feeling bad.  Her work-up was consistent with possible CBD obstruction along with some pancreatic inflammation, she was admitted by critical care seen by GI and transferred to hospitalist service on 04/05/2021 more day 3 of her hospital stay.   Subjective:    [name] today has, No headache, No chest pain, No abdominal pain - No Nausea, No new weakness tingling or numbness, no SOB.   Assessment  & Plan :     1.  Sepsis due to acute cholangitis along with nonspecific acute pancreatitis with possible CBD obstruction.  She has been treated and stabilized in ICU, continue Zosyn along with gentle IV fluids.  Sepsis pathophysiology has resolved.  MRCP noted.  Defer ERCP to GI.  Currently clinically much improved.  LFTs are trending down.  2.  Thrombocytopenia due to sepsis.  Gradually improving.  Monitor no signs of bleeding.  3.  Chronic back pain.  Supportive care  4.  Hypertension.  Add low-dose Coreg and monitor.  5.  GERD.  On Pepcid.  6.  Hypothyroidism.  Continue Synthroid at home dose.  7.  Severe hypokalemia and hypomagnesemia.  Replaced.  8.  DM type II.  Sliding scale for now.  Lab Results  Component Value Date   HGBA1C 6.3 (H) 10/08/2017   CBG (last 3)  Recent Labs     04/04/21 1524 04/04/21 1912 04/04/21 2330  GLUCAP 93 125* 113*         Condition - Extremely Guarded  Family Communication  :  Ray [phone] on 04/05/2021 at 8:53 AM, over 10 rings no response  Code Status :  DNR  Consults  :  PCCM, GI  PUD Prophylaxis : Pepcid   Procedures  :     MRCP - 1. Moderately motion degraded exam. 2. Chronic biliary duct dilatation back to 01/25/2020. Narrowing at and just above the ampulla with suggestion of T2 hypointensity at the ampulla. Cannot exclude ampullary stricture or stone. If this 87 year old patient is a treatment candidate, consider ERCP. 3. Equivocal 2 mm mid common duct dependent stone, not a cause of obstruction. 4. Mild non complicated pancreatitis. 5. Subtle foci of rounded heterogeneous enhancement within the liver,a new compared to 01/25/2020 CT. In this patient with chronic biliary duct dilatation, developing abscesses are a concern. Suboptimally evaluated secondary to motion on the current exam. 6. Bilateral pleural effusions and ascites, suggesting fluid overload. 7.  Aortic Atherosclerosis      Disposition Plan  :    Status is: Inpatient  Remains inpatient appropriate because:IV treatments appropriate due to intensity of illness or inability to take PO   Dispo: The patient is from: Home              Anticipated d/c is to: Home              Patient currently is not medically stable to d/c.   Difficult to place patient No   DVT Prophylaxis  :    Heparin    Lab Results  Component Value Date   PLT 72 (L) 04/05/2021    Diet :  Diet Order            Diet NPO time specified Except for: Sips with Meds  Diet effective midnight                  Inpatient Medications  Scheduled Meds: . Chlorhexidine Gluconate Cloth  6 each Topical Daily  . famotidine  20 mg Oral Daily  . levothyroxine  100 mcg Oral QAC breakfast  . sodium chloride flush  3 mL Intravenous Q12H  . tiZANidine  2 mg Oral QHS   Continuous  Infusions: . magnesium sulfate bolus IVPB 2 g (04/05/21 0828)  . piperacillin-tazobactam (ZOSYN)  IV 3.375 g (04/05/21 0758)  . potassium chloride 10 mEq (04/05/21 0751)  . valproate sodium 125 mg (04/05/21 0559)   PRN Meds:.nitroGLYCERIN, ondansetron **OR** ondansetron (ZOFRAN) IV, oxyCODONE  Antibiotics  :    Anti-infectives (From admission, onward)   Start     Dose/Rate Route Frequency Ordered Stop   04/03/21 0000  piperacillin-tazobactam (ZOSYN) IVPB 3.375 g        3.375 g 12.5 mL/hr over 240 Minutes Intravenous Every 8 hours 04/02/21 1618     04/02/21 1600  piperacillin-tazobactam (ZOSYN) IVPB 3.375 g        3.375 g 100 mL/hr over 30 Minutes Intravenous  Once 04/02/21 1553 04/02/21 1647       Time Spent in minutes  30   Susa Raring M.D on 04/05/2021 at 8:52 AM  To page go to [email or web address]   [facility] -  Office  [phone]   See all Orders from today for further details    Objective:   Vitals:   04/05/21 0000 04/05/21 0200 04/05/21 0321 04/05/21 0758  BP: 124/60 (!) 126/51 137/65 (!) 146/70  Pulse: 84 75 77 78  Resp: (!) 23 (!) Temp:   98.4 F (36.9 C) 97.8 F (36.6 C)  TempSrc:    Oral  SpO2: 97% 96% 100% 100%  Weight:      Height:  Wt Readings from Last 3 Encounters:  04/02/21 39.8 kg  12/31/19 57.6 kg  01/19/19 58.4 kg     Intake/Output Summary (Last 24 hours) at 04/05/2021 0852 Last data filed at 04/05/2021 0801 Gross per 24 hour  Intake 869.83 ml  Output 2880 ml  Net -2010.17 ml     Physical Exam  Awake Alert, No new F.N deficits,   [place].AT,PERRAL Supple Neck,No JVD, No cervical lymphadenopathy appriciated.  Symmetrical Chest wall movement, Good air movement bilaterally, CTAB RRR,No Gallops,Rubs or new Murmurs, No Parasternal Heave +ve B.Sounds, Abd Soft, No tenderness, No organomegaly appriciated, No rebound - guarding or rigidity. No Cyanosis, Clubbing or edema, No new Rash or bruise       Data Review:     CBC Recent Labs  Lab 04/02/21 1218 04/03/21 0515 04/04/21 0100 04/05/21 0539  WBC 4.4 21.1* 12.9* 10.9*  HGB 11.8* 10.4* 9.2* 10.9*  HCT 36.9 32.8* 28.8* 31.5*  PLT 89* 86* 73* 72*  MCV 109.5* 109.3* 107.1* 101.0*  MCH 35.0* 34.7* 34.2* 34.9*  MCHC 32.0 31.7 31.9 34.6  RDW 13.3 13.4 13.6 13.3  LYMPHSABS 0.2*  --   --   --   MONOABS 0.0*  --   --   --   EOSABS 0.0  --   --   --   BASOSABS 0.0  --   --   --     Recent Labs  Lab 04/02/21 1218 04/02/21 1804 04/03/21 0515 04/03/21 0807 04/03/21 1410 04/04/21 0025 04/04/21 0100 04/04/21 1454 04/05/21 0539 04/05/21 0641 04/05/21 0740  NA 136  --  140  --   --   --  134*  --  135  --   --   K 4.0  --  4.4  --   --   --  3.0*  --  2.4*  --   --   CL 100  --  108  --   --   --  107  --  103  --   --   CO2 21*  --  23  --   --   --  18*  --  26  --   --   GLUCOSE 172*  --  78  --   --   --  90  --  81  --   --   BUN 27*  --  26*  --   --   --  14  --  9  --   --   CREATININE 0.63  --  0.54  --   --   --  0.48  --  0.47  --   --   CALCIUM 8.6*  --  7.9*  --   --   --  7.9*  --  8.1*  --   --   AST 897*  --  385*  --   --   --  92*  --  52*  --   --   ALT 434*  --  270*  --   --   --  117*  --  100*  --   --   ALKPHOS 249*  --  159*  --   --   --  126  --  108  --   --   BILITOT 2.4*  --  1.3*  --   --   --  0.9  --  0.6  --   --   ALBUMIN 3.1*  --  2.4*  --   --   --  1.7*  --  1.9*  --   --   MG  --   --   --   --   --   --   --   --   --  1.5*  --   CRP  --   --   --   --  16.4*  --   --   --   --   --   --   PROCALCITON 5.57  --  7.63  --   --   --   --   --   --   --   --   LATICACIDVEN  --  4.1*  --  1.6  --   --   --   --   --   --   --   INR  --   --  1.6*  --   --   --   --  1.3*  --   --  1.1  AMMONIA  --   --   --   --   --  43*  --   --   --   --   --   BNP  --   --   --   --   --   --   --   --   --   --  407.3*     ------------------------------------------------------------------------------------------------------------------ No results for input(s): CHOL, HDL, LDLCALC, TRIG, CHOLHDL, LDLDIRECT in the last 72 hours.  Lab Results  Component Value Date   HGBA1C 6.3 (H) 10/08/2017   ------------------------------------------------------------------------------------------------------------------ No results for input(s): TSH, T4TOTAL, T3FREE, THYROIDAB in the last 72 hours.  Invalid input(s): FREET3  Cardiac Enzymes No results for input(s): CKMB, TROPONINI, MYOGLOBIN in the last 168 hours.  Invalid input(s): CK ------------------------------------------------------------------------------------------------------------------    Component Value Date/Time   BNP 407.3 (H) 04/05/2021 0740    Micro Results Recent Results (from the past 240 hour(s))  Culture, blood (routine x 2)     Status: None (Preliminary result)   Collection Time: 04/02/21  6:04 PM   Specimen: BLOOD LEFT HAND  Result Value Ref Range Status   Specimen Description BLOOD LEFT HAND  Final   Special Requests   Final    BOTTLES DRAWN AEROBIC ONLY Blood Culture results may not be optimal due to an inadequate volume of blood received in culture bottles   Culture   Final    NO GROWTH 3 DAYS Performed at [facility], [address], [place], [place] [address]    Report Status PENDING  Incomplete  MRSA PCR Screening     Status: None   Collection Time: 04/02/21  6:48 PM   Specimen: Nasal Mucosa; Nasopharyngeal  Result Value Ref Range Status   MRSA by PCR NEGATIVE NEGATIVE Final    Comment:        The GeneXpert MRSA Assay (FDA approved for NASAL specimens only), is one component of a comprehensive MRSA colonization surveillance program. It is not intended to diagnose MRSA infection nor to guide or monitor treatment for MRSA infections. Performed at [facility] [address] [place], [place] [address]   Culture, blood  (routine x 2)     Status: None (Preliminary result)   Collection Time: 04/02/21  8:12 PM   Specimen: BLOOD LEFT HAND  Result Value Ref Range Status   Specimen Description   Final    BLOOD LEFT HAND Performed at [facility], [address] [place], [place] [address]  Special Requests   Final    BOTTLES DRAWN AEROBIC ONLY Blood Culture adequate volume   Culture   Final    NO GROWTH 3 DAYS Performed at [facility], [address], [place], [place] [address]    Report Status PENDING  Incomplete  SARS CORONAVIRUS 2 (TAT 6-24 HRS) Nasopharyngeal Nasopharyngeal Swab     Status: None   Collection Time: 04/04/21  5:08 PM   Specimen: Nasopharyngeal Swab  Result Value Ref Range Status   SARS Coronavirus 2 NEGATIVE NEGATIVE Final    Comment: (NOTE) SARS-CoV-2 target nucleic acids are NOT DETECTED.  The SARS-CoV-2 RNA is generally detectable in upper and lower respiratory specimens during the acute phase of infection. Negative results do not preclude SARS-CoV-2 infection, do not rule out co-infections with other pathogens, and should not be used as the sole basis for treatment or other patient management decisions. Negative results must be combined with clinical observations, patient history, and epidemiological information. The expected result is Negative.  Fact Sheet for Patients: HairSlick.no  Fact Sheet for Healthcare Providers: quierodirigir.com  This test is not yet approved or cleared by the [place] FDA and  has been authorized for detection and/or diagnosis of SARS-CoV-2 by FDA under an Emergency Use Authorization (EUA). This EUA will remain  in effect (meaning this test can be used) for the duration of the COVID-19 declaration under Se ction 564(b)(1) of the Act, 21 U.S.C. section 360bbb-3(b)(1), unless the authorization is terminated or revoked sooner.  Performed at [facility], [address] [place], [place] [address]     Radiology Reports CT ABDOMEN PELVIS WO CONTRAST  Result Date: 04/02/2021 CLINICAL DATA:  Left lower quadrant pain for 2-3 days. EXAM: CT ABDOMEN AND PELVIS WITHOUT CONTRAST TECHNIQUE: Multidetector CT imaging of the abdomen and pelvis was performed following the standard protocol without IV contrast. COMPARISON:  CT angiogram of the chest, abdomen and pelvis 10/09/2017. FINDINGS: Lower chest: Mild cardiomegaly. Small pleural effusions, greater on the left. Dependent atelectasis. Hepatobiliary: No focal liver lesion is identified on this unenhanced exam. Intra and extrahepatic biliary ductal dilatation appears worse than on the prior CT. The gallbladder has been removed. Pancreas: The body and tail the pancreas are markedly atrophic. The head and neck of the pancreas appear swollen with surrounding stranding. Spleen: Normal in size without focal abnormality. Adrenals/Urinary Tract: Abnormal axis of orientation of the right kidney is unchanged. 2-3 cysts are seen in the lower pole the left kidney, also unchanged. The kidneys are otherwise unremarkable. The bladder is markedly distended. No urinary tract stones are seen. Adrenal glands are negative. Stomach/Bowel: Stomach is within normal limits. The appendix is not visualized but no pericecal inflammatory change is seen. No evidence of bowel wall thickening, distention, or inflammatory changes. Surgical anastomosis in the sigmoid colon is identified as on the prior study. Vascular/Lymphatic: Aortic atherosclerosis. No enlarged abdominal or pelvic lymph nodes. Reproductive: Status post hysterectomy. No adnexal masses. Other: There is a small volume of perihepatic ascites. Mesenteric edema is noted. No focal fluid collection is seen. Musculoskeletal: Convex left scoliosis and multilevel degenerative change noted. No acute or focal abnormality. IMPRESSION: Intra and extrahepatic biliary ductal dilatation is new since the prior  examination. Cause for the obstruction is not seen and could be due to a stone which is occult on CT or possibly pancreatic neoplasm. Right upper quadrant ultrasound could be used to check for an occult common bile duct stone. Ill-defined and edematous appearing head of the  pancreas with surrounding stranding is likely due to pancreatitis. Underlying neoplasm is possible. Marked distention of the urinary bladder. Electronically Signed   By: Drusilla Kanner M.D.   On: 04/02/2021 14:55   US Abdomen Complete  Result Date: 04/02/2021 CLINICAL DATA:  Possible biliary obstruction, abdominal pain for 2-3 days. Status post cholecystectomy. EXAM: ABDOMEN ULTRASOUND COMPLETE COMPARISON:  CT abdomen pelvis 04/02/2021 FINDINGS: Gallbladder: Status post cholecystectomy. Common bile duct: Diameter: 15 mm. Liver: No focal lesion identified. Within normal limits in parenchymal echogenicity. Portal vein is patent on color Doppler imaging with normal direction of blood flow towards the liver. IVC: No abnormality visualized. Pancreas: Visualized portion unremarkable. Spleen: Size and appearance within normal limits. Right Kidney: Length: 9.9 cm. Cortical thinning and scarring. Echogenicity within normal limits. No mass or hydronephrosis visualized. Left Kidney: Length: 9.5 cm. There is a 2.3 x 1.6 x 2.1 cm cystic lesion within the left kidney. There is another 4.3 x 3.4 x 3.4 cm cystic lesion. Echogenicity within normal limits. No mass or hydronephrosis visualized. Abdominal aorta: No aneurysm visualized. Other findings: None. IMPRESSION: Common bile duct dilatation in a patient status post cholecystectomy. Given CBD measures up to 15 mm, consider MRCP if clinically indicated. Electronically Signed   By: Tish Frederickson M.D.   On: 04/02/2021 17:15   DG Chest Port 1 View  Result Date: 04/02/2021 CLINICAL DATA:  Chest pain. EXAM: PORTABLE CHEST 1 VIEW COMPARISON:  December 28, 2020. FINDINGS: The heart size and mediastinal  contours are within normal limits. No pneumothorax or pleural effusion is noted. Stable interstitial densities are noted throughout both lungs most consistent with scarring. No acute abnormality is noted. Stable right apical scarring is noted. The visualized skeletal structures are unremarkable. IMPRESSION: No active disease. Aortic Atherosclerosis (ICD10-I70.0). Electronically Signed   By: Lupita Raider M.D.   On: 04/02/2021 11:34   MR ABDOMEN MRCP W WO CONTAST  Result Date: 04/04/2021 CLINICAL DATA:  Jaundice. Evaluate for common bile duct stone. Diabetes. EXAM: MRI ABDOMEN WITHOUT AND WITH CONTRAST (INCLUDING MRCP) TECHNIQUE: Multiplanar multisequence MR imaging of the abdomen was performed both before and after the administration of intravenous contrast. Heavily T2-weighted images of the biliary and pancreatic ducts were obtained, and three-dimensional MRCP images were rendered by post processing. CONTRAST:  36mL GADAVIST GADOBUTROL 1 MMOL/ML IV SOLN COMPARISON:  Ultrasound 04/02/2021.  CT 04/02/2021. FINDINGS: Portions of exam are moderately motion degraded. Lower chest: Moderate bilateral pleural effusions with compressive atelectasis. Mild cardiomegaly. A pleural-based crescentric lesion along the inferior right hemidiaphragm is T1 hyperintense and measures 2.6 cm on 34/7. This is present back to 06/02/2018 and is therefore of doubtful clinical significance. Possibly a chronic hematoma given precontrast signal. Hepatobiliary: Extremely subtle areas of heterogeneous hyperenhancement are identified primarily on arterial phase imaging. Examples in the high left hepatic lobe 1.4 cm on 18/20, hepatic dome on 12/20, and high right hepatic lobe on 20/20. Some of these are subtly apparent on T2 weighted series 7. Cholecystectomy. Moderate intrahepatic biliary duct dilatation, present back to 01/25/2020 CT. The common duct measures up to 1.3 cm in the porta hepatis on 10/15. Tapers relatively gradually to 9 mm  just above the pancreatic head. At and just above the ampulla is a suggestion of somewhat more focal narrowing with possible T2 hypointensity at the ampulla. Example 23/4 and 7/15. Within the posterior mid common duct, 2 mm subtle T2 hypointensity on 06/15 and 6 26/13 is equivocal for tiny nonobstructive stone. Pancreas: Especially when compared to the  CT of 01/25/2020, the pancreas is mildly edematous with peripancreatic edema. No necrosis or pancreatic duct dilatation. No peripancreatic fluid collection. Spleen:  Normal in size, without focal abnormality. Adrenals/Urinary Tract: Normal right adrenal gland. Left adrenal thickening. Bilateral renal cysts. Malrotated right kidney. No hydronephrosis. Stomach/Bowel: Grossly normal abdominal bowel loops and stomach. Vascular/Lymphatic: Aortic atherosclerosis. Patent portal and splenic veins. No abdominal adenopathy. Other:  Perihepatic and perisplenic small volume ascites. Musculoskeletal: Convex left lumbar spine curvature with advanced spondylosis. IMPRESSION: 1. Moderately motion degraded exam. 2. Chronic biliary duct dilatation back to 01/25/2020. Narrowing at and just above the ampulla with suggestion of T2 hypointensity at the ampulla. Cannot exclude ampullary stricture or stone. If this 87 year old patient is a treatment candidate, consider ERCP. 3. Equivocal 2 mm mid common duct dependent stone, not a cause of obstruction. 4. Mild non complicated pancreatitis. 5. Subtle foci of rounded heterogeneous enhancement within the liver,a new compared to 01/25/2020 CT. In this patient with chronic biliary duct dilatation, developing abscesses are a concern. Suboptimally evaluated secondary to motion on the current exam. 6. Bilateral pleural effusions and ascites, suggesting fluid overload. 7.  Aortic Atherosclerosis (ICD10-I70.0). Electronically Signed   By: Jeronimo Greaves M.D.   On: 04/04/2021 12:25

## 2021-04-05 NOTE — Anesthesia Postprocedure Evaluation (Signed)
Anesthesia Post Note  Patient: [name]  Procedure(s) Performed: ENDOSCOPIC RETROGRADE CHOLANGIOPANCREATOGRAPHY (ERCP) (N/A ) SPHINCTEROTOMY     Patient location during evaluation: PACU Anesthesia Type: General Level of consciousness: awake Pain management: pain level controlled Vital Signs Assessment: post-procedure vital signs reviewed and stable Respiratory status: spontaneous breathing Cardiovascular status: stable Postop Assessment: no apparent nausea or vomiting Anesthetic complications: no   No complications documented.  Last Vitals:  Vitals:   04/05/21 1606 04/05/21 1618  BP: 140/64   Pulse: 74 76  Resp: 15   Temp: (!) 36.2 C   SpO2: 94%     Last Pain:  Vitals:   04/05/21 1606  TempSrc: Axillary  PainSc:                  Derold Dorsch

## 2021-04-05 NOTE — Progress Notes (Signed)
[name] 1:45 PM  Subjective: Patient seen and examined and case discussed with the other GI team in her hospital computer chart reviewed currently she is in no acute distress and has no complaints  Objective: No signs stable afebrile exam please see preassessment evaluation count decreased hemoglobin stable platelet count 72 fairly stable potassium recheck okay BUN and creatinine okay LFTs decreased  Assessment: Probable CBD stones on MRI and patient status post cholecystectomy years ago who presents with probable cholangitis  Plan: The son has given consent for the procedure and I rediscussed the procedure with the patient will proceed today with anesthesia assistance  [facility]  office [phone] After 5PM or if no answer call [phone]

## 2021-04-05 NOTE — Progress Notes (Signed)
Patient ID: [name], female   DOB: 01-01-1925, 87 y.o.   MRN: [id number]    Brief GI -patient scheduled for ERCP with stone extraction with Dr. Ewing Schlein  Today I spoke with patient's son this morning by phone, Ray [phone]  ERCP was discussed in detail, including indications risks and benefits, and he verbally consents.  Questions were answered.  He was asked to call the patient's floor to speak with nurse and complete consent.Marland Kitchen

## 2021-04-05 NOTE — Op Note (Signed)
[facility] Patient Name: [name] Procedure Date : 04/05/2021 MRN: [id number] Attending MD: Vida Rigger , MD Date of Birth: September 22, 1925 CSN: [id number] Age: 87 Admit Type: Inpatient Procedure:                ERCP Indications:              Bile duct stone(s) on MRCP, Suspected ascending                            cholangitis and resolved pancreatitis Providers:                Vida Rigger, MD, Clearnce Sorrel, RN, Shelda Jakes, RN, Leanne Lovely, Technician,                            Everlene Balls, CRNA Referring MD:              Medicines:                General Anesthesia Complications:            No immediate complications. Estimated Blood Loss:     Estimated blood loss was minimal. Procedure:                Pre-Anesthesia Assessment:                           - Prior to the procedure, a History and Physical                            was performed, and patient medications and                            allergies were reviewed. The patient's tolerance of                            previous anesthesia was also reviewed. The risks                            and benefits of the procedure and the sedation                            options and risks were discussed with the patient.                            All questions were answered, and informed consent                            was obtained. Prior Anticoagulants: The patient has                            taken no previous anticoagulant or antiplatelet                            agents. ASA Grade  Assessment: III - A patient with                            severe systemic disease. After reviewing the risks                            and benefits, the patient was deemed in                            satisfactory condition to undergo the procedure.                           After obtaining informed consent, the scope was                            passed under direct vision.  Throughout the                            procedure, the patient's blood pressure, pulse, and                            oxygen saturations were monitored continuously. The                            TJF- Q180V ([id number]) Olympus duodenoscope was                            introduced through the mouth, and used to inject                            contrast into and used to cannulate the bile duct.                            The ERCP was accomplished without difficulty. The                            patient tolerated the procedure well. Scope In: Scope Out: Findings:      The major papilla was normal. Deep selective cannulation was readily       obtained and there was no pancreatic duct injection or wire advancement       throughout the procedure and we proceeded with a biliary sphincterotomy       was made with a Hydratome sphincterotome using ERBE electrocautery. We       proceeded until we had adequate biliary drainage and could get the fully       bowed sphincterotome easily in and out of the duct and there was self       limited oozing from the sphincterotomy which did not require treatment       however at the end of the procedure we did blow up the balloon in the       lumen and compress the sphincterotomy site for 1 minute to assist with       the oozing to stop. To discover objects, the biliary tree was swept with       an adjustable 12-15  mm balloon starting at the bifurcation. We used a 12       mm balloon which was pulled through the patent sphincterotomy site       multiple times which did stir up the bleeding a little bit and the       balloon did have to be lowered a little bit to pull through the patent       sphincterotomy site but nothing was found i.e. no stones sludge or signs       of cholangitis. An occlusion cholangiogram was done in the customary       fashion which did not reveal any additional filling defects or       abnormalities and at the end of the procedure  the sphincterotomy site       had no signs of bleeding and was washed and watched multiple times and       there was adequate biliary drainage and we elected to stop the procedure       at this point and the patient tolerated the procedure well Impression:               - The major papilla appeared normal.                           - A biliary sphincterotomy was performed.                           - The biliary tree was swept and nothing was found. Recommendation:           - Clear liquid diet today.                           - Continue present medications.                           - Return to GI clinic PRN.                           - Telephone GI clinic if symptomatic PRN.                           - Check liver enzymes (AST, ALT, alkaline                            phosphatase, bilirubin) and hemogram with white                            blood cell count and platelets in the morning. Procedure Code(s):        --- Professional ---                           [id number], Endoscopic retrograde                            cholangiopancreatography (ERCP); with                            sphincterotomy/papillotomy Diagnosis Code(s):        --- Professional ---  K80.50, Calculus of bile duct without cholangitis                            or cholecystitis without obstruction CPT copyright 2019 [employer]. All rights reserved. The codes documented in this report are preliminary and upon coder review may  be revised to meet current compliance requirements. Vida Rigger, MD 04/05/2021 3:01:26 PM This report has been signed electronically. Number of Addenda: 0

## 2021-04-05 NOTE — Anesthesia Preprocedure Evaluation (Addendum)
Anesthesia Evaluation  Patient identified by MRN, date of birth, ID band Patient awake    Reviewed: Allergy & Precautions, NPO status , Patient's Chart, lab work & pertinent test results  Airway Mallampati: II  TM Distance: >3 FB     Dental   Pulmonary COPD, former smoker,    breath sounds clear to auscultation       Cardiovascular +CHF   Rhythm:Regular Rate:Normal     Neuro/Psych PSYCHIATRIC DISORDERS Depression    GI/Hepatic Neg liver ROS, GERD  ,  Endo/Other  diabetesHypothyroidism   Renal/GU      Musculoskeletal   Abdominal   Peds  Hematology   Anesthesia Other Findings   Reproductive/Obstetrics                             Anesthesia Physical Anesthesia Plan  ASA: III  Anesthesia Plan: General   Post-op Pain Management:    Induction: Intravenous  PONV Risk Score and Plan: 3 and Ondansetron, Dexamethasone and Midazolam  Airway Management Planned: Oral ETT  Additional Equipment:   Intra-op Plan:   Post-operative Plan: Extubation in OR  Informed Consent: I have reviewed the patients History and Physical, chart, labs and discussed the procedure including the risks, benefits and alternatives for the proposed anesthesia with the patient or authorized representative who has indicated his/her understanding and acceptance.    Discussed DNR with power of attorney and Suspend DNR.   Dental advisory given  Plan Discussed with: CRNA and Anesthesiologist  Anesthesia Plan Comments:        Anesthesia Quick Evaluation

## 2021-04-05 NOTE — Anesthesia Procedure Notes (Signed)
Procedure Name: Intubation Date/Time: 04/05/2021 2:11 PM Performed by: Betha Loa, CRNA Pre-anesthesia Checklist: Patient identified, Emergency Drugs available, Suction available and Patient being monitored Patient Re-evaluated:Patient Re-evaluated prior to induction Oxygen Delivery Method: Circle System Utilized Preoxygenation: Pre-oxygenation with 100% oxygen Induction Type: IV induction Ventilation: Mask ventilation without difficulty Laryngoscope Size: Mac and 3 Grade View: Grade I Tube type: Oral Tube size: 7.0 mm Number of attempts: 1 Airway Equipment and Method: Stylet and Oral airway Placement Confirmation: ETT inserted through vocal cords under direct vision,  positive ETCO2 and breath sounds checked- equal and bilateral Secured at: 21 cm Tube secured with: Tape Dental Injury: Teeth and Oropharynx as per pre-operative assessment

## 2021-04-05 NOTE — Transfer of Care (Signed)
Immediate Anesthesia Transfer of Care Note  Patient: [name]  Procedure(s) Performed: ENDOSCOPIC RETROGRADE CHOLANGIOPANCREATOGRAPHY (ERCP) (N/A ) SPHINCTEROTOMY  Patient Location: PACU  Anesthesia Type:General  Level of Consciousness: awake, patient cooperative and responds to stimulation  Airway & Oxygen Therapy: Patient Spontanous Breathing and Patient connected to nasal cannula oxygen  Post-op Assessment: Report given to RN and Post -op Vital signs reviewed and stable  Post vital signs: Reviewed and stable  Last Vitals:  Vitals Value Taken Time  BP    Temp    Pulse    Resp    SpO2      Last Pain:  Vitals:   04/05/21 1345  TempSrc: Temporal  PainSc: 0-No pain      Patients Stated Pain Goal: 0 (04/04/21 0000)  Complications: No complications documented.

## 2021-04-05 NOTE — Progress Notes (Signed)
Pharmacy Antibiotic Note  [name] is a 87 y.o. female admitted on 04/02/2021 with intra-abdominal infection of ascending cholangitis and noncomplicated pancreatitis. She is now s/o MRCP and will undergo ERCP likely today.  Pharmacy has been consulted for Zosyn dosing. This is day 4 of therapy -- patient is afebrile with WBC downtrended to 10.9.  Scr 0.47 with Crcl of 27.43ml/min, 39.8kg total body weight  Plan: Continue with Zosyn 3.375g IV q8h   Monitor labs, c/s, renal function, and patient improvement. F/u length of therapy   Height: 5\' 2"  (157.5 cm) Weight: 39.8 kg (87 lb 11.9 oz) IBW/kg (Calculated) : 50.1  Temp (24hrs), Avg:97.4 F (36.3 C), Min:96.7 F (35.9 C), Max:98.4 F (36.9 C)  Recent Labs  Lab 04/02/21 1218 04/02/21 1804 04/03/21 0515 04/03/21 0807 04/04/21 0100 04/05/21 0539  WBC 4.4  --  21.1*  --  12.9* 10.9*  CREATININE 0.63  --  0.54  --  0.48 0.47  LATICACIDVEN  --  4.1*  --  1.6  --   --     Estimated Creatinine Clearance: 27.6 mL/min (by C-G formula based on SCr of 0.47 mg/dL).    Allergies  Allergen Reactions  . Sulfonamide Derivatives Anaphylaxis, Shortness Of Breath and Swelling  . Aspirin     Uncoated Aspirin = stomach upset/pain EC Aspirin is ok    Antimicrobials this admission: Zosyn 4/14  >>     Microbiology results: 4/14 BCx: no growth 3 days   Thank you for allowing pharmacy to be a part of this patient's care. 5/14, PharmD PGY1 Pharmacy Resident 04/05/2021 8:52 AM

## 2021-04-06 DIAGNOSIS — R451 Restlessness and agitation: Secondary | ICD-10-CM

## 2021-04-06 DIAGNOSIS — Z66 Do not resuscitate: Secondary | ICD-10-CM

## 2021-04-06 DIAGNOSIS — Z7189 Other specified counseling: Secondary | ICD-10-CM

## 2021-04-06 DIAGNOSIS — R109 Unspecified abdominal pain: Secondary | ICD-10-CM

## 2021-04-06 DIAGNOSIS — Z515 Encounter for palliative care: Secondary | ICD-10-CM

## 2021-04-06 DIAGNOSIS — R7989 Other specified abnormal findings of blood chemistry: Secondary | ICD-10-CM

## 2021-04-06 DIAGNOSIS — Z9889 Other specified postprocedural states: Secondary | ICD-10-CM

## 2021-04-06 DIAGNOSIS — Z789 Other specified health status: Secondary | ICD-10-CM

## 2021-04-06 DIAGNOSIS — R1010 Upper abdominal pain, unspecified: Secondary | ICD-10-CM

## 2021-04-06 LAB — COMPREHENSIVE METABOLIC PANEL
ALT: 71 U/L — ABNORMAL HIGH (ref 0–44)
AST: 43 U/L — ABNORMAL HIGH (ref 15–41)
Albumin: 1.7 g/dL — ABNORMAL LOW (ref 3.5–5.0)
Alkaline Phosphatase: 252 U/L — ABNORMAL HIGH (ref 38–126)
Anion gap: 6 (ref 5–15)
BUN: 12 mg/dL (ref 8–23)
CO2: 22 mmol/L (ref 22–32)
Calcium: 7.7 mg/dL — ABNORMAL LOW (ref 8.9–10.3)
Chloride: 104 mmol/L (ref 98–111)
Creatinine, Ser: 0.56 mg/dL (ref 0.44–1.00)
GFR, Estimated: >60 mL/min (ref >60)
Glucose, Bld: 193 mg/dL — ABNORMAL HIGH (ref 70–99)
Potassium: 4.7 mmol/L (ref 3.5–5.1)
Sodium: 132 mmol/L — ABNORMAL LOW (ref 135–145)
Total Bilirubin: 1 mg/dL (ref 0.3–1.2)
Total Protein: 4.5 g/dL — ABNORMAL LOW (ref 6.5–8.1)

## 2021-04-06 LAB — CBC
HCT: 27.3 % — ABNORMAL LOW (ref 36.0–46.0)
Hemoglobin: 9.3 g/dL — ABNORMAL LOW (ref 12.0–15.0)
MCH: 35 pg — ABNORMAL HIGH (ref 26.0–34.0)
MCHC: 34.1 g/dL (ref 30.0–36.0)
MCV: 102.6 fL — ABNORMAL HIGH (ref 80.0–100.0)
Platelets: 68 10*3/uL — ABNORMAL LOW (ref 150–400)
RBC: 2.66 MIL/uL — ABNORMAL LOW (ref 3.87–5.11)
RDW: 13.3 % (ref 11.5–15.5)
WBC: 3.1 10*3/uL — ABNORMAL LOW (ref 4.0–10.5)
nRBC: 0 % (ref 0.0–0.2)

## 2021-04-06 LAB — HEMOGLOBIN A1C
Hgb A1c MFr Bld: 5 % (ref 4.8–5.6)
Mean Plasma Glucose: 97 mg/dL

## 2021-04-06 LAB — POTASSIUM: Potassium: 4.8 mmol/L (ref 3.5–5.1)

## 2021-04-06 LAB — BRAIN NATRIURETIC PEPTIDE: B Natriuretic Peptide: 1125 pg/mL — ABNORMAL HIGH (ref 0.0–100.0)

## 2021-04-06 LAB — GLUCOSE, CAPILLARY: Glucose-Capillary: 85 mg/dL (ref 70–99)

## 2021-04-06 LAB — PROCALCITONIN: Procalcitonin: 1.81 ng/mL

## 2021-04-06 LAB — MAGNESIUM: Magnesium: 1.5 mg/dL — ABNORMAL LOW (ref 1.7–2.4)

## 2021-04-06 MED ORDER — HALOPERIDOL LACTATE 5 MG/ML IJ SOLN: 2.0000 mg | Freq: Four times a day (QID) | INTRAMUSCULAR | Status: DC | PRN

## 2021-04-06 MED ORDER — POLYVINYL ALCOHOL 1.4 % OP SOLN
1.0000 [drp] | Freq: Four times a day (QID) | OPHTHALMIC | Status: DC | PRN
  Filled 2021-04-06: qty 15

## 2021-04-06 MED ORDER — ACETAMINOPHEN 325 MG PO TABS: 650.0000 mg | ORAL_TABLET | Freq: Four times a day (QID) | ORAL | Status: DC | PRN

## 2021-04-06 MED ORDER — HYDROCODONE-ACETAMINOPHEN 5-325 MG PO TABS
1.0000 | ORAL_TABLET | Freq: Four times a day (QID) | ORAL | Status: DC | PRN
  Administered 2021-04-07: 1 via ORAL
  Filled 2021-04-06: qty 1

## 2021-04-06 MED ORDER — LORAZEPAM 2 MG/ML PO CONC: 1.0000 mg | ORAL | Status: DC | PRN

## 2021-04-06 MED ORDER — LORAZEPAM 1 MG PO TABS: 1.0000 mg | ORAL_TABLET | ORAL | Status: DC | PRN

## 2021-04-06 MED ORDER — MORPHINE SULFATE (PF) 2 MG/ML IV SOLN
2.0000 mg | INTRAVENOUS | Status: DC | PRN
  Administered 2021-04-06: 2 mg via INTRAVENOUS
  Filled 2021-04-06: qty 1

## 2021-04-06 MED ORDER — ONDANSETRON HCL 4 MG/2ML IJ SOLN: 4.0000 mg | Freq: Four times a day (QID) | INTRAMUSCULAR | Status: DC | PRN

## 2021-04-06 MED ORDER — ONDANSETRON 4 MG PO TBDP: 4.0000 mg | ORAL_TABLET | Freq: Four times a day (QID) | ORAL | Status: DC | PRN

## 2021-04-06 MED ORDER — LORAZEPAM 2 MG/ML IJ SOLN: 1.0000 mg | INTRAMUSCULAR | Status: DC | PRN

## 2021-04-06 MED ORDER — GLYCOPYRROLATE 0.2 MG/ML IJ SOLN: 0.2000 mg | INTRAMUSCULAR | Status: DC | PRN

## 2021-04-06 MED ORDER — HYDROCODONE-ACETAMINOPHEN 5-325 MG PO TABS: 1.0000 | ORAL_TABLET | Freq: Four times a day (QID) | ORAL | Status: DC | PRN

## 2021-04-06 MED ORDER — HALOPERIDOL LACTATE 2 MG/ML PO CONC
2.0000 mg | Freq: Four times a day (QID) | ORAL | Status: DC | PRN
  Filled 2021-04-06: qty 1

## 2021-04-06 MED ORDER — ACETAMINOPHEN 650 MG RE SUPP: 650.0000 mg | Freq: Four times a day (QID) | RECTAL | Status: DC | PRN

## 2021-04-06 MED ORDER — ACETAMINOPHEN 325 MG PO TABS
650.0000 mg | ORAL_TABLET | Freq: Four times a day (QID) | ORAL | Status: DC | PRN
  Administered 2021-04-07: 650 mg via ORAL
  Filled 2021-04-06: qty 2

## 2021-04-06 MED ORDER — TAMSULOSIN HCL 0.4 MG PO CAPS
0.4000 mg | ORAL_CAPSULE | Freq: Every day | ORAL | Status: DC
  Administered 2021-04-06: 0.4 mg via ORAL
  Filled 2021-04-06: qty 1

## 2021-04-06 MED ORDER — BIOTENE DRY MOUTH MT LIQD
15.0000 mL | Freq: Two times a day (BID) | OROMUCOSAL | Status: DC
  Administered 2021-04-07: 15 mL via TOPICAL

## 2021-04-06 MED ORDER — HALOPERIDOL LACTATE 5 MG/ML IJ SOLN
2.0000 mg | Freq: Four times a day (QID) | INTRAMUSCULAR | Status: DC | PRN
  Administered 2021-04-06: 2 mg via INTRAVENOUS
  Filled 2021-04-06: qty 1

## 2021-04-06 MED ORDER — DIVALPROEX SODIUM 125 MG PO DR TAB
125.0000 mg | DELAYED_RELEASE_TABLET | Freq: Three times a day (TID) | ORAL | Status: DC
  Administered 2021-04-06 – 2021-04-07 (×3): 125 mg via ORAL
  Filled 2021-04-06 (×5): qty 1

## 2021-04-06 MED ORDER — KCL IN DEXTROSE-NACL 40-5-0.9 MEQ/L-%-% IV SOLN
INTRAVENOUS | Status: DC
  Filled 2021-04-06: qty 1000

## 2021-04-06 MED ORDER — GLYCOPYRROLATE 1 MG PO TABS: 1.0000 mg | ORAL_TABLET | ORAL | Status: DC | PRN

## 2021-04-06 MED ORDER — MAGNESIUM SULFATE 2 GM/50ML IV SOLN
2.0000 g | Freq: Once | INTRAVENOUS | Status: AC
  Administered 2021-04-06: 2 g via INTRAVENOUS
  Filled 2021-04-06: qty 50

## 2021-04-06 MED ORDER — MIRTAZAPINE 15 MG PO TABS
7.5000 mg | ORAL_TABLET | Freq: Every day | ORAL | Status: DC
  Administered 2021-04-06: 7.5 mg via ORAL
  Filled 2021-04-06: qty 1

## 2021-04-06 MED ORDER — HALOPERIDOL 1 MG PO TABS
2.0000 mg | ORAL_TABLET | Freq: Four times a day (QID) | ORAL | Status: DC | PRN
  Administered 2021-04-07: 2 mg via ORAL
  Filled 2021-04-06: qty 2

## 2021-04-06 MED ORDER — VALPROATE SODIUM 100 MG/ML IV SOLN
125.0000 mg | Freq: Three times a day (TID) | INTRAVENOUS | Status: DC
  Filled 2021-04-06 (×6): qty 1.25

## 2021-04-06 NOTE — Progress Notes (Addendum)
PROGRESS NOTE                                                                                                                                                                                                             Patient Demographics:    [name], is a 87 y.o. female, DOB - 1925/10/15, ZOX:[id number]  Outpatient Primary MD for the patient is Toma Deiters, MD    LOS - 4  Admit date - 04/02/2021    Chief Complaint  Patient presents with  . Abdominal Pain       Brief Narrative (HPI from H&P) - 87 year old nursing home resident admitted with abdominal pain .  She reports black stools for "1 year" and just feeling bad.  Her work-up was consistent with possible CBD obstruction along with some pancreatic inflammation, she was admitted by critical care seen by GI and transferred to hospitalist service on 04/05/2021 more day 3 of her hospital stay.   Subjective:    [name] today has, No headache, No chest pain, No abdominal pain - No Nausea, No new weakness tingling or numbness, no SOB.   Assessment  & Plan :     1.  Sepsis due to acute cholangitis & pancreatitis   with  CBD obstruction.  She has been treated and stabilized in ICU, continue Zosyn along with gentle IV fluids.  Sepsis pathophysiology has resolved.  MRCP noted.  She was seen by [facility] and underwent ERCP on 04/06/2019 with CBD sphincterectomy and obstruction removal, LFTs and lipase much improved, tolerating liquid diet will advance to soft and monitor.  2.  Thrombocytopenia due to sepsis.  Gradually improving.  Monitor no signs of bleeding.  Lab Results  Component Value Date   PLT 68 (L) 04/06/2021    3.  Chronic back pain.  Supportive care, avoiding narcotics as she developed some encephalopathy.  4.  Hypertension.  Add low-dose Coreg and monitor.  5.  GERD.  On Pepcid.  6.  Hypothyroidism.  Continue Synthroid at home dose.  7.   Severe hypokalemia and hypomagnesemia.  Replaced.  8.  Metabolic encephalopathy night of 04/05/2021.  Minimize narcotics and benzodiazepines, use Haldol if needed.   9.  Urinary retention.  Had Foley placed on 04/04/2021.  Add Flomax.  Will give a Foley removal trial in the next 1  to 2 days.    10. DM type II.  Sliding scale for now.  Lab Results  Component Value Date   HGBA1C 6.3 (H) 10/08/2017   CBG (last 3)  Recent Labs    04/04/21 2330 04/05/21 1409 04/05/21 1633  GLUCAP 113* 85 95         Condition -  Guarded  Family Communication  :  Ray [phone] on 04/05/2021 at 8:53 AM, over 10 rings no response, called 04/06/21 over 10 rings no response  Code Status :  DNR  Consults  :  PCCM, GI  PUD Prophylaxis : Pepcid   Procedures  :     ERCP - Dilatation of the common bile duct with potential component of distal CBD stricture. These images were submitted for radiologic interpretation only. Please see the procedural report for the amount of contrast and the fluoroscopy time utilized   MRCP - 1. Moderately motion degraded exam. 2. Chronic biliary duct dilatation back to 01/25/2020. Narrowing at and just above the ampulla with suggestion of T2 hypointensity at the ampulla. Cannot exclude ampullary stricture or stone. If this 87 year old patient is a treatment candidate, consider ERCP. 3. Equivocal 2 mm mid common duct dependent stone, not a cause of obstruction. 4. Mild non complicated pancreatitis. 5. Subtle foci of rounded heterogeneous enhancement within the liver,a new compared to 01/25/2020 CT. In this patient with chronic biliary duct dilatation, developing abscesses are a concern. Suboptimally evaluated secondary to motion on the current exam. 6. Bilateral pleural effusions and ascites, suggesting fluid overload. 7.  Aortic Atherosclerosis      Disposition Plan  :    Status is: Inpatient  Remains inpatient appropriate because:IV treatments appropriate due to intensity of  illness or inability to take PO   Dispo: The patient is from: Home              Anticipated d/c is to: Home              Patient currently is not medically stable to d/c.   Difficult to place patient No   DVT Prophylaxis  :    Heparin    Lab Results  Component Value Date   PLT 68 (L) 04/06/2021    Diet :  Diet Order            DIET SOFT Room service appropriate? Yes; Fluid consistency: Thin  Diet effective now                  Inpatient Medications  Scheduled Meds: . carvedilol  3.125 mg Oral BID WC  . Chlorhexidine Gluconate Cloth  6 each Topical Daily  . famotidine  20 mg Oral Daily  . heparin injection (subcutaneous)  5,000 Units Subcutaneous Q8H  . levothyroxine  100 mcg Oral QAC breakfast  . sodium chloride flush  3 mL Intravenous Q12H   Continuous Infusions: . dextrose 5 % and 0.9 % NaCl with KCl 40 mEq/L    . magnesium sulfate bolus IVPB    . piperacillin-tazobactam (ZOSYN)  IV 3.375 g (04/06/21 0913)  . valproate sodium     PRN Meds:.haloperidol lactate, nitroGLYCERIN, [DISCONTINUED] ondansetron **OR** ondansetron (ZOFRAN) IV  Antibiotics  :    Anti-infectives (From admission, onward)   Start     Dose/Rate Route Frequency Ordered Stop   04/03/21 0000  piperacillin-tazobactam (ZOSYN) IVPB 3.375 g        3.375 g 12.5 mL/hr over 240 Minutes Intravenous Every 8 hours 04/02/21 1618  04/02/21 1600  piperacillin-tazobactam (ZOSYN) IVPB 3.375 g        3.375 g 100 mL/hr over 30 Minutes Intravenous  Once 04/02/21 1553 04/02/21 1647       Time Spent in minutes  30   Susa Raring M.D on 04/06/2021 at 11:08 AM  To page go to [email or web address]   [facility] -  Office  [phone]   See all Orders from today for further details    Objective:   Vitals:   04/06/21 0338 04/06/21 0400 04/06/21 0500 04/06/21 0600  BP: (!) 111/49     Pulse:  60 64 64  Resp:  Temp: 97.7 F (36.5 C)     TempSrc: Oral     SpO2: 100% 99% 99% 96%   Weight:      Height:        Wt Readings from Last 3 Encounters:  04/02/21 39.8 kg  12/31/19 57.6 kg  01/19/19 58.4 kg     Intake/Output Summary (Last 24 hours) at 04/06/2021 1108 Last data filed at 04/06/2021 0600 Gross per 24 hour  Intake 1923.47 ml  Output 1950 ml  Net -26.53 ml     Physical Exam  Awake but mildly confused, No new F.N deficits,   [place].AT,PERRAL Supple Neck,No JVD, No cervical lymphadenopathy appriciated.  Symmetrical Chest wall movement, Good air movement bilaterally, CTAB RRR,No Gallops, Rubs or new Murmurs, No Parasternal Heave +ve B.Sounds, Abd Soft, No tenderness, No organomegaly appriciated, No rebound - guarding or rigidity. No Cyanosis, Clubbing or edema, No new Rash or bruise    Data Review:    CBC Recent Labs  Lab 04/02/21 1218 04/03/21 0515 04/04/21 0100 04/05/21 0539 04/05/21 1328 04/06/21 0331  WBC 4.4 21.1* 12.9* 10.9*  --  3.1*  HGB 11.8* 10.4* 9.2* 10.9* 12.2 9.3*  HCT 36.9 32.8* 28.8* 31.5* 36.0 27.3*  PLT 89* 86* 73* 72*  --  68*  MCV 109.5* 109.3* 107.1* 101.0*  --  102.6*  MCH 35.0* 34.7* 34.2* 34.9*  --  35.0*  MCHC 32.0 31.7 31.9 34.6  --  34.1  RDW 13.3 13.4 13.6 13.3  --  13.3  LYMPHSABS 0.2*  --   --   --   --   --   MONOABS 0.0*  --   --   --   --   --   EOSABS 0.0  --   --   --   --   --   BASOSABS 0.0  --   --   --   --   --     Recent Labs  Lab 04/02/21 1218 04/02/21 1804 04/03/21 0515 04/03/21 0807 04/03/21 1410 04/04/21 0025 04/04/21 0100 04/04/21 1454 04/05/21 0539 04/05/21 0641 04/05/21 0740 04/05/21 1328 04/05/21 1738 04/05/21 2359 04/06/21 0331  NA 136  --  140  --   --   --  134*  --  135  --   --  138  --   --  132*  K 4.0  --  4.4  --   --   --  3.0*  --  2.4*  --   --  3.7 5.0 4.8 4.7  CL 100  --  108  --   --   --  107  --  103  --   --  100  --   --  104  CO2 21*  --  23  --   --   --  18*  --  26  --   --   --   --   --  22  GLUCOSE 172*  --  78  --   --   --  90  --  81  --   --   60*  --   --  193*  BUN 27*  --  26*  --   --   --  14  --  9  --   --  9  --   --  12  CREATININE 0.63  --  0.54  --   --   --  0.48  --  0.47  --   --  0.30*  --   --  0.56  CALCIUM 8.6*  --  7.9*  --   --   --  7.9*  --  8.1*  --   --   --   --   --  7.7*  AST 897*  --  385*  --   --   --  92*  --  52*  --   --   --   --   --  43*  ALT 434*  --  270*  --   --   --  117*  --  100*  --   --   --   --   --  71*  ALKPHOS 249*  --  159*  --   --   --  126  --  108  --   --   --   --   --  252*  BILITOT 2.4*  --  1.3*  --   --   --  0.9  --  0.6  --   --   --   --   --  1.0  ALBUMIN 3.1*  --  2.4*  --   --   --  1.7*  --  1.9*  --   --   --   --   --  1.7*  MG  --   --   --   --   --   --   --   --   --  1.5*  --   --   --   --  1.5*  CRP  --   --   --   --  16.4*  --   --   --   --   --   --   --   --   --   --   PROCALCITON 5.57  --  7.63  --   --   --   --   --   --   --  2.38  --   --   --  1.81  LATICACIDVEN  --  4.1*  --  1.6  --   --   --   --   --   --   --   --   --   --   --   INR  --   --  1.6*  --   --   --   --  1.3*  --   --  1.1  --   --   --   --   AMMONIA  --   --   --   --   --  47*  --   --   --   --   --   --   --   --   --  BNP  --   --   --   --   --   --   --   --   --   --  407.3*  --   --   --  1,125.0*    ------------------------------------------------------------------------------------------------------------------ No results for input(s): CHOL, HDL, LDLCALC, TRIG, CHOLHDL, LDLDIRECT in the last 72 hours.  Lab Results  Component Value Date   HGBA1C 6.3 (H) 10/08/2017   ------------------------------------------------------------------------------------------------------------------ No results for input(s): TSH, T4TOTAL, T3FREE, THYROIDAB in the last 72 hours.  Invalid input(s): FREET3  Cardiac Enzymes No results for input(s): CKMB, TROPONINI, MYOGLOBIN in the last 168 hours.  Invalid input(s):  CK ------------------------------------------------------------------------------------------------------------------    Component Value Date/Time   BNP 1,125.0 (H) 04/06/2021 0331    Micro Results Recent Results (from the past 240 hour(s))  Culture, blood (routine x 2)     Status: None (Preliminary result)   Collection Time: 04/02/21  6:04 PM   Specimen: BLOOD LEFT HAND  Result Value Ref Range Status   Specimen Description BLOOD LEFT HAND  Final   Special Requests   Final    BOTTLES DRAWN AEROBIC ONLY Blood Culture results may not be optimal due to an inadequate volume of blood received in culture bottles   Culture   Final    NO GROWTH 4 DAYS Performed at [facility], [address], [place], [place] [address]    Report Status PENDING  Incomplete  MRSA PCR Screening     Status: None   Collection Time: 04/02/21  6:48 PM   Specimen: Nasal Mucosa; Nasopharyngeal  Result Value Ref Range Status   MRSA by PCR NEGATIVE NEGATIVE Final    Comment:        The GeneXpert MRSA Assay (FDA approved for NASAL specimens only), is one component of a comprehensive MRSA colonization surveillance program. It is not intended to diagnose MRSA infection nor to guide or monitor treatment for MRSA infections. Performed at [facility], [address], [place], [place] [address]   Culture, blood (routine x 2)     Status: None (Preliminary result)   Collection Time: 04/02/21  8:12 PM   Specimen: BLOOD LEFT HAND  Result Value Ref Range Status   Specimen Description   Final    BLOOD LEFT HAND Performed at [facility], [address] [place], [place] [address]    Special Requests   Final    BOTTLES DRAWN AEROBIC ONLY Blood Culture adequate volume   Culture   Final    NO GROWTH 4 DAYS Performed at [facility], [address], [place], [place] [address]    Report Status PENDING  Incomplete  SARS CORONAVIRUS 2 (TAT 6-24 HRS) Nasopharyngeal Nasopharyngeal Swab     Status: None    Collection Time: 04/04/21  5:08 PM   Specimen: Nasopharyngeal Swab  Result Value Ref Range Status   SARS Coronavirus 2 NEGATIVE NEGATIVE Final    Comment: (NOTE) SARS-CoV-2 target nucleic acids are NOT DETECTED.  The SARS-CoV-2 RNA is generally detectable in upper and lower respiratory specimens during the acute phase of infection. Negative results do not preclude SARS-CoV-2 infection, do not rule out co-infections with other pathogens, and should not be used as the sole basis for treatment or other patient management decisions. Negative results must be combined with clinical observations, patient history, and epidemiological information. The expected result is Negative.  Fact Sheet for Patients: HairSlick.no  Fact Sheet for Healthcare Providers: quierodirigir.com  This test is not yet approved or  cleared by the [place] and  has been authorized for detection and/or diagnosis of SARS-CoV-2 by FDA under an Emergency Use Authorization (EUA). This EUA will remain  in effect (meaning this test can be used) for the duration of the COVID-19 declaration under Se ction 564(b)(1) of the Act, 21 U.S.C. section 360bbb-3(b)(1), unless the authorization is terminated or revoked sooner.  Performed at [facility], [address] [place], [place] [address]     Radiology Reports CT ABDOMEN PELVIS WO CONTRAST  Result Date: 04/02/2021 CLINICAL DATA:  Left lower quadrant pain for 2-3 days. EXAM: CT ABDOMEN AND PELVIS WITHOUT CONTRAST TECHNIQUE: Multidetector CT imaging of the abdomen and pelvis was performed following the standard protocol without IV contrast. COMPARISON:  CT angiogram of the chest, abdomen and pelvis 10/09/2017. FINDINGS: Lower chest: Mild cardiomegaly. Small pleural effusions, greater on the left. Dependent atelectasis. Hepatobiliary: No focal liver lesion is identified on this unenhanced exam. Intra and  extrahepatic biliary ductal dilatation appears worse than on the prior CT. The gallbladder has been removed. Pancreas: The body and tail the pancreas are markedly atrophic. The head and neck of the pancreas appear swollen with surrounding stranding. Spleen: Normal in size without focal abnormality. Adrenals/Urinary Tract: Abnormal axis of orientation of the right kidney is unchanged. 2-3 cysts are seen in the lower pole the left kidney, also unchanged. The kidneys are otherwise unremarkable. The bladder is markedly distended. No urinary tract stones are seen. Adrenal glands are negative. Stomach/Bowel: Stomach is within normal limits. The appendix is not visualized but no pericecal inflammatory change is seen. No evidence of bowel wall thickening, distention, or inflammatory changes. Surgical anastomosis in the sigmoid colon is identified as on the prior study. Vascular/Lymphatic: Aortic atherosclerosis. No enlarged abdominal or pelvic lymph nodes. Reproductive: Status post hysterectomy. No adnexal masses. Other: There is a small volume of perihepatic ascites. Mesenteric edema is noted. No focal fluid collection is seen. Musculoskeletal: Convex left scoliosis and multilevel degenerative change noted. No acute or focal abnormality. IMPRESSION: Intra and extrahepatic biliary ductal dilatation is new since the prior examination. Cause for the obstruction is not seen and could be due to a stone which is occult on CT or possibly pancreatic neoplasm. Right upper quadrant ultrasound could be used to check for an occult common bile duct stone. Ill-defined and edematous appearing head of the pancreas with surrounding stranding is likely due to pancreatitis. Underlying neoplasm is possible. Marked distention of the urinary bladder. Electronically Signed   By: Drusilla Kanner M.D.   On: 04/02/2021 14:55   US Abdomen Complete  Result Date: 04/02/2021 CLINICAL DATA:  Possible biliary obstruction, abdominal pain for 2-3  days. Status post cholecystectomy. EXAM: ABDOMEN ULTRASOUND COMPLETE COMPARISON:  CT abdomen pelvis 04/02/2021 FINDINGS: Gallbladder: Status post cholecystectomy. Common bile duct: Diameter: 15 mm. Liver: No focal lesion identified. Within normal limits in parenchymal echogenicity. Portal vein is patent on color Doppler imaging with normal direction of blood flow towards the liver. IVC: No abnormality visualized. Pancreas: Visualized portion unremarkable. Spleen: Size and appearance within normal limits. Right Kidney: Length: 9.9 cm. Cortical thinning and scarring. Echogenicity within normal limits. No mass or hydronephrosis visualized. Left Kidney: Length: 9.5 cm. There is a 2.3 x 1.6 x 2.1 cm cystic lesion within the left kidney. There is another 4.3 x 3.4 x 3.4 cm cystic lesion. Echogenicity within normal limits. No mass or hydronephrosis visualized. Abdominal aorta: No aneurysm visualized. Other findings: None. IMPRESSION: Common bile duct dilatation in  a patient status post cholecystectomy. Given CBD measures up to 15 mm, consider MRCP if clinically indicated. Electronically Signed   By: Tish Frederickson M.D.   On: 04/02/2021 17:15   DG Chest Port 1 View  Result Date: 04/02/2021 CLINICAL DATA:  Chest pain. EXAM: PORTABLE CHEST 1 VIEW COMPARISON:  December 28, 2020. FINDINGS: The heart size and mediastinal contours are within normal limits. No pneumothorax or pleural effusion is noted. Stable interstitial densities are noted throughout both lungs most consistent with scarring. No acute abnormality is noted. Stable right apical scarring is noted. The visualized skeletal structures are unremarkable. IMPRESSION: No active disease. Aortic Atherosclerosis (ICD10-I70.0). Electronically Signed   By: Lupita Raider M.D.   On: 04/02/2021 11:34   DG ERCP  Result Date: 04/06/2021 CLINICAL DATA:  Biliary ductal dilatation. EXAM: ERCP TECHNIQUE: Multiple spot images obtained with the fluoroscopic device and submitted  for interpretation post-procedure. COMPARISON:  MRI/MRCP on 04/04/2021 FINDINGS: Imaging with a C-arm demonstrates cannulation of the common bile duct with evidence of common bile duct dilatation with contrast injection that appears to extend into intrahepatic bile ducts. There may be component of distal CBD stricture. Balloon sweep maneuver was performed. No discrete filling defects identified. IMPRESSION: Dilatation of the common bile duct with potential component of distal CBD stricture. These images were submitted for radiologic interpretation only. Please see the procedural report for the amount of contrast and the fluoroscopy time utilized. Electronically Signed   By: Irish Lack M.D.   On: 04/06/2021 07:48   MR ABDOMEN MRCP W WO CONTAST  Result Date: 04/04/2021 CLINICAL DATA:  Jaundice. Evaluate for common bile duct stone. Diabetes. EXAM: MRI ABDOMEN WITHOUT AND WITH CONTRAST (INCLUDING MRCP) TECHNIQUE: Multiplanar multisequence MR imaging of the abdomen was performed both before and after the administration of intravenous contrast. Heavily T2-weighted images of the biliary and pancreatic ducts were obtained, and three-dimensional MRCP images were rendered by post processing. CONTRAST:  4mL GADAVIST GADOBUTROL 1 MMOL/ML IV SOLN COMPARISON:  Ultrasound 04/02/2021.  CT 04/02/2021. FINDINGS: Portions of exam are moderately motion degraded. Lower chest: Moderate bilateral pleural effusions with compressive atelectasis. Mild cardiomegaly. A pleural-based crescentric lesion along the inferior right hemidiaphragm is T1 hyperintense and measures 2.6 cm on 34/7. This is present back to 06/02/2018 and is therefore of doubtful clinical significance. Possibly a chronic hematoma given precontrast signal. Hepatobiliary: Extremely subtle areas of heterogeneous hyperenhancement are identified primarily on arterial phase imaging. Examples in the high left hepatic lobe 1.4 cm on 18/20, hepatic dome on 12/20, and high  right hepatic lobe on 20/20. Some of these are subtly apparent on T2 weighted series 7. Cholecystectomy. Moderate intrahepatic biliary duct dilatation, present back to 01/25/2020 CT. The common duct measures up to 1.3 cm in the porta hepatis on 10/15. Tapers relatively gradually to 9 mm just above the pancreatic head. At and just above the ampulla is a suggestion of somewhat more focal narrowing with possible T2 hypointensity at the ampulla. Example 23/4 and 7/15. Within the posterior mid common duct, 2 mm subtle T2 hypointensity on 06/15 and 6 26/13 is equivocal for tiny nonobstructive stone. Pancreas: Especially when compared to the CT of 01/25/2020, the pancreas is mildly edematous with peripancreatic edema. No necrosis or pancreatic duct dilatation. No peripancreatic fluid collection. Spleen:  Normal in size, without focal abnormality. Adrenals/Urinary Tract: Normal right adrenal gland. Left adrenal thickening. Bilateral renal cysts. Malrotated right kidney. No hydronephrosis. Stomach/Bowel: Grossly normal abdominal bowel loops and stomach. Vascular/Lymphatic: Aortic atherosclerosis. Patent  portal and splenic veins. No abdominal adenopathy. Other:  Perihepatic and perisplenic small volume ascites. Musculoskeletal: Convex left lumbar spine curvature with advanced spondylosis. IMPRESSION: 1. Moderately motion degraded exam. 2. Chronic biliary duct dilatation back to 01/25/2020. Narrowing at and just above the ampulla with suggestion of T2 hypointensity at the ampulla. Cannot exclude ampullary stricture or stone. If this 87 year old patient is a treatment candidate, consider ERCP. 3. Equivocal 2 mm mid common duct dependent stone, not a cause of obstruction. 4. Mild non complicated pancreatitis. 5. Subtle foci of rounded heterogeneous enhancement within the liver,a new compared to 01/25/2020 CT. In this patient with chronic biliary duct dilatation, developing abscesses are a concern. Suboptimally evaluated  secondary to motion on the current exam. 6. Bilateral pleural effusions and ascites, suggesting fluid overload. 7.  Aortic Atherosclerosis (ICD10-I70.0). Electronically Signed   By: Jeronimo Greaves M.D.   On: 04/04/2021 12:25

## 2021-04-06 NOTE — Plan of Care (Signed)

## 2021-04-06 NOTE — Progress Notes (Addendum)
Daily Rounding Note  04/06/2021, 12:05 PM  LOS: 4 days   SUBJECTIVE:   Chief complaint: Cholangitis.  Suspect passed CBD stone.  Physically doing well.  The urine is now yellow and not dark.  No abdominal pain.  No nausea. Cranky because she did not get any breakfast.  Apparently clear liquids were ordered and then a change of diet went into a soft diet which canceled the clear liquids and no tray was brought.  She is hungry.  No nausea.  OBJECTIVE:         Vital signs in last 24 hours:    Temp:  [97.1 F (36.2 C)-97.8 F (36.6 C)] 97.7 F (36.5 C) (04/18 0338) Pulse Rate:  [60-88] 64 (04/18 0600) Resp:  [14-28] 16 (04/18 0600) BP: (91-161)/(49-81) 111/49 (04/18 0338) SpO2:  [91 %-100 %] 96 % (04/18 0600) Last BM Date: 04/04/21 Filed Weights   04/02/21 1035 04/02/21 1839  Weight: 36.3 kg 39.8 kg   General: Aged, NAD, comfortable. Heart: RRR. Chest: Clear bilaterally. Abdomen: Soft.  Not distended.  Active bowel sounds.  Slight tenderness in the left upper quadrant very mild and no guarding or rebound. Urine in the Foley bag is pale yellow. Extremities: No CCE. Neuro/Psych: Oriented x3.  Moves all 4 limbs.  Intake/Output from previous day: 04/17 0701 - 04/18 0700 In: 1926.5 [P.O.:360; I.V.:984.1; IV Piggyback:582.4] Out: 1950 [Urine:1950]  Intake/Output this shift: Total I/O In: -  Out: 175 [Urine:175]  Lab Results: Recent Labs    04/04/21 0100 04/05/21 0539 04/05/21 1328 04/06/21 0331  WBC 12.9* 10.9*  --  3.1*  HGB 9.2* 10.9* 12.2 9.3*  HCT 28.8* 31.5* 36.0 27.3*  PLT 73* 72*  --  68*   BMET Recent Labs    04/04/21 0100 04/05/21 0539 04/05/21 1328 04/05/21 1738 04/05/21 2359 04/06/21 0331  NA 134* 135 138  --   --  132*  K 3.0* 2.4* 3.7 5.0 4.8 4.7  CL 107 103 100  --   --  104  CO2 18* 26  --   --   --  22  GLUCOSE 90 81 60*  --   --  193*  BUN '14 9 9  ' --   --  12  CREATININE 0.48  0.47 0.30*  --   --  0.56  CALCIUM 7.9* 8.1*  --   --   --  7.7*   LFT Recent Labs    04/04/21 0100 04/05/21 0539 04/06/21 0331  PROT 5.6* 5.2* 4.5*  ALBUMIN 1.7* 1.9* 1.7*  AST 92* 52* 43*  ALT 117* 100* 71*  ALKPHOS 126 108 252*  BILITOT 0.9 0.6 1.0   PT/INR Recent Labs    04/04/21 1454 04/05/21 0740  LABPROT 16.0* 14.3  INR 1.3* 1.1   Hepatitis Panel No results for input(s): HEPBSAG, HCVAB, HEPAIGM, HEPBIGM in the last 72 hours.  Studies/Results: DG ERCP  Result Date: 04/06/2021 CLINICAL DATA:  Biliary ductal dilatation. EXAM: ERCP TECHNIQUE: Multiple spot images obtained with the fluoroscopic device and submitted for interpretation post-procedure. COMPARISON:  MRI/MRCP on 04/04/2021 FINDINGS: Imaging with a C-arm demonstrates cannulation of the common bile duct with evidence of common bile duct dilatation with contrast injection that appears to extend into intrahepatic bile ducts. There may be component of distal CBD stricture. Balloon sweep maneuver was performed. No discrete filling defects identified. IMPRESSION: Dilatation of the common bile duct with potential component of distal CBD stricture. These images were  submitted for radiologic interpretation only. Please see the procedural report for the amount of contrast and the fluoroscopy time utilized. Electronically Signed   By: Aletta Edouard M.D.   On: 04/06/2021 07:48    ASSESMENT:   *   Ascending cholangitis.  Choledocholithiasis.  Previous cholecystectomy.  Chronic ductal dilatation. 04/05/2021 ERCP w sphincterotomy, no stones or sludge or pus encountered on balloon sweep.  Some sphincterotomy related bleeding noted and treated with balloon compression at the sphincterotomy site for 1 minute.  No bleeding seen at the end of procedure. Today's LFTs notable for alkaline phosphatase that is gone from 126 >> 252.  AST/ALT have gone from 92/117 >> 43/71.  T bili consistently normal.  *    Macrocytic anemia. Hgb 11.8 >>  9.2 >> 12.2 >> 9.3.  No blood transfusions during this admission  *    Thrombocytopenia.  Platelets 89 >> 68.  Normal-appearing spleen on ultrasound 04/02/2021.    PLAN   *   It is not alarming that the alk phos climbed following biliary ductal manipulation.   No signs of post ERCP pancreatitis. Recheck LFTs in AM.    *    agree with soft, easy to chew, diet.  Asked the nurse to get her some graham crackers and apple sauce to appease her hunger until the tray gets here.  *   ? duration of Zosyn.  Currently day 5 abx.       Azucena Freed  04/06/2021, 12:05 PM Phone [phone]   Attending physician's note   I have taken an interval history, reviewed the chart and examined the patient. I agree with the Advanced Practitioner's note, impression and recommendations.   S/p ERCP for suspected CBD stone and cholangitis, was unremarkable per Dr. Watt Climes.  She is currently hemodynamically stable  Alk phos is elevated, could be secondary to hepatic congestion in the setting of CHF Bilirubin within normal range. Pancytopenic, resolving sepsis  Plan for patient to transition to hospice care Difficult IV access DC Zosyn, can switch to oral Augmentin 875 mg twice daily to complete 7-day course of antibiotics Advance diet as tolerated  GI will sign off, is available if have any questions   I have spent 25 minutes of patient care (this includes precharting, chart review, review of results, face-to-face time used for counseling as well as treatment plan and follow-up. The patient was provided an opportunity to ask questions and all were answered. The patient agreed with the plan and demonstrated an understanding of the instructions.  Damaris Hippo , MD [phone]

## 2021-04-06 NOTE — Progress Notes (Addendum)
Daily Progress Note   Patient Name: [name]       Date: 04/06/2021 DOB: 05-Oct-1925  Age: 87 y.o. MRN#: [id number] Attending Physician: Thurnell Lose, MD Primary Care Physician: Neale Burly, MD Admit Date: 04/02/2021  Reason for Consultation/Follow-up: Establishing goals of care  Subjective: Chart review performed.  Received report from primary RN-no acute concerns.  RN reports that last night patient had episode of slurred speech, disorientation, and combativeness -after this episode hospitalist minimize medications including narcotics and benzos.  RN also reports that patient is having IV access issues at this time as well as continued agitation and hallucinations, which per RN seems to be baseline from nursing home.  Per RN patient ate 5 to 10% of her lunch tray.  Went to visit patient at bedside- son/Ray was present.  Patient was lying in bed awake, alert, disoriented, and IV team was attempting to establish new PIV.  Met with son/Ray in 2W conference room.  Reviewed interval history since PMT [place] discussions yesterday and provided updates.  Therapeutic listening provided as [name] reflects on patient's decline and how he has been trying to care and support her -he states this is its been difficult for him and his family.  [name] states the patient is "the most independent person he knows" and that "she would not want to live like this." We talked about transition to comfort measures in house and what that would entail inclusive of medications to control pain, dyspnea, agitation, nausea, itching, and hiccups. We discussed stopping all unnecessary measures such as blood draws, needle sticks, oxygen, antibiotics, CBGs/insulin, cardiac monitoring, and frequent vital signs.  After discussion  [name] was agreeable for transition to full comfort measures in house today and for patient to discharge back to [facility] with hospice as soon as care can be arranged.  He is clear that he wants to keep the patient comfortable and free from suffering for the time she has left -he wants to focus on keeping her calm and relaxed as he understands she has been restless, agitated, and combative.  Ray confirms patient's DNR/DNI status.  All questions and concerns addressed. Encouraged to call with questions and/or concerns. PMT card provided.   Length of Stay: 4  Current Medications: Scheduled Meds:  . antiseptic oral rinse  15 mL Topical  BID  . divalproex  125 mg Oral TID  . famotidine  20 mg Oral Daily  . mirtazapine  7.5 mg Oral QHS  . sodium chloride flush  3 mL Intravenous Q12H    Continuous Infusions:   PRN Meds: acetaminophen **OR** acetaminophen, glycopyrrolate **OR** glycopyrrolate **OR** glycopyrrolate, haloperidol **OR** haloperidol **OR** haloperidol lactate, HYDROcodone-acetaminophen, LORazepam **OR** LORazepam **OR** LORazepam, morphine injection, nitroGLYCERIN, ondansetron **OR** ondansetron (ZOFRAN) IV, polyvinyl alcohol  Physical Exam Vitals and nursing note reviewed.  Constitutional:      General: She is not in acute distress.    Appearance: She is cachectic. She is ill-appearing.  Pulmonary:     Effort: No respiratory distress.  Skin:    General: Skin is warm and dry.  Neurological:     Mental Status: She is alert. She is disoriented and confused.     Motor: Weakness present.  Psychiatric:        Behavior: Behavior is agitated.        Cognition and Memory: Cognition is impaired. Memory is impaired.             Vital Signs: BP (!) 111/49 (BP Location: Right Leg) Comment: not. nurse  Pulse 64   Temp 97.7 F (36.5 C) (Oral)   Resp 16   Ht '5\' 2"'  (1.575 m)   Wt 39.8 kg   SpO2 96%   BMI 16.05 kg/m  SpO2: SpO2: 96 % O2 Device: O2 Device: Nasal Cannula O2  Flow Rate: O2 Flow Rate (L/min): 2 L/min  Intake/output summary:   Intake/Output Summary (Last 24 hours) at 04/06/2021 1615 Last data filed at 04/06/2021 1500 Gross per 24 hour  Intake 1234.03 ml  Output 1350 ml  Net -115.97 ml   LBM: Last BM Date: 04/04/21 Baseline Weight: Weight: 36.3 kg Most recent weight: Weight: 39.8 kg       Palliative Assessment/Data: PPS 20%      Patient Active Problem List   Diagnosis Date Noted  . History of ERCP   . Elevated brain natriuretic peptide (BNP) level   . Ascending cholangitis   . Choledocholithiasis   . Abdominal pain   . Acute pancreatitis 04/02/2021  . LLQ pain   . Chest pain 10/09/2017  . Syncope 10/24/2015  . Faintness   . PFO (patent foramen ovale)   . Atrial fibrillation ([place])   . GERD (gastroesophageal reflux disease)   . COPD (chronic obstructive pulmonary disease) ([place])   . Hypothyroidism   . Insomnia 09/23/2011  . DEPRESSION, SITUATIONAL, ACUTE 04/20/2010  . RHEUMATIC HEART DISEASE 11/10/2009  . CHF 11/10/2009  . COPD 11/10/2009  . SPINAL STENOSIS 11/10/2009  . MITRAL VALVE REPLACEMENT, HX OF 11/10/2009    Palliative Care Assessment & Plan   Patient Profile: 87 y.o. female  with past medical history of COPD, diabetes, GERD, hypothyroidism, PFO, rheumatic heart disease status post mitral valve replacement, remote atrial fibrillation post Cox-Maze procedure maintaining sinus rhythm. She presented to [facility] emergency department on 04/02/2021 with complaint abdominal pain. She also reported weight loss and poor appetite. In the ED, CT abdomen pelvis significant for intra-/extra biliary ductal dilatation and pancreatic inflammation. Labs significant for elevated liver enzymes (alk phos 249, AST 897, ALT 434), total bilirubin 2.4 and lipase 472. Patient admitted to [facility] for further management of acute pancreatitis. She developed septic shock requiring transient treatment with levophed, and then transferred to [facility] on 4/15  for possible ERCP.   Assessment: Sepsis Acute cholangitis Acute pancreatitis CBD obstruction Thrombocytopenia Acute metabolic  encephalopathy Urinary retention Agitation Terminal care  Recommendations/Plan: . Initiated full comfort measures . Continue DNR/DNI as previously documented - copy of durable DNR form made and will be scanned into Vynca/ACP tab . Discharge back to [facility] with hospice as soon as care can be arranged - TOC notified . Added orders for EOL symptom management and to reflect full comfort measures, as well as discontinued orders that were not focused on comfort . Restarted patient's Remeron at bedtime and transitioned IV Depakote back to p.o. dose per home med list . Unrestricted visitation orders were placed per current [place] EOL visitation policy  . Nursing to provide frequent assessments and administer PRN medications as clinically necessary to ensure EOL comfort . PMT will continue to follow and support holistically   Goals of Care and Additional Recommendations:  Limitations on Scope of Treatment: Full Comfort Care  Code Status:    Code Status Orders  (From admission, onward)         Start     Ordered   04/06/21 1550  Do not attempt resuscitation (DNR)  Continuous       Question Answer Comment  In the event of cardiac or respiratory ARREST Do not call a "code blue"   In the event of cardiac or respiratory ARREST Do not perform Intubation, CPR, defibrillation or ACLS   In the event of cardiac or respiratory ARREST Use medication by any route, position, wound care, and other measures to relive pain and suffering. May use oxygen, suction and manual treatment of airway obstruction as needed for comfort.      04/06/21 1604        Code Status History    Date Active Date Inactive Code Status Order ID Comments User Context   04/02/2021 1849 04/06/2021 1604 DNR [id number]  Albertine Patricia, MD Inpatient   04/02/2021 1117 04/02/2021  1849 DNR [id number]  Fredia Sorrow, MD ED   10/09/2017 0812 10/09/2017 1649 Full Code [id number]  Oswald Hillock, MD ED   Advance Care Planning Activity       Prognosis:   < 6 months  Discharge Planning:  [place] with Hospice  Care plan was discussed with primary RN, patient's son, Dr. Candiss Norse, [facility]  Thank you for allowing the Palliative Medicine Team to assist in the care of this patient.   Total Time  45 minutes Prolonged Time Billed  no       Greater than 50%  of this time was spent counseling and coordinating care related to the above assessment and plan.  Lin Landsman, NP  Please contact Palliative Medicine Team phone at [phone] for questions and concerns.   *Please note that portions of this note are a verbal dictation therefore any spelling and/or grammatical errors are due to the "[employer] [employer]" system interpretation.

## 2021-04-06 NOTE — Progress Notes (Signed)
Pt c/o IV site burning. Fluids stopped at this time. IV team consulted. Gi at the bedside and reports IV zosyn can be d/c as pt completed therapy. Md notified and IV fluids with potassium may be d/c. Depokene can be switch to home dose PO. Pallative care with family now to talk about comfort care. Will await families decision.   1515: Family has decided for full comfort measures.

## 2021-04-06 NOTE — Progress Notes (Signed)
Used ultrasound to examine entire Right arm. Visualized arteries but no vein large large enough for any vascular access. RN aware

## 2021-04-06 NOTE — Progress Notes (Signed)
Patient at this time up to the chair, secure chat sent to RN to replace consult when the patient was placed back in the bed.

## 2021-04-06 NOTE — TOC Initial Note (Signed)
Transition of Care [facility]) - Initial/Assessment Note    Patient Details  Name: [name] MRN: [id number] Date of Birth: 1925/04/28  Transition of [facility]) CM/SW Contact:    Joanne Chars, LCSW Phone Number: 04/06/2021, 4:04 PM  Clinical Narrative:     CSW met with pt, pt son Ray and pt daughter in law.  Permission given to speak with son and daughter in law.  Discussed Hospice care at [facility], choice document given and they would like [facility]ospice or [place], son will be contact.  Pt is vaccinated for covid and boosted.  CSW spoke with [place] at [place], confirmed they do cover [place], and made referral. CSW spoke with Ebony Hail at [facility] who confirmed pt can return to [place] care there with Hospice.  They can receive her tomorrow.               Expected Discharge Plan: Long Term Nursing Home Barriers to Discharge: Continued Medical Work up   Patient Goals and CMS Choice Patient states their goals for this hospitalization and ongoing recovery are:: comfort CMS Medicare.gov Compare Post Acute Care list provided to:: Patient Represenative (must comment) Choice offered to / list presented to : Adult Children  Expected Discharge Plan and Services Expected Discharge Plan: Long Term Nursing Home In-house Referral: Clinical Social Work   Post Acute Care Choice: Hospice Living arrangements for the past 2 months: [place] (LTC)                             [place]: [facility] Date [place]: 04/06/21 Time [place]: 1422 Representative spoke with at [place]: [place]  Prior Living Arrangements/Services Living arrangements for the past 2 months: [place] ([place]) Lives with:: Facility Resident Patient language and need for interpreter reviewed:: Yes Do you feel safe going back to the place where you live?: Yes      Need for Family Participation in Patient Care:  Yes (Comment) Care giver support system in place?: Yes (comment)   Criminal Activity/Legal Involvement Pertinent to Current Situation/Hospitalization: No - Comment as needed  Activities of Daily Living Home Assistive Devices/Equipment: Environmental consultant (specify type) ADL Screening (condition at time of admission) Patient's cognitive ability adequate to safely complete daily activities?: Yes Is the patient deaf or have difficulty hearing?: No Does the patient have difficulty seeing, even when wearing glasses/contacts?: No Does the patient have difficulty concentrating, remembering, or making decisions?: No Patient able to express need for assistance with ADLs?: Yes Does the patient have difficulty dressing or bathing?: No Independently performs ADLs?: Yes (appropriate for developmental age) Does the patient have difficulty walking or climbing stairs?: Yes Weakness of Legs: Both Weakness of Arms/Hands: Both  Permission Sought/Granted Permission sought to share information with : Family Supports Permission granted to share information with : Yes, Verbal Permission Granted  Share Information with NAME: son Ray  Permission granted to share info w AGENCY: [facility]        Emotional Assessment Appearance:: Appears stated age Attitude/Demeanor/Rapport: Engaged Affect (typically observed): Appropriate Orientation: : Oriented to Self Alcohol / Substance Use: Not Applicable Psych Involvement: No (comment)  Admission diagnosis:  Acute pancreatitis [K85.90] Generalized abdominal pain [R10.84] Abdominal pain [R10.9] Acute pancreatitis without infection or necrosis, unspecified pancreatitis type [K85.90] Patient Active Problem List   Diagnosis Date Noted  . History of ERCP   . Elevated brain natriuretic peptide (  BNP) level   . Ascending cholangitis   . Choledocholithiasis   . Abdominal pain   . Acute pancreatitis 04/02/2021  . LLQ pain   . Chest pain 10/09/2017  . Syncope 10/24/2015  .  Faintness   . PFO (patent foramen ovale)   . Atrial fibrillation ([place])   . GERD (gastroesophageal reflux disease)   . COPD (chronic obstructive pulmonary disease) ([place])   . Hypothyroidism   . Insomnia 09/23/2011  . DEPRESSION, SITUATIONAL, ACUTE 04/20/2010  . RHEUMATIC HEART DISEASE 11/10/2009  . CHF 11/10/2009  . COPD 11/10/2009  . SPINAL STENOSIS 11/10/2009  . MITRAL VALVE REPLACEMENT, HX OF 11/10/2009   PCP:  Neale Burly, MD Pharmacy:   [facility], [place] [address] [name] [address] Phone: [phone] Fax: [phone]  [facility] [place], [place] [address] Phone: [phone] Fax: [phone]  [facility] [address] [place] [address] Phone: [phone] Fax: [phone]     Social Determinants of Health (SDOH) Interventions    Readmission Risk Interventions Readmission Risk Prevention Plan 04/03/2021  Transportation Screening Complete  Medication Review (RN CM) Complete  Some recent data might be hidden

## 2021-04-06 NOTE — Progress Notes (Signed)
Physical Therapy Treatment Patient Details Name: [name] MRN: [id number] DOB: 09-Oct-1925 Today's Date: 04/06/2021    History of Present Illness THis 87 y.o. female admitted to [facility] with abdominal pain.  CT of abdomen showed pancreatic inflammation due to likey acute pancreatitis.  she developed septic shock likely due to intra-abdominal infection and cholangitis.  She was transferred to [facility] for ERCP.  PMH includes: rheumatic heart disease, PFO, DM, degenerative lumbar spinal stenosis, COPD, CHF, A-FIB, s/p MVR    PT Comments    Pt admitted with above diagnosis. Pt was able to take pivotal steps to recliner today with mod assist with RW.  Pt with heavy posterior lean and needs incr cues and assist for stability.  Decr pts frequency to 2x week as this is appropriate frequency for pts d/c plan.  Pt currently with functional limitations due to balance and endurance deficits. Pt will benefit from skilled PT to increase their independence and safety with mobility to allow discharge to the venue listed below.     Follow Up Recommendations  SNF     Equipment Recommendations  None recommended by PT    Recommendations for Other Services       Precautions / Restrictions Precautions Precautions: Fall Precaution Comments: per pt, she has a h/o falls, but she is a poor historian Restrictions Weight Bearing Restrictions: No    Mobility  Bed Mobility Overal bed mobility: Needs Assistance Bed Mobility: Supine to Sit     Supine to sit: Min assist;Mod assist     General bed mobility comments: requires increased time, and asisst to scoot hips to the EOB    Transfers Overall transfer level: Needs assistance Equipment used: Rolling walker (2 wheeled) Transfers: Sit to/from [employer] Sit to Stand: Mod assist Stand pivot transfers: Mod assist       General transfer comment: assistance to power up into standing.  She demonstrates heavy posterior lean and is fearful  of following with transfer. Tried to sit prematurely several times needing incr assist and cues.  Ambulation/Gait             General Gait Details: Could not advance gait   Stairs             Wheelchair Mobility    Modified Rankin (Stroke Patients Only)       Balance Overall balance assessment: Needs assistance Sitting-balance support: No upper extremity supported Sitting balance-Leahy Scale: Fair Sitting balance - Comments: able to maintain static sitting with min guard assist   Standing balance support: Bilateral upper extremity supported Standing balance-Leahy Scale: Poor Standing balance comment: requires assist for balance and bil. UE support                            Cognition Arousal/Alertness: Awake/alert Behavior During Therapy: WFL for tasks assessed/performed Overall Cognitive Status: No family/caregiver present to determine baseline cognitive functioning                                 General Comments: Pt is a poor historian and provides inconsistent responses frequently      Exercises General Exercises - Lower Extremity Ankle Circles/Pumps: AROM;Both;10 reps;Supine Long Arc Quad: AROM;Both;10 reps;Seated Hip Flexion/Marching: AROM;Both;10 reps;Seated    General Comments General comments (skin integrity, edema, etc.): VSS with pt on 2LO2      Pertinent Vitals/Pain Pain Assessment: No/denies pain  Home Living                      Prior Function            PT Goals (current goals can now be found in the care plan section) Acute Rehab PT Goals Patient Stated Goal: Pt didn't state Progress towards PT goals: Progressing toward goals    Frequency    Min 2X/week      PT Plan Current plan remains appropriate;Frequency needs to be updated    Co-evaluation              AM-PAC PT "6 Clicks" Mobility   Outcome Measure  Help needed turning from your back to your side while in a flat bed  without using bedrails?: None Help needed moving from lying on your back to sitting on the side of a flat bed without using bedrails?: A Little Help needed moving to and from a bed to a chair (including a wheelchair)?: A Lot Help needed standing up from a chair using your arms (e.g., wheelchair or bedside chair)?: A Lot Help needed to walk in hospital room?: A Lot Help needed climbing 3-5 steps with a railing? : A Lot 6 Click Score: 15    End of Session Equipment Utilized During Treatment: Gait belt;Oxygen Activity Tolerance: Patient limited by fatigue Patient left: in chair;with call bell/phone within reach (No chair alarms on floor. Nursing secretary Made aware and ordering chair alarms.) Nurse Communication: Mobility status PT Visit Diagnosis: Unsteadiness on feet (R26.81);Other abnormalities of gait and mobility (R26.89);Muscle weakness (generalized) (M62.81)     Time: 1009-1030 PT Time Calculation (min) (ACUTE ONLY): 21 min  Charges:  $Therapeutic Activity: 8-22 mins                     Kayelynn Abdou M,PT Acute Rehab Services [phone] (pager)   Bevelyn Buckles 04/06/2021, 1:55 PM

## 2021-04-07 ENCOUNTER — Encounter (HOSPITAL_COMMUNITY): Payer: Self-pay | Admitting: Gastroenterology

## 2021-04-07 DIAGNOSIS — R7989 Other specified abnormal findings of blood chemistry: Secondary | ICD-10-CM

## 2021-04-07 LAB — CULTURE, BLOOD (ROUTINE X 2)
Culture: NO GROWTH
Culture: NO GROWTH
Special Requests: ADEQUATE

## 2021-04-07 LAB — SARS CORONAVIRUS 2 (TAT 6-24 HRS): SARS Coronavirus 2: NEGATIVE

## 2021-04-07 MED ORDER — TAMSULOSIN HCL 0.4 MG PO CAPS: 0.4000 mg | ORAL_CAPSULE | Freq: Every day | ORAL | 0 refills | Status: AC

## 2021-04-07 NOTE — TOC Progression Note (Signed)
Transition of Care [facility]) - Progression Note    Patient Details  Name: [name] MRN: [id number] Date of Birth: Jun 19, 1925  Transition of Care [facility]) CM/SW Contact  Lorri Frederick, LCSW Phone Number: 04/07/2021, 10:50 AM  Clinical Narrative:  CSW informed by Revonda Standard at [facility] that the [place] location has a Community education officer with [facility] and hospice would need to be through them rather than [facility].  CSW spoke with son Rosalia Hammers who is OK with this change. CSW spoke with [place] at [facility] and informed her, referral made to Chrislyn at [facility] who confirms that they can provide services in [place].      Expected Discharge Plan: Long Term Nursing Home Barriers to Discharge: Continued Medical Work up  Expected Discharge Plan and Services Expected Discharge Plan: Long Term Nursing Home In-house Referral: Clinical Social Work   Post Acute Care Choice: Hospice Living arrangements for the past 2 months: Skilled Nursing Facility (LTC) Expected Discharge Date: 04/07/21                           [facility] Agency: [facility] Date HH Agency Contacted: 04/06/21 Time HH Agency Contacted: 1422 Representative spoke with at [facility] Agency: Lelon Mast   Social Determinants of Health (SDOH) Interventions    Readmission Risk Interventions Readmission Risk Prevention Plan 04/03/2021  Transportation Screening Complete  Medication Review (RN CM) Complete  Some recent data might be hidden

## 2021-04-07 NOTE — NC FL2 (Signed)
MEDICAID FL2 LEVEL OF CARE SCREENING TOOL     IDENTIFICATION  Patient Name: [name] Birthdate: May 18, 1925 Sex: female Admission Date (Current Location): 04/02/2021  [facility] and [place] Number:  Producer, television/film/video and Address:  [facility], [address] [place], [place] [address]      Provider Number: [id number]  Attending Physician Name and Address:  Leroy Sea, MD  Relative Name and Phone Number:  Christen Bame [phone]  [phone]    Current Level of Care: Hospital Recommended Level of Care: Skilled Nursing Facility Prior Approval Number:    Date Approved/Denied:   PASRR Number: [id number]  Discharge Plan: Other (Comment) (Long term care)    Current Diagnoses: Patient Active Problem List   Diagnosis Date Noted  . History of ERCP   . Elevated brain natriuretic peptide (BNP) level   . Ascending cholangitis   . Choledocholithiasis   . Abdominal pain   . Acute pancreatitis 04/02/2021  . LLQ pain   . Chest pain 10/09/2017  . Syncope 10/24/2015  . Faintness   . PFO (patent foramen ovale)   . Atrial fibrillation (HCC)   . GERD (gastroesophageal reflux disease)   . COPD (chronic obstructive pulmonary disease) (HCC)   . Hypothyroidism   . Insomnia 09/23/2011  . DEPRESSION, SITUATIONAL, ACUTE 04/20/2010  . RHEUMATIC HEART DISEASE 11/10/2009  . CHF 11/10/2009  . COPD 11/10/2009  . SPINAL STENOSIS 11/10/2009  . MITRAL VALVE REPLACEMENT, HX OF 11/10/2009    Orientation RESPIRATION BLADDER Height & Weight     Self,Place  Normal Incontinent Weight: 87 lb 11.9 oz (39.8 kg) Height:  5\' 2"  (157.5 cm)  BEHAVIORAL SYMPTOMS/MOOD NEUROLOGICAL BOWEL NUTRITION STATUS      Incontinent Diet (Soft diet.  See discharge summary)  AMBULATORY STATUS COMMUNICATION OF NEEDS Skin   Total Care Verbally Other (Comment) (ecchymosis)                       Personal Care Assistance Level of Assistance   Bathing,Feeding,Dressing Bathing Assistance: Limited assistance Feeding assistance: Limited assistance Dressing Assistance: Maximum assistance     Functional Limitations Info  Sight,Hearing,Speech Sight Info: Impaired Hearing Info: Adequate Speech Info: Adequate    SPECIAL CARE FACTORS FREQUENCY                       Contractures Contractures Info: Not present    Additional Factors Info  Code Status,Allergies Code Status Info: DNR Allergies Info: Sulfonamide Derivatives, Aspirin           Current Medications (04/07/2021):  This is the current hospital active medication list Current Facility-Administered Medications  Medication Dose Route Frequency Provider Last Rate Last Admin  . acetaminophen (TYLENOL) tablet 650 mg  650 mg Oral Q6H PRN 04/09/2021, NP   650 mg at 04/07/21 04/09/21   Or  . acetaminophen (TYLENOL) suppository 650 mg  650 mg Rectal Q6H PRN 1884, NP      . antiseptic oral rinse (BIOTENE) solution 15 mL  15 mL Topical BID Haskel Khan, NP   15 mL at 04/07/21 0948  . divalproex (DEPAKOTE) DR tablet 125 mg  125 mg Oral TID 04/09/21, NP   125 mg at 04/07/21 0948  . famotidine (PEPCID) tablet 20 mg  20 mg Oral Daily 04/09/21, MD   20 mg at 04/07/21 0947  . glycopyrrolate (ROBINUL) tablet 1 mg  1 mg Oral Q4H PRN Haskel Khan, NP       Or  . glycopyrrolate (ROBINUL) injection 0.2 mg  0.2 mg Subcutaneous Q4H PRN Haskel Khan, NP       Or  . glycopyrrolate (ROBINUL) injection 0.2 mg  0.2 mg Intravenous Q4H PRN Girard Cooter M, NP      . haloperidol (HALDOL) tablet 2 mg  2 mg Oral Q6H PRN Haskel Khan, NP   2 mg at 04/07/21 0948   Or  . haloperidol (HALDOL) 2 MG/ML solution 2 mg  2 mg Sublingual Q6H PRN Haskel Khan, NP       Or  . haloperidol lactate (HALDOL) injection 2 mg  2 mg Intravenous Q6H PRN Haskel Khan, NP   2 mg at 04/06/21 1625  . HYDROcodone-acetaminophen (NORCO/VICODIN) 5-325 MG per tablet 1 tablet  1 tablet Oral  Q6H PRN Haskel Khan, NP   1 tablet at 04/07/21 0947  . LORazepam (ATIVAN) tablet 1 mg  1 mg Oral Q1H PRN Haskel Khan, NP       Or  . LORazepam (ATIVAN) 2 MG/ML concentrated solution 1 mg  1 mg Sublingual Q1H PRN Haskel Khan, NP       Or  . LORazepam (ATIVAN) injection 1 mg  1 mg Intravenous Q1H PRN Girard Cooter M, NP      . mirtazapine (REMERON) tablet 7.5 mg  7.5 mg Oral QHS Girard Cooter M, NP   7.5 mg at 04/06/21 2338  . morphine 2 MG/ML injection 2 mg  2 mg Intravenous Q2H PRN Haskel Khan, NP   2 mg at 04/06/21 2027  . nitroGLYCERIN (NITROSTAT) SL tablet 0.4 mg  0.4 mg Sublingual Q5 min PRN Vida Rigger, MD   0.4 mg at 04/04/21 1359  . ondansetron (ZOFRAN-ODT) disintegrating tablet 4 mg  4 mg Oral Q6H PRN Haskel Khan, NP       Or  . ondansetron [facility]) injection 4 mg  4 mg Intravenous Q6H PRN Haskel Khan, NP      . polyvinyl alcohol (LIQUIFILM TEARS) 1.4 % ophthalmic solution 1 drop  1 drop Both Eyes QID PRN Girard Cooter M, NP      . sodium chloride flush (NS) 0.9 % injection 3 mL  3 mL Intravenous Q12H Vida Rigger, MD   3 mL at 04/07/21 1914     Discharge Medications: Please see discharge summary for a list of discharge medications.  Relevant Imaging Results:  Relevant Lab Results:   Additional Information SSN [id number].  Pt is vaccinated for covid and boosted.  Lorri Frederick, LCSW

## 2021-04-07 NOTE — Progress Notes (Signed)
Civil engineer, contracting [facility])  Received request from [facility] for hospice services at [facility] after discharge.  Chart and pt information under review by [facility] physician.  Hospice eligibility pending at this time.  [place] send signed and completed DNR home with pt/family.  Please provide prescriptions at discharge as needed to ensure ongoing symptom management until pt can be admitted onto hospice services.    Thank you for the opportunity to participate in this pt's care.  Gillian Scarce, BSN, RN [employer] [phone] (24h on call)

## 2021-04-07 NOTE — Progress Notes (Addendum)
Daily Progress Note   Patient Name: [name]       Date: 04/07/2021 DOB: 1925/05/23  Age: 87 y.o. MRN#: [id number] Attending Physician: Thurnell Lose, MD Primary Care Physician: Neale Burly, MD Admit Date: 04/02/2021  Reason for Consultation/Follow-up: Non pain symptom management, Pain control, Psychosocial/spiritual support and Terminal Care  Subjective: Chart review performed.  Received report from primary RN-no acute concerns.  RN reports patient remains confused and symptoms of agitation and combativeness have improved with Haldol.   Went to visit patient at bedside-no family/visitors present.  Patient was lying in bed asleep-I did not attempt to wake her. No signs or non-verbal gestures of pain or discomfort noted. No respiratory distress, increased work of breathing, or secretions noted.   Length of Stay: 5  Current Medications: Scheduled Meds:  . antiseptic oral rinse  15 mL Topical BID  . divalproex  125 mg Oral TID  . famotidine  20 mg Oral Daily  . mirtazapine  7.5 mg Oral QHS  . sodium chloride flush  3 mL Intravenous Q12H    Continuous Infusions:   PRN Meds: acetaminophen **OR** acetaminophen, glycopyrrolate **OR** glycopyrrolate **OR** glycopyrrolate, haloperidol **OR** haloperidol **OR** haloperidol lactate, HYDROcodone-acetaminophen, LORazepam **OR** LORazepam **OR** LORazepam, morphine injection, nitroGLYCERIN, ondansetron **OR** ondansetron (ZOFRAN) IV, polyvinyl alcohol  Physical Exam Vitals and nursing note reviewed.  Constitutional:      General: She is not in acute distress.    Appearance: She is cachectic. She is ill-appearing.  Pulmonary:     Effort: No respiratory distress.  Skin:    General: Skin is warm and dry.  Neurological:      Mental Status: She is lethargic.     Motor: Weakness present.             Vital Signs: BP (!) 112/51 (BP Location: Right Arm)   Pulse 69   Temp 98 F (36.7 C) (Oral)   Resp 14   Ht $R'5\' 2"'Kr$  (1.575 m)   Wt 39.8 kg   SpO2 96%   BMI 16.05 kg/m  SpO2: SpO2: 96 % O2 Device: O2 Device: Room Air O2 Flow Rate: O2 Flow Rate (L/min): 2 L/min  Intake/output summary:   Intake/Output Summary (Last 24 hours) at 04/07/2021 1259 Last data filed at 04/06/2021 2020 Gross per 24 hour  Intake 253.[place]  ml  Output 500 ml  Net -246.69 ml   LBM: Last BM Date: 04/06/21 Baseline Weight: Weight: 36.3 kg Most recent weight: Weight: 39.8 kg       Palliative Assessment/Data: PPS 20%.      Patient Active Problem List   Diagnosis Date Noted  . History of ERCP   . Elevated brain natriuretic peptide (BNP) level   . Ascending cholangitis   . Choledocholithiasis   . Abdominal pain   . Acute pancreatitis 04/02/2021  . LLQ pain   . Chest pain 10/09/2017  . Syncope 10/24/2015  . Faintness   . PFO (patent foramen ovale)   . Atrial fibrillation ([facility])   . GERD (gastroesophageal reflux disease)   . COPD (chronic obstructive pulmonary disease) ([place])   . Hypothyroidism   . Insomnia 09/23/2011  . DEPRESSION, SITUATIONAL, ACUTE 04/20/2010  . RHEUMATIC HEART DISEASE 11/10/2009  . CHF 11/10/2009  . COPD 11/10/2009  . SPINAL STENOSIS 11/10/2009  . MITRAL VALVE REPLACEMENT, HX OF 11/10/2009    Palliative Care Assessment & Plan   Patient Profile: 87 y.o.femalewith past medical history of COPD, diabetes, GERD, hypothyroidism, PFO, rheumatic heart disease status post mitral valve replacement, remote atrial fibrillation post Cox-Maze procedure maintaining sinus rhythm. [facility].She also reported weight loss and poor appetite. In the ED, CT abdomen pelvis significant for intra-/extra biliary ductal dilatation and pancreatic  inflammation. Labs significant for elevated liver enzymes (alk phos 249, AST 897, ALT 434), total bilirubin 2.4 and lipase 472. Patient admitted to [facility] for further management of acute pancreatitis.She developed septic shock requiring transient treatment with levophed, and then transferred to [facility] on 4/15 for possible ERCP.  Assessment: Sepsis Acute cholangitis Acute pancreatitis CBD obstruction Thrombocytopenia Acute metabolic encephalopathy Urinary retention Agitation Terminal care  Recommendations/Plan:  Continue full comfort measures  Continue DNR/DNI as previously documented  Discharge back to [facility] with hospice as soon as care can be arranged -Per TOC note patient will likely discharge today  Continue current comfort focused medications-patient appears comfortable  Nursing to provide frequent assessments and administer PRN medications as clinically necessary to ensure EOL comfort  PMT will continue to follow and support holistically  Goals of Care and Additional Recommendations:  Limitations on Scope of Treatment: Full Comfort Care  Code Status:    Code Status Orders  (From admission, onward)         Start     Ordered   04/06/21 1550  Do not attempt resuscitation (DNR)  Continuous       Question Answer Comment  In the event of cardiac or respiratory ARREST Do not call a "code blue"   In the event of cardiac or respiratory ARREST Do not perform Intubation, CPR, defibrillation or ACLS   In the event of cardiac or respiratory ARREST Use medication by any route, position, wound care, and other measures to relive pain and suffering. May use oxygen, suction and manual treatment of airway obstruction as needed for comfort.      04/06/21 1604        Code Status History    Date Active Date Inactive Code Status Order ID Comments User Context   04/02/2021 1849 04/06/2021 1604 DNR [id number]  Albertine Patricia, MD Inpatient   04/02/2021 1117 04/02/2021 1849 DNR  [id number]  Fredia Sorrow, MD ED   10/09/2017 0812 10/09/2017 1649 Full Code [id number]  Oswald Hillock, MD ED   Advance Care Planning Activity  Prognosis:   < 6 months  Discharge Planning:  [place] with Hospice  Care plan was discussed with primary RN  Thank you for allowing the Palliative Medicine Team to assist in the care of this patient.   Total Time  17 minutes Prolonged Time Billed  no       Greater than 50%  of this time was spent counseling and coordinating care related to the above assessment and plan.  Lin Landsman, NP  Please contact Palliative Medicine Team phone at [phone] for questions and concerns.   *Please note that portions of this note are a verbal dictation therefore any spelling and/or grammatical errors are due to the "[employer]" system interpretation.

## 2021-04-07 NOTE — TOC Transition Note (Signed)
Transition of Care [facility]) - CM/SW Discharge Note   Patient Details  Name: [name] MRN: [id number] Date of Birth: [birth year]  [facility] CM/SW Contact:  Lorri Frederick, LCSW Phone Number: 04/07/2021, 10:59 AM   Clinical Narrative:   Pt discharging to long term care at [facility] with [facility].  RN call report to [phone].      Final next level of care: Home w Hospice Care Barriers to Discharge: Barriers Resolved   Patient Goals and CMS Choice Patient states their goals for this hospitalization and ongoing recovery are:: comfort CMS Medicare.gov Compare Post Acute Care list provided to:: Patient Represenative (must comment) Choice offered to / list presented to : Adult Children  Discharge Placement                Patient to be transferred to facility by: PTAR Name of family member notified: son Ray Patient and family notified of of transfer: 04/07/21  Discharge Plan and Services In-house Referral: Clinical Social Work   Post Acute Care Choice: Hospice                    HH Arranged:  (Home Hospice) [facility] Agency: [facility] Date [facility] Agency Contacted: 04/07/21 Time HH Agency Contacted: 1059 Representative spoke with at [facility] Agency: Chrislyn  Social Determinants of Health (SDOH) Interventions     Readmission Risk Interventions Readmission Risk Prevention Plan 04/03/2021  Transportation Screening Complete  Medication Review (RN CM) Complete  Some recent data might be hidden

## 2021-04-07 NOTE — Discharge Instructions (Signed)
Disposition. SNF with Pall.Care/Hospice Condition.  Guarded CODE STATUS.  DNR Activity.  With assistance as tolerated, full fall precautions. Diet.  Soft with feeding assistance and aspiration precautions. Goal of care.  Comfort.

## 2021-04-07 NOTE — Discharge Summary (Addendum)
[name]:[id number] DOB: 06/28/1925 DOA: 04/02/2021  PCP: Toma Deiters, MD  Admit date: 04/02/2021  Discharge date: 04/07/2021  Admitted From: Home   Disposition:  SNF   Recommendations for Outpatient Follow-up:   Follow up with PCP in 1-2 weeks  PCP Please obtain BMP/CBC, 2 view CXR in 1week,  (see Discharge instructions)   PCP Please follow up on the following pending results:    Home Health: None   Equipment/Devices: None  Consultations: GI, Pall.Care Discharge Condition: Stable    CODE STATUS: DNR   Diet Recommendation: Soft  Diet Order            DIET SOFT Room service appropriate? Yes; Fluid consistency: Thin  Diet effective now                  Chief Complaint  Patient presents with  . Abdominal Pain     Brief history of present illness from the day of admission and additional interim summary    87 year old nursing home resident admitted with abdominal pain . She reports black stools for "1 year" and just feeling bad.  Her work-up was consistent with possible CBD obstruction along with some pancreatic inflammation, she was admitted by critical care seen by GI and transferred to hospitalist service on 04/05/2021 more day 3 of her hospital stay.                                                                 Hospital Course      1.  Sepsis due to acute cholangitis & pancreatitis   with  CBD obstruction.  She has been treated and stabilized in ICU, was placed on IV Zosyn along with gentle IV fluids.  Sepsis pathophysiology has resolved.    She underwent MRCP and subsequently was seen by [facility] she then underwent ERCP on 04/06/2019 with CBD sphincterectomy and obstruction removal, LFTs and lipase much improved, now on soft diet, due to advanced age and frail status palliative care  was involved and after discussions with family it was decided that patient would be best served with gentle medical treatment directed towards comfort.  If there is any further significant decline hospice.  2.  Thrombocytopenia due to sepsis.  Gradually improving. No signs of bleeding.  Lab Results  Component Value Date   PLT 68 (L) 04/06/2021    3.  Chronic back pain.  Supportive care, avoided narcotics as she developed some encephalopathy.  At [facility] management as appropriate, goal of care is comfort now.  4.  Hypertension.  Continue home regimen.  5.  GERD.  On Pepcid.  6.  Hypothyroidism.  Continue Synthroid at home dose.  7.  Severe hypokalemia and hypomagnesemia.  Replaced.  8.  Metabolic encephalopathy night of 04/05/2021.  Minimize narcotics  and benzodiazepines, use Haldol if needed.   9.  Urinary retention.  Had Foley placed on 04/04/2021.  Added Flomax.    Will discharge on Foley catheter, can be removed in 1 to 2 days at SNF with close monitoring of postvoid residual.    10. DM type II.    Home regimen.  Discharge diagnosis     Active Problems:   RHEUMATIC HEART DISEASE   MITRAL VALVE REPLACEMENT, HX OF   GERD (gastroesophageal reflux disease)   COPD (chronic obstructive pulmonary disease) (HCC)   Hypothyroidism   Acute pancreatitis   Abdominal pain   Ascending cholangitis   Choledocholithiasis   History of ERCP   Elevated brain natriuretic peptide (BNP) level    Discharge instructions    Discharge Instructions    Discharge instructions   Complete by: As directed    Disposition. SNF with Pall.Care/Hospice Condition.  Guarded CODE STATUS.  DNR Activity.  With assistance as tolerated, full fall precautions. Diet.  Soft with feeding assistance and aspiration precautions. Goal of care.  Comfort.   Increase activity slowly   Complete by: As directed       Discharge Medications   Allergies as of 04/07/2021      Reactions   Sulfonamide Derivatives  Anaphylaxis, Shortness Of Breath, Swelling   Aspirin    Uncoated Aspirin = stomach upset/pain EC Aspirin is ok      Medication List    STOP taking these medications   lidocaine 5 % Commonly known as: LIDODERM     TAKE these medications   acetaminophen 500 MG tablet Commonly known as: TYLENOL Take 500 mg by mouth every 6 (six) hours as needed.   cholecalciferol 25 MCG (1000 UNIT) tablet Commonly known as: VITAMIN D Take 1 tablet by mouth daily.   divalproex 125 MG DR tablet Commonly known as: DEPAKOTE Take 125 mg by mouth 3 (three) times daily.   famotidine 20 MG tablet Commonly known as: PEPCID Take 20 mg by mouth daily.   ferrous sulfate 325 (65 FE) MG tablet Take 325 mg by mouth 2 (two) times daily with a meal.   Flonase Sensimist 27.5 MCG/SPRAY nasal spray Generic drug: fluticasone Place 1 spray into the nose daily.   guaiFENesin-dextromethorphan 100-10 MG/5ML syrup Commonly known as: ROBITUSSIN DM Take 10 mLs by mouth every 4 (four) hours as needed for cough.   levothyroxine 100 MCG tablet Commonly known as: SYNTHROID Take 100 mcg by mouth daily before breakfast.   loratadine 10 MG tablet Commonly known as: CLARITIN Take 10 mg by mouth daily.   magnesium hydroxide 400 MG/5ML suspension Commonly known as: MILK OF MAGNESIA Take 30 mLs by mouth daily as needed for mild constipation.   melatonin 5 MG Tabs Take 5 mg by mouth at bedtime.   metoprolol succinate 25 MG 24 hr tablet Commonly known as: TOPROL-XL Take 1 tablet (25 mg total) by mouth daily. What changed:   how much to take  additional instructions   mirtazapine 7.5 MG tablet Commonly known as: REMERON Take 7.5 mg by mouth at bedtime.   OLANZapine 2.5 MG tablet Commonly known as: ZYPREXA Take 1.25 mg by mouth at bedtime.   senna 8.6 MG tablet Commonly known as: SENOKOT Take 2 tablets by mouth daily.   tamsulosin 0.4 MG Caps capsule Commonly known as: Flomax Take 1 capsule (0.4 mg  total) by mouth daily after supper.   tizanidine 2 MG capsule Commonly known as: ZANAFLEX Take 2 mg by mouth at bedtime.  Major procedures and Radiology Reports - PLEASE review detailed and final reports thoroughly  -       CT ABDOMEN PELVIS WO CONTRAST  Result Date: 04/02/2021 CLINICAL DATA:  Left lower quadrant pain for 2-3 days. EXAM: CT ABDOMEN AND PELVIS WITHOUT CONTRAST TECHNIQUE: Multidetector CT imaging of the abdomen and pelvis was performed following the standard protocol without IV contrast. COMPARISON:  CT angiogram of the chest, abdomen and pelvis 10/09/2017. FINDINGS: Lower chest: Mild cardiomegaly. Small pleural effusions, greater on the left. Dependent atelectasis. Hepatobiliary: No focal liver lesion is identified on this unenhanced exam. Intra and extrahepatic biliary ductal dilatation appears worse than on the prior CT. The gallbladder has been removed. Pancreas: The body and tail the pancreas are markedly atrophic. The head and neck of the pancreas appear swollen with surrounding stranding. Spleen: Normal in size without focal abnormality. Adrenals/Urinary Tract: Abnormal axis of orientation of the right kidney is unchanged. 2-3 cysts are seen in the lower pole the left kidney, also unchanged. The kidneys are otherwise unremarkable. The bladder is markedly distended. No urinary tract stones are seen. Adrenal glands are negative. Stomach/Bowel: Stomach is within normal limits. The appendix is not visualized but no pericecal inflammatory change is seen. No evidence of bowel wall thickening, distention, or inflammatory changes. Surgical anastomosis in the sigmoid colon is identified as on the prior study. Vascular/Lymphatic: Aortic atherosclerosis. No enlarged abdominal or pelvic lymph nodes. Reproductive: Status post hysterectomy. No adnexal masses. Other: There is a small volume of perihepatic ascites. Mesenteric edema is noted. No focal fluid collection is seen.  Musculoskeletal: Convex left scoliosis and multilevel degenerative change noted. No acute or focal abnormality. IMPRESSION: Intra and extrahepatic biliary ductal dilatation is new since the prior examination. Cause for the obstruction is not seen and could be due to a stone which is occult on CT or possibly pancreatic neoplasm. Right upper quadrant ultrasound could be used to check for an occult common bile duct stone. Ill-defined and edematous appearing head of the pancreas with surrounding stranding is likely due to pancreatitis. Underlying neoplasm is possible. Marked distention of the urinary bladder. Electronically Signed   By: Drusilla Kanner M.D.   On: 04/02/2021 14:55   US Abdomen Complete  Result Date: 04/02/2021 CLINICAL DATA:  Possible biliary obstruction, abdominal pain for 2-3 days. Status post cholecystectomy. EXAM: ABDOMEN ULTRASOUND COMPLETE COMPARISON:  CT abdomen pelvis 04/02/2021 FINDINGS: Gallbladder: Status post cholecystectomy. Common bile duct: Diameter: 15 mm. Liver: No focal lesion identified. Within normal limits in parenchymal echogenicity. Portal vein is patent on color Doppler imaging with normal direction of blood flow towards the liver. IVC: No abnormality visualized. Pancreas: Visualized portion unremarkable. Spleen: Size and appearance within normal limits. Right Kidney: Length: 9.9 cm. Cortical thinning and scarring. Echogenicity within normal limits. No mass or hydronephrosis visualized. Left Kidney: Length: 9.5 cm. There is a 2.3 x 1.6 x 2.1 cm cystic lesion within the left kidney. There is another 4.3 x 3.4 x 3.4 cm cystic lesion. Echogenicity within normal limits. No mass or hydronephrosis visualized. Abdominal aorta: No aneurysm visualized. Other findings: None. IMPRESSION: Common bile duct dilatation in a patient status post cholecystectomy. Given CBD measures up to 15 mm, consider MRCP if clinically indicated. Electronically Signed   By: Tish Frederickson M.D.   On:  04/02/2021 17:15   DG Chest Port 1 View  Result Date: 04/02/2021 CLINICAL DATA:  Chest pain. EXAM: PORTABLE CHEST 1 VIEW COMPARISON:  December 28, 2020. FINDINGS: The heart size and mediastinal  contours are within normal limits. No pneumothorax or pleural effusion is noted. Stable interstitial densities are noted throughout both lungs most consistent with scarring. No acute abnormality is noted. Stable right apical scarring is noted. The visualized skeletal structures are unremarkable. IMPRESSION: No active disease. Aortic Atherosclerosis (ICD10-I70.0). Electronically Signed   By: Lupita Raider M.D.   On: 04/02/2021 11:34   DG ERCP  Result Date: 04/06/2021 CLINICAL DATA:  Biliary ductal dilatation. EXAM: ERCP TECHNIQUE: Multiple spot images obtained with the fluoroscopic device and submitted for interpretation post-procedure. COMPARISON:  MRI/MRCP on 04/04/2021 FINDINGS: Imaging with a C-arm demonstrates cannulation of the common bile duct with evidence of common bile duct dilatation with contrast injection that appears to extend into intrahepatic bile ducts. There may be component of distal CBD stricture. Balloon sweep maneuver was performed. No discrete filling defects identified. IMPRESSION: Dilatation of the common bile duct with potential component of distal CBD stricture. These images were submitted for radiologic interpretation only. Please see the procedural report for the amount of contrast and the fluoroscopy time utilized. Electronically Signed   By: Irish Lack M.D.   On: 04/06/2021 07:48   MR ABDOMEN MRCP W WO CONTAST  Result Date: 04/04/2021 CLINICAL DATA:  Jaundice. Evaluate for common bile duct stone. Diabetes. EXAM: MRI ABDOMEN WITHOUT AND WITH CONTRAST (INCLUDING MRCP) TECHNIQUE: Multiplanar multisequence MR imaging of the abdomen was performed both before and after the administration of intravenous contrast. Heavily T2-weighted images of the biliary and pancreatic ducts were  obtained, and three-dimensional MRCP images were rendered by post processing. CONTRAST:  75mL GADAVIST GADOBUTROL 1 MMOL/ML IV SOLN COMPARISON:  Ultrasound 04/02/2021.  CT 04/02/2021. FINDINGS: Portions of exam are moderately motion degraded. Lower chest: Moderate bilateral pleural effusions with compressive atelectasis. Mild cardiomegaly. A pleural-based crescentric lesion along the inferior right hemidiaphragm is T1 hyperintense and measures 2.6 cm on 34/7. This is present back to 06/02/2018 and is therefore of doubtful clinical significance. Possibly a chronic hematoma given precontrast signal. Hepatobiliary: Extremely subtle areas of heterogeneous hyperenhancement are identified primarily on arterial phase imaging. Examples in the high left hepatic lobe 1.4 cm on 18/20, hepatic dome on 12/20, and high right hepatic lobe on 20/20. Some of these are subtly apparent on T2 weighted series 7. Cholecystectomy. Moderate intrahepatic biliary duct dilatation, present back to 01/25/2020 CT. The common duct measures up to 1.3 cm in the porta hepatis on 10/15. Tapers relatively gradually to 9 mm just above the pancreatic head. At and just above the ampulla is a suggestion of somewhat more focal narrowing with possible T2 hypointensity at the ampulla. Example 23/4 and 7/15. Within the posterior mid common duct, 2 mm subtle T2 hypointensity on 06/15 and 6 26/13 is equivocal for tiny nonobstructive stone. Pancreas: Especially when compared to the CT of 01/25/2020, the pancreas is mildly edematous with peripancreatic edema. No necrosis or pancreatic duct dilatation. No peripancreatic fluid collection. Spleen:  Normal in size, without focal abnormality. Adrenals/Urinary Tract: Normal right adrenal gland. Left adrenal thickening. Bilateral renal cysts. Malrotated right kidney. No hydronephrosis. Stomach/Bowel: Grossly normal abdominal bowel loops and stomach. Vascular/Lymphatic: Aortic atherosclerosis. Patent portal and splenic  veins. No abdominal adenopathy. Other:  Perihepatic and perisplenic small volume ascites. Musculoskeletal: Convex left lumbar spine curvature with advanced spondylosis. IMPRESSION: 1. Moderately motion degraded exam. 2. Chronic biliary duct dilatation back to 01/25/2020. Narrowing at and just above the ampulla with suggestion of T2 hypointensity at the ampulla. Cannot exclude ampullary stricture or stone. If this 87 year old patient  is a treatment candidate, consider ERCP. 3. Equivocal 2 mm mid common duct dependent stone, not a cause of obstruction. 4. Mild non complicated pancreatitis. 5. Subtle foci of rounded heterogeneous enhancement within the liver,a new compared to 01/25/2020 CT. In this patient with chronic biliary duct dilatation, developing abscesses are a concern. Suboptimally evaluated secondary to motion on the current exam. 6. Bilateral pleural effusions and ascites, suggesting fluid overload. 7.  Aortic Atherosclerosis (ICD10-I70.0). Electronically Signed   By: Jeronimo Greaves M.D.   On: 04/04/2021 12:25    Micro Results     Recent Results (from the past 240 hour(s))  Culture, blood (routine x 2)     Status: None   Collection Time: 04/02/21  6:04 PM   Specimen: BLOOD LEFT HAND  Result Value Ref Range Status   Specimen Description BLOOD LEFT HAND  Final   Special Requests   Final    BOTTLES DRAWN AEROBIC ONLY Blood Culture results may not be optimal due to an inadequate volume of blood received in culture bottles   Culture   Final    NO GROWTH 5 DAYS Performed at [facility], [address], [place], [place] [address]    Report Status 04/07/2021 FINAL  Final  MRSA PCR Screening     Status: None   Collection Time: 04/02/21  6:48 PM   Specimen: Nasal Mucosa; Nasopharyngeal  Result Value Ref Range Status   MRSA by PCR NEGATIVE NEGATIVE Final    Comment:        The GeneXpert MRSA Assay (FDA approved for NASAL specimens only), is one component of a comprehensive MRSA  colonization surveillance program. It is not intended to diagnose MRSA infection nor to guide or monitor treatment for MRSA infections. Performed at [facility], [address], [place], [place] [address]   Culture, blood (routine x 2)     Status: None   Collection Time: 04/02/21  8:12 PM   Specimen: BLOOD LEFT HAND  Result Value Ref Range Status   Specimen Description   Final    BLOOD LEFT HAND Performed at [facility], [address] [place], [place] [address]    Special Requests   Final    BOTTLES DRAWN AEROBIC ONLY Blood Culture adequate volume   Culture   Final    NO GROWTH 5 DAYS Performed at [facility], [address], [place], [place] [address]    Report Status 04/07/2021 FINAL  Final  SARS CORONAVIRUS 2 (TAT 6-24 HRS) Nasopharyngeal Nasopharyngeal Swab     Status: None   Collection Time: 04/04/21  5:08 PM   Specimen: Nasopharyngeal Swab  Result Value Ref Range Status   SARS Coronavirus 2 NEGATIVE NEGATIVE Final    Comment: (NOTE) SARS-CoV-2 target nucleic acids are NOT DETECTED.  The SARS-CoV-2 RNA is generally detectable in upper and lower respiratory specimens during the acute phase of infection. Negative results do not preclude SARS-CoV-2 infection, do not rule out co-infections with other pathogens, and should not be used as the sole basis for treatment or other patient management decisions. Negative results must be combined with clinical observations, patient history, and epidemiological information. The expected result is Negative.  Fact Sheet for Patients: HairSlick.no  Fact Sheet for Healthcare Providers: quierodirigir.com  This test is not yet approved or cleared by the [place] FDA and  has been authorized for detection and/or diagnosis of SARS-CoV-2 by FDA under an Emergency Use Authorization (EUA). This EUA will remain  in effect (meaning this test can  be used) for the duration of  the COVID-19 declaration under Se ction 564(b)(1) of the Act, 21 U.S.C. section 360bbb-3(b)(1), unless the authorization is terminated or revoked sooner.  Performed at [facility], [address], [place], [place] [address]   SARS CORONAVIRUS 2 (TAT 6-24 HRS) Nasopharyngeal Nasopharyngeal Swab     Status: None   Collection Time: 04/06/21  4:05 PM   Specimen: Nasopharyngeal Swab  Result Value Ref Range Status   SARS Coronavirus 2 NEGATIVE NEGATIVE Final    Comment: (NOTE) SARS-CoV-2 target nucleic acids are NOT DETECTED.  The SARS-CoV-2 RNA is generally detectable in upper and lower respiratory specimens during the acute phase of infection. Negative results do not preclude SARS-CoV-2 infection, do not rule out co-infections with other pathogens, and should not be used as the sole basis for treatment or other patient management decisions. Negative results must be combined with clinical observations, patient history, and epidemiological information. The expected result is Negative.  Fact Sheet for Patients: HairSlick.no  Fact Sheet for Healthcare Providers: quierodirigir.com  This test is not yet approved or cleared by the [place] FDA and  has been authorized for detection and/or diagnosis of SARS-CoV-2 by FDA under an Emergency Use Authorization (EUA). This EUA will remain  in effect (meaning this test can be used) for the duration of the COVID-19 declaration under Se ction 564(b)(1) of the Act, 21 U.S.C. section 360bbb-3(b)(1), unless the authorization is terminated or revoked sooner.  Performed at [facility], [address], [place], [place] [address]     Today   Subjective    [name] today has no headache,no chest abdominal pain,no new weakness tingling or numbness, feels much better    Objective   Blood pressure (!) 114/52, pulse 69, temperature 98 F (36.7 C), temperature  source Oral, resp. rate 19, height 5\' 2"  (1.575 m), weight 39.8 kg, SpO2 98 %.   Intake/Output Summary (Last 24 hours) at 04/07/2021 0847 Last data filed at 04/06/2021 2020 Gross per 24 hour  Intake 581.45 ml  Output 675 ml  Net -93.55 ml    Exam  Awake but mildly confused, in no distress, no focal deficits [place].AT,PERRAL Supple Neck,No JVD, No cervical lymphadenopathy appriciated.  Symmetrical Chest wall movement, Good air movement bilaterally, CTAB RRR,No Gallops,Rubs or new Murmurs, No Parasternal Heave +ve B.Sounds, Abd Soft, Non tender, No organomegaly appriciated, No rebound -guarding or rigidity. No Cyanosis, Clubbing or edema, No new Rash or bruise   Data Review   CBC w Diff:  Lab Results  Component Value Date   WBC 3.1 (L) 04/06/2021   HGB 9.3 (L) 04/06/2021   HCT 27.3 (L) 04/06/2021   PLT 68 (L) 04/06/2021   LYMPHOPCT 5 04/02/2021   BANDSPCT 14 04/02/2021   MONOPCT 0 04/02/2021   EOSPCT 0 04/02/2021   BASOPCT 0 04/02/2021    CMP:  Lab Results  Component Value Date   NA 132 (L) 04/06/2021   K 4.7 04/06/2021   CL 104 04/06/2021   CO2 22 04/06/2021   BUN 12 04/06/2021   CREATININE 0.56 04/06/2021   PROT 4.5 (L) 04/06/2021   ALBUMIN 1.7 (L) 04/06/2021   BILITOT 1.0 04/06/2021   ALKPHOS 252 (H) 04/06/2021   AST 43 (H) 04/06/2021   ALT 71 (H) 04/06/2021  .   Total Time in preparing paper work, data evaluation and todays exam - 35 minutes  Susa Raring M.D on 04/07/2021 at 8:47 AM  [facility]

## 2021-04-08 NOTE — Care Management Important Message (Signed)
Important Message  Patient Details  Name: [name] MRN: [id number] Date of Birth: 09/18/1925   Medicare Important Message Given:  Yes  Patient left prior to IM delivery IM mailed to the patient home address.    Traveion Ruddock 04/08/2021, 9:16 AM

## 2021-10-20 DEATH — deceased
